# Patient Record
Sex: Male | Born: 1945
Health system: Southern US, Community
[De-identification: ages and names within clinical notes are randomized; demographics above are authoritative.]

## PROBLEM LIST (undated history)

## (undated) DIAGNOSIS — E785 Hyperlipidemia, unspecified: Secondary | ICD-10-CM

## (undated) DIAGNOSIS — F250 Schizoaffective disorder, bipolar type: Secondary | ICD-10-CM

## (undated) DIAGNOSIS — H359 Unspecified retinal disorder: Secondary | ICD-10-CM

## (undated) DIAGNOSIS — H269 Unspecified cataract: Secondary | ICD-10-CM

## (undated) DIAGNOSIS — I219 Acute myocardial infarction, unspecified: Secondary | ICD-10-CM

## (undated) DIAGNOSIS — Z87442 Personal history of urinary calculi: Secondary | ICD-10-CM

## (undated) HISTORY — PX: HEMORROIDECTOMY: SUR656

## (undated) HISTORY — DX: Schizoaffective disorder, bipolar type: F25.0

## (undated) HISTORY — PX: CARDIAC CATHETERIZATION: SHX172

## (undated) HISTORY — DX: Acute myocardial infarction, unspecified: I21.9

## (undated) HISTORY — DX: Hyperlipidemia, unspecified: E78.5

---

## 2001-10-22 ENCOUNTER — Encounter: Admission: RE | Admit: 2001-10-22 | Discharge: 2001-10-22 | Payer: Self-pay | Admitting: Family Medicine

## 2001-10-22 ENCOUNTER — Encounter: Payer: Self-pay | Admitting: Family Medicine

## 2002-01-14 ENCOUNTER — Encounter: Payer: Self-pay | Admitting: Emergency Medicine

## 2002-01-14 ENCOUNTER — Emergency Department (HOSPITAL_COMMUNITY): Admission: EM | Admit: 2002-01-14 | Discharge: 2002-01-14 | Payer: Self-pay | Admitting: Emergency Medicine

## 2002-01-14 IMAGING — CT CT HEAD W/O CM
1 series · 16 of 30 positions shown, 20 images · non-contrast
Comparison: none

FINDINGS
CLINICAL DATA: 56 YEAR OLD WITH BLURRED VISION. NO LOSS OF CONSCIOUSNESS.
CT HEAD, WITHOUT CONTRAST
AXIAL IMAGES ARE ACQUIRED THROUGH THE BRAIN AT 5 MM INCREMENTS WITHOUT CONTRAST.  THERE IS NO INTRA
OR EXTRA-AXIAL FLUID COLLECTION OR MASS.  BASILAR CISTERNS AND VENTRICLES HAVE NORMAL APPEARANCE.
THERE IS SUBTLE LOW ATTENUATION ADJACENT TO THE FRONTAL HORN OF THE RIGHT LATERAL VENTRICLE, WITHIN
THE FRONTAL LOBE, MEASURING LESS THAN 1 CM IN SIZE (IMAGES 9 AND 10).  THOUGH THE FINDINGS COULD BE
RELATED TO PERIVENTRICULAR WHITE MATTER CHANGE OF SMALL VESSEL DISEASE, THERE ARE A FEW OTHER
CHANGES ELSEWHERE TO SUPPORT SMALL VESSEL DISEASE.  IN LIGHT OF THIS ABNORMALITY, A CONTRAST
ENHANCED MRI MAY BE HELPFUL FOR FURTHER EVALUATION.
THERE ARE CHANGES OF CHRONIC SINUSITIS.
IMPRESSION
SUBTLE AREA OF LOW ATTENUATION IN THE RIGHT FRONTAL LOBE ADJACENT TO THE ANTERIOR HORN OF THE RIGHT
LATERAL VENTRICLE. THIS COULD REPRESENT CHANGE OF SMALL VESSEL DISEASE.  HOWEVER, MRI MAY BE
HELPFUL FOR FURTHER EVALUATION.

[Series 2: brain · axial · 0.47mm/px · z∈[+88,+229]mm · 16 of 30 slices shown, 20 images]
[im 2/30  brain]
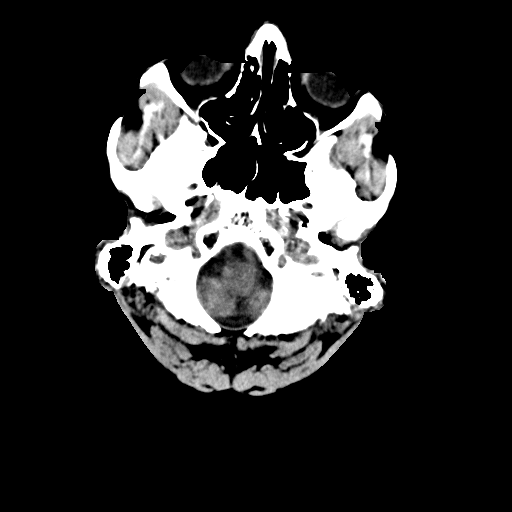
[im 2/30  bone]
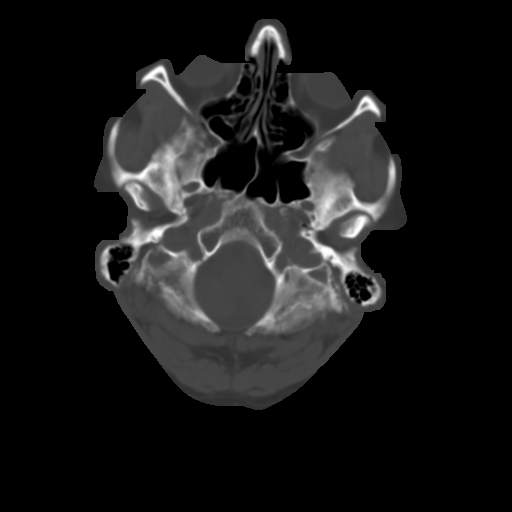
[im 4/30  brain]
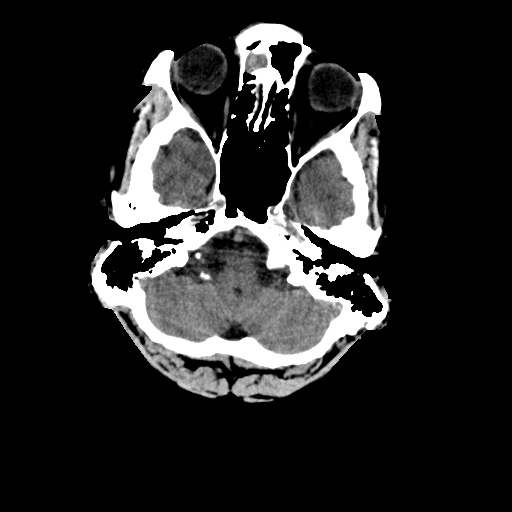
[im 6/30  brain]
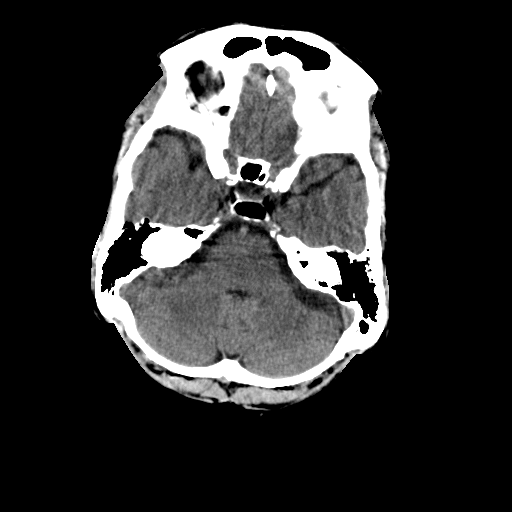
[im 8/30  brain]
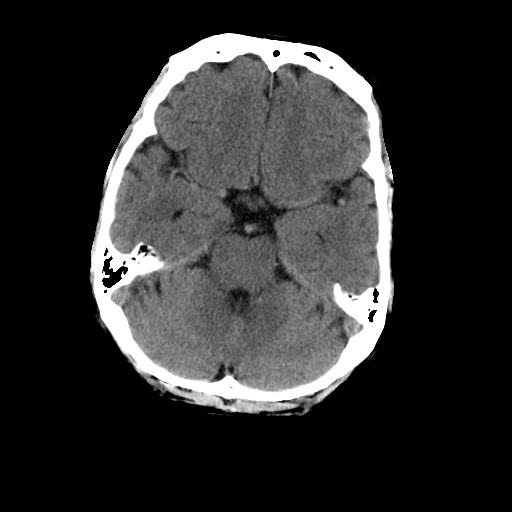
[im 9/30  brain]
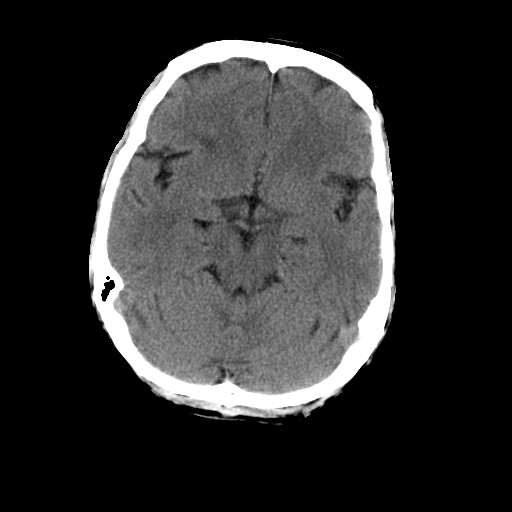
[im 9/30  bone]
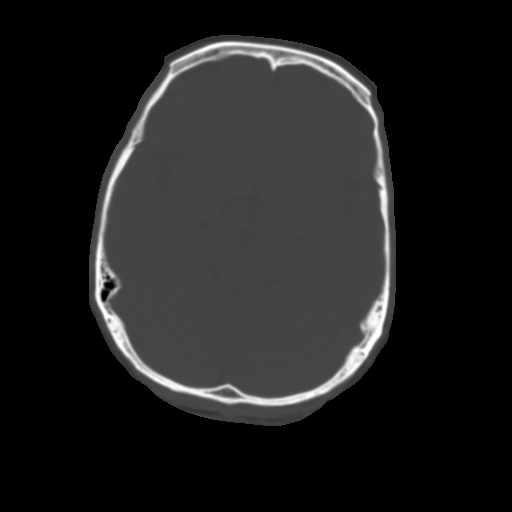
[im 11/30  brain]
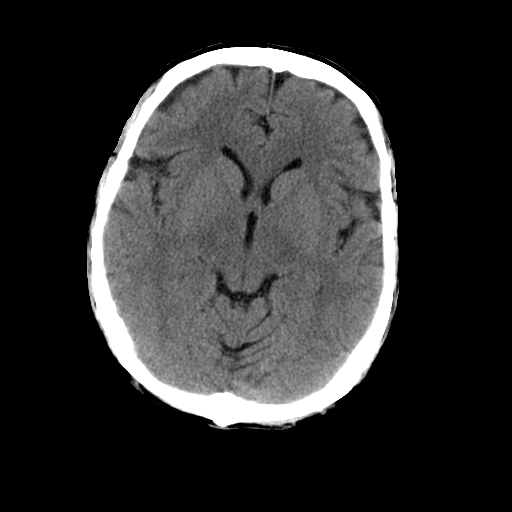
[im 13/30  brain]
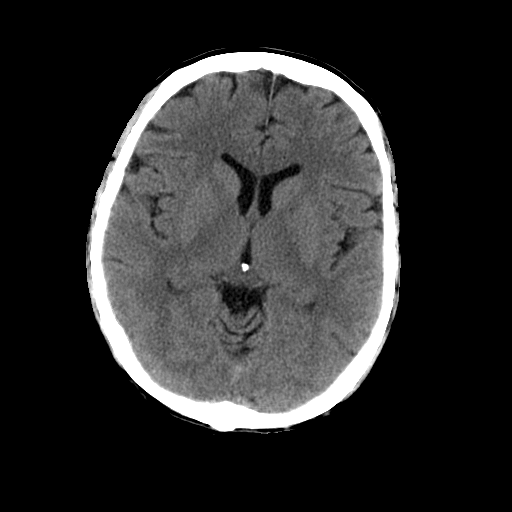
[im 15/30  brain]
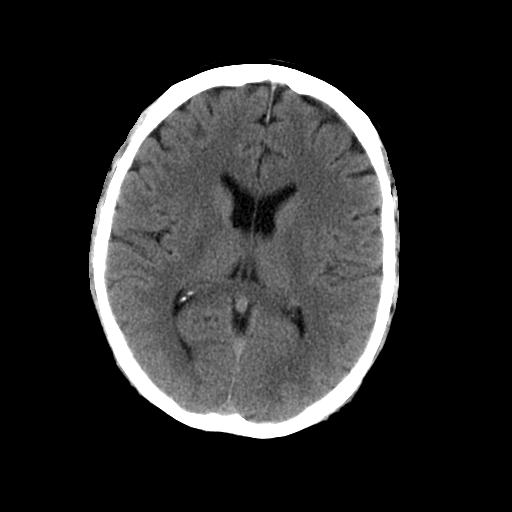
[im 16/30  brain]
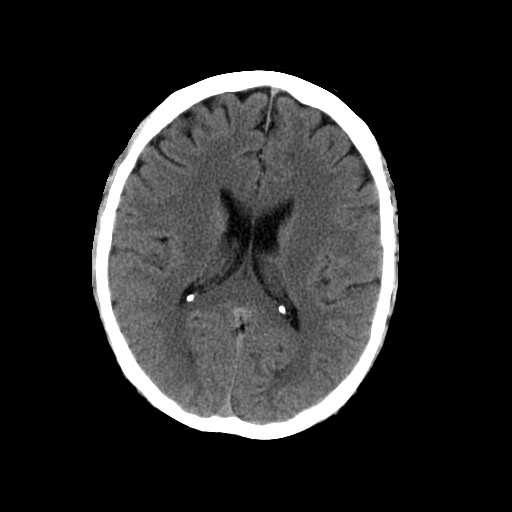
[im 16/30  bone]
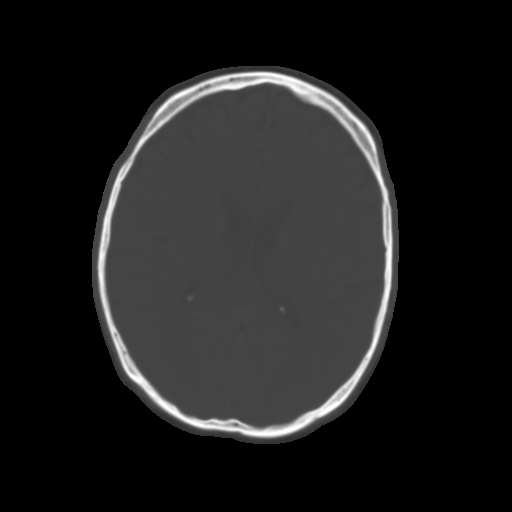
[im 18/30  brain]
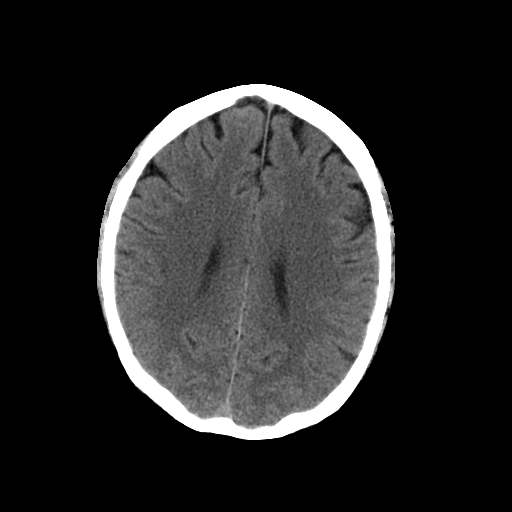
[im 20/30  brain]
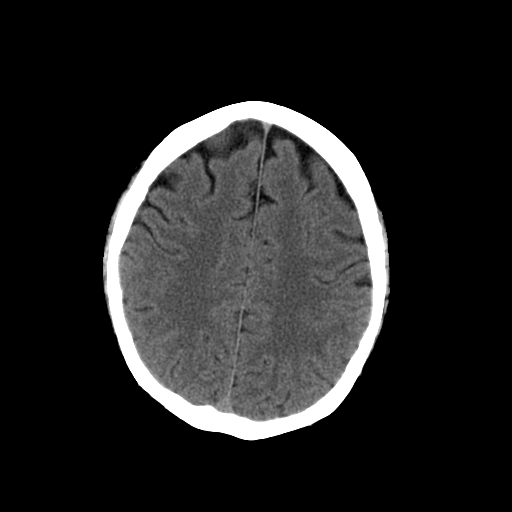
[im 22/30  brain]
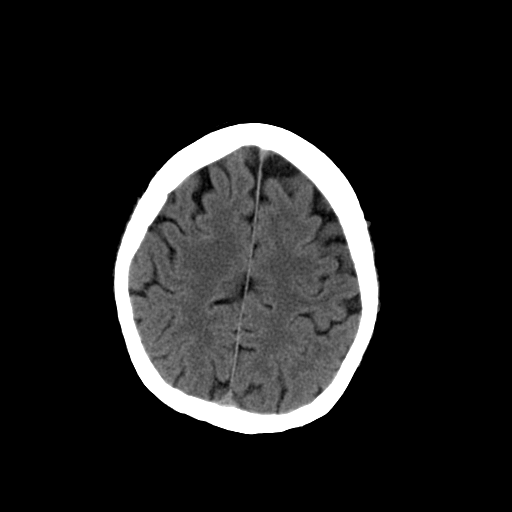
[im 23/30  brain]
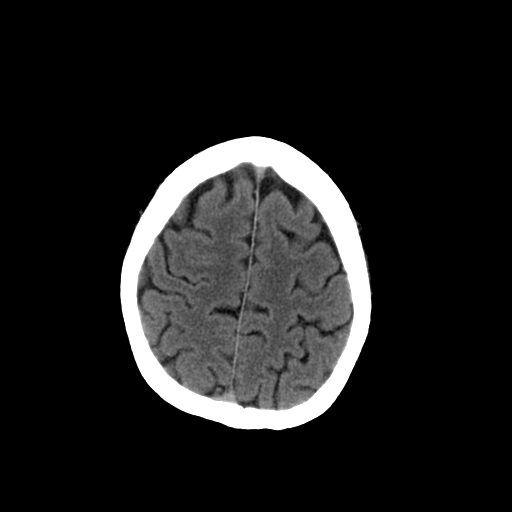
[im 23/30  bone]
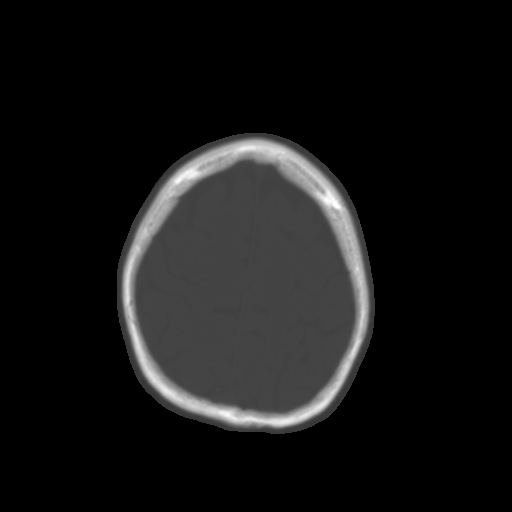
[im 25/30  brain]
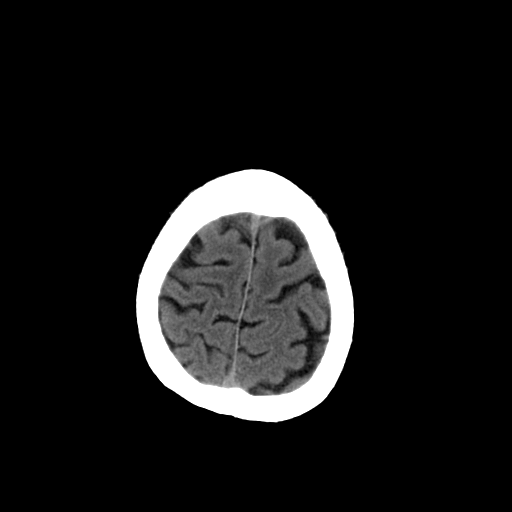
[im 27/30  brain]
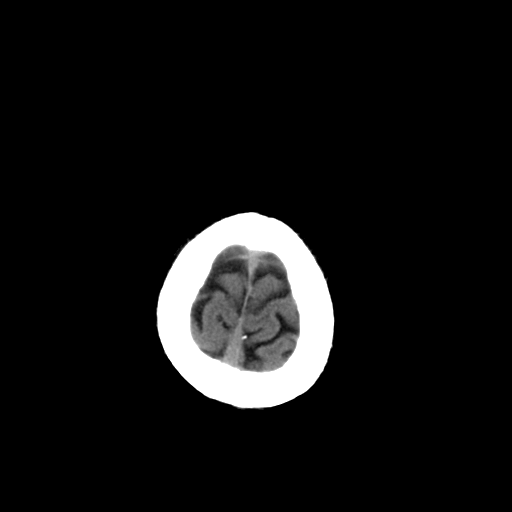
[im 29/30  brain]
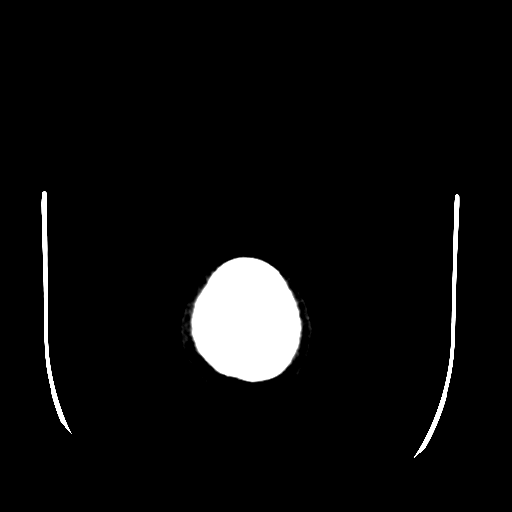

[16 of 30 positions shown; findings below may reference images not displayed]

## 2002-01-25 ENCOUNTER — Encounter: Payer: Self-pay | Admitting: Family Medicine

## 2002-01-25 ENCOUNTER — Encounter: Admission: RE | Admit: 2002-01-25 | Discharge: 2002-01-25 | Payer: Self-pay | Admitting: Family Medicine

## 2003-10-09 ENCOUNTER — Emergency Department (HOSPITAL_COMMUNITY): Admission: EM | Admit: 2003-10-09 | Discharge: 2003-10-09 | Payer: Self-pay | Admitting: Emergency Medicine

## 2003-10-09 IMAGING — CT CT HEAD W/O CM
1 of 2 series · 13 of 30 positions shown, 17 images · non-contrast
Comparison: none

CLINICAL DATA: 77-year-old, altered level of consciousness.
 NONCONTRAST CRANIAL CT
 This study was compared to previous study from [DATE].
 Intracranially, the ventricles are in the midline without mass effect or shift.  No extraaxial fluid collections are seen.  No CT evidence for acute intracranial abnormality.  No intracranial mass lesions.  In the right frontal white matter, there is a vague area of low attenuation which is more discrete than it was on the prior CT scan.  It is probably a lacunar type white matter infarct.  The bony calvarium is intact.  Visualized paranasal sinuses and mastoid air cells are clear.
 IMPRESSION 
 No acute intracranial findings.  Remote lacunar type infarct in the right frontal white matter.

[Series 2: brain · axial · 0.47mm/px · z∈[+121,+255]mm · 13 of 28 slices shown, 17 images]
[im 2/28  brain]
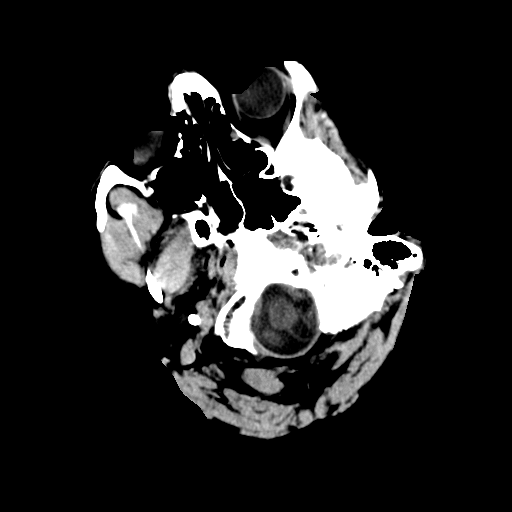
[im 2/28  bone]
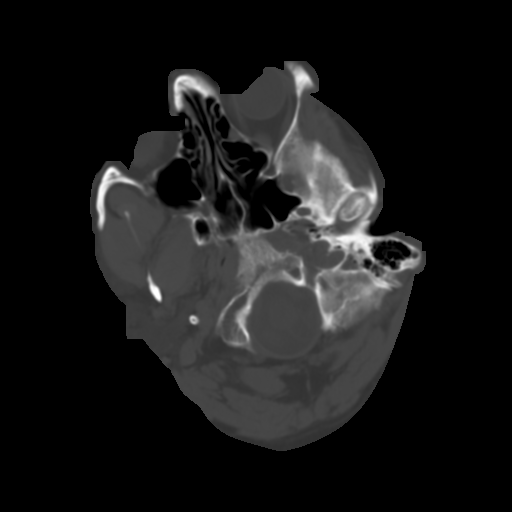
[im 4/28  brain]
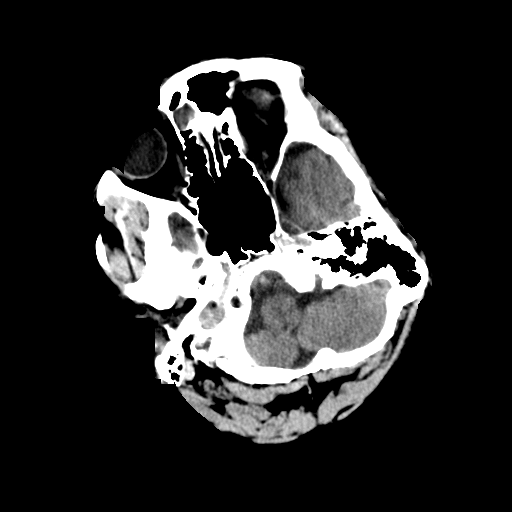
[im 6/28  brain]
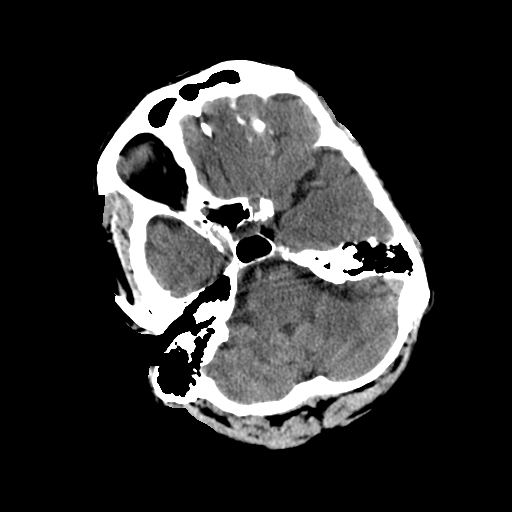
[im 8/28  brain]
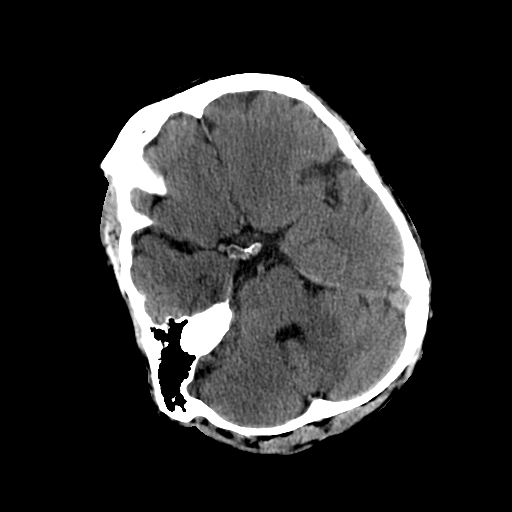
[im 10/28  brain]
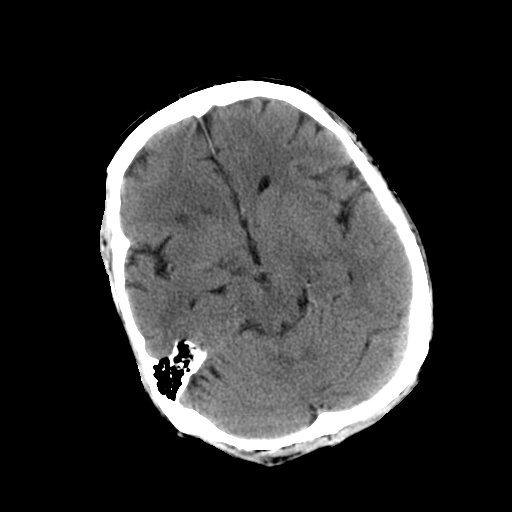
[im 10/28  bone]
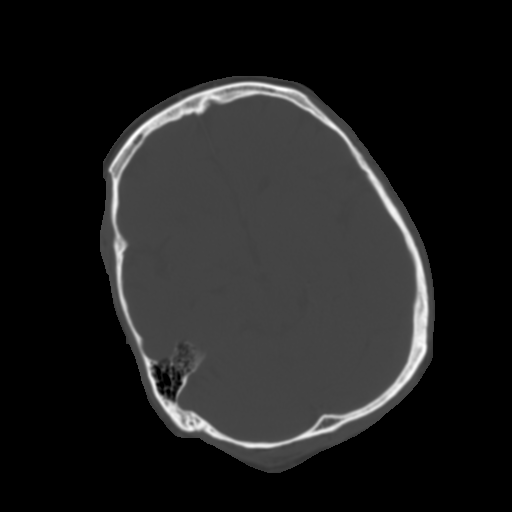
[im 12/28  brain]
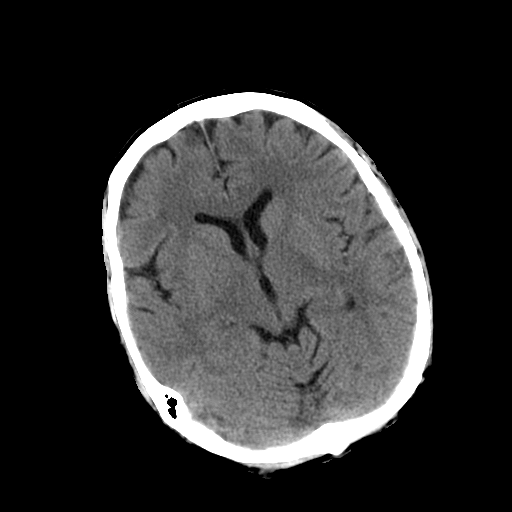
[im 14/28  brain]
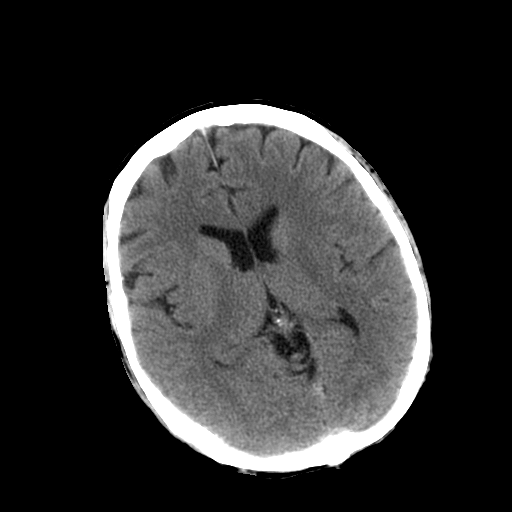
[im 16/28  brain]
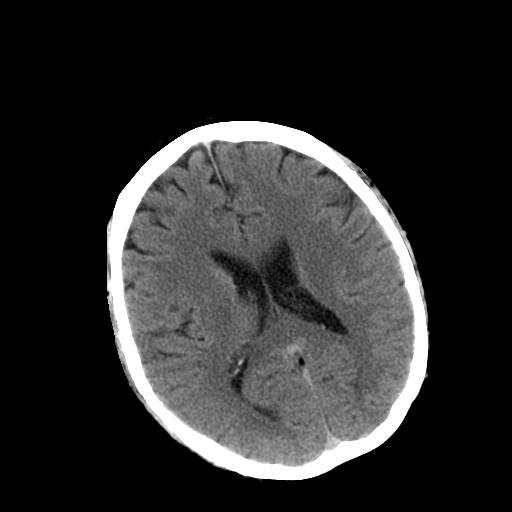
[im 18/28  brain]
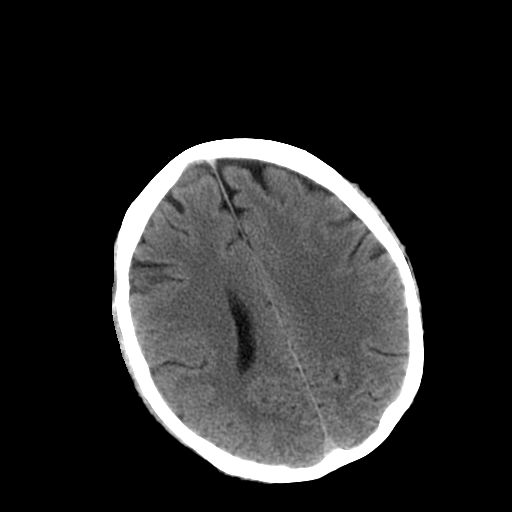
[im 18/28  bone]
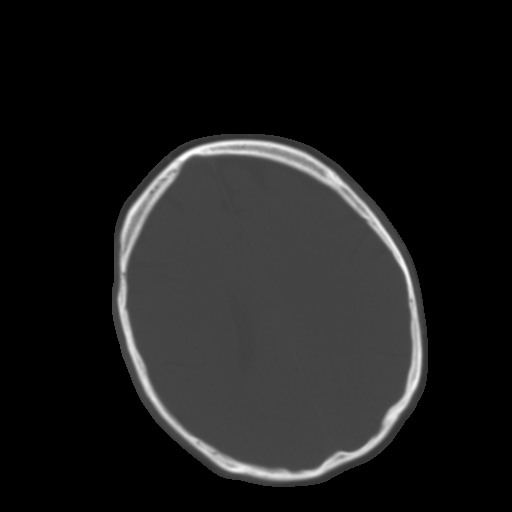
[im 20/28  brain]
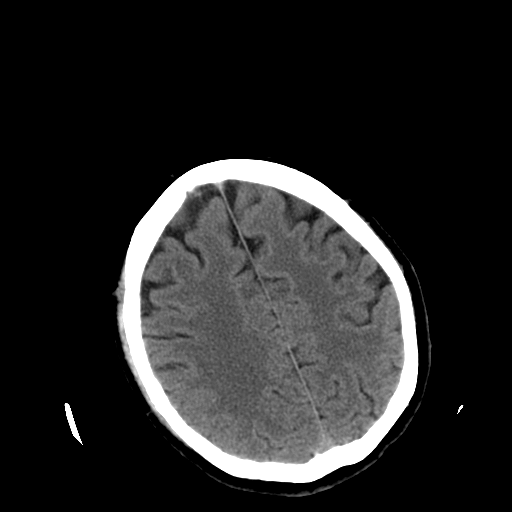
[im 22/28  brain]
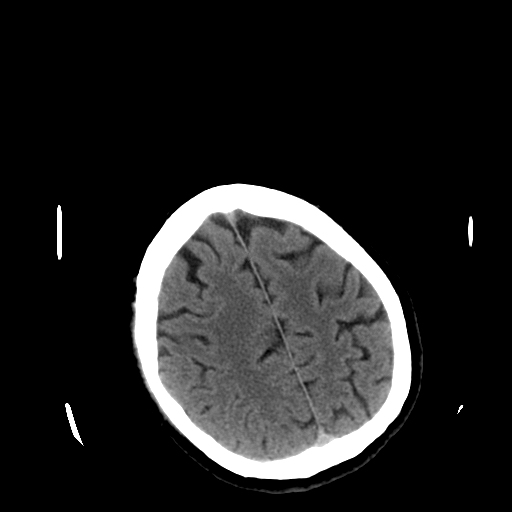
[im 24/28  brain]
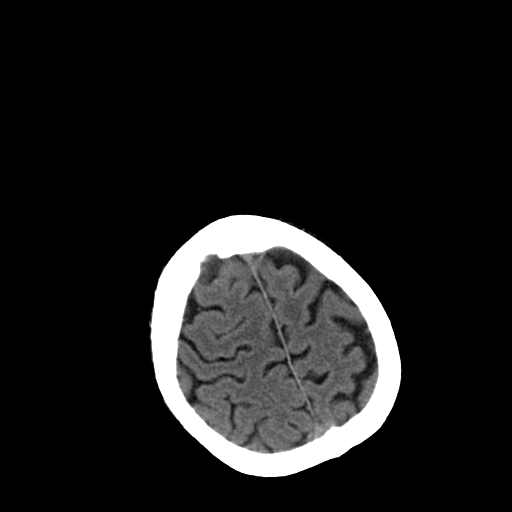
[im 26/28  brain]
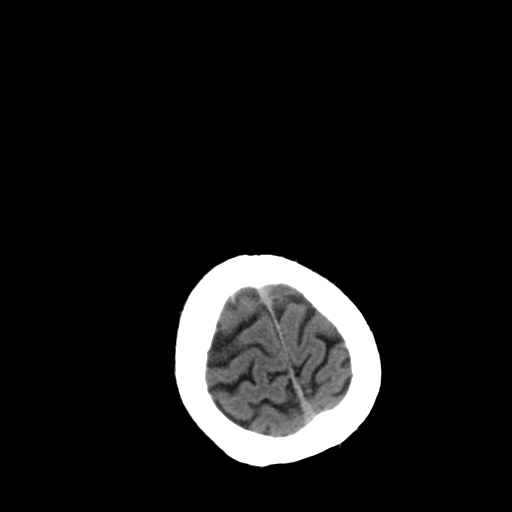
[im 26/28  bone]
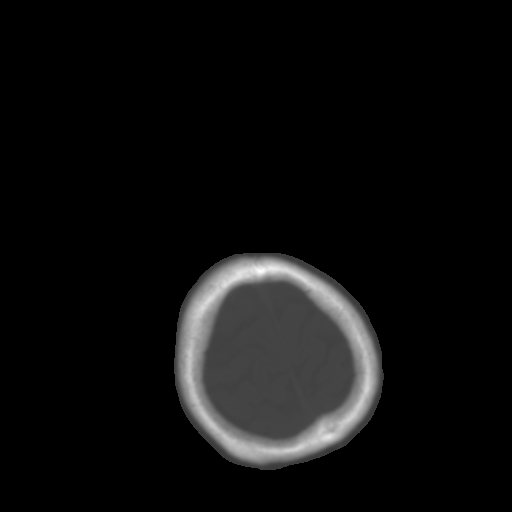

[13 of 30 positions shown; findings below may reference images not displayed]

## 2003-10-31 ENCOUNTER — Other Ambulatory Visit (HOSPITAL_COMMUNITY): Admission: RE | Admit: 2003-10-31 | Discharge: 2003-11-03 | Payer: Self-pay | Admitting: Psychiatry

## 2003-11-04 ENCOUNTER — Encounter: Admission: RE | Admit: 2003-11-04 | Discharge: 2003-11-04 | Payer: Self-pay

## 2004-04-23 ENCOUNTER — Ambulatory Visit (HOSPITAL_COMMUNITY): Payer: Self-pay | Admitting: Psychiatry

## 2004-06-04 ENCOUNTER — Ambulatory Visit (HOSPITAL_COMMUNITY): Admission: RE | Admit: 2004-06-04 | Discharge: 2004-06-04 | Payer: Self-pay | Admitting: Gastroenterology

## 2004-06-18 ENCOUNTER — Ambulatory Visit (HOSPITAL_COMMUNITY): Payer: Self-pay | Admitting: Psychiatry

## 2004-08-20 ENCOUNTER — Ambulatory Visit (HOSPITAL_COMMUNITY): Payer: Self-pay | Admitting: Psychiatry

## 2004-11-12 ENCOUNTER — Ambulatory Visit (HOSPITAL_COMMUNITY): Payer: Self-pay | Admitting: Psychiatry

## 2005-02-11 ENCOUNTER — Ambulatory Visit (HOSPITAL_COMMUNITY): Payer: Self-pay | Admitting: Psychiatry

## 2005-05-13 ENCOUNTER — Ambulatory Visit (HOSPITAL_COMMUNITY): Payer: Self-pay | Admitting: Psychiatry

## 2005-08-12 ENCOUNTER — Ambulatory Visit (HOSPITAL_COMMUNITY): Payer: Self-pay | Admitting: Psychiatry

## 2005-11-11 ENCOUNTER — Ambulatory Visit (HOSPITAL_COMMUNITY): Payer: Self-pay | Admitting: Psychiatry

## 2006-02-10 ENCOUNTER — Ambulatory Visit (HOSPITAL_COMMUNITY): Payer: Self-pay | Admitting: Psychiatry

## 2006-05-12 ENCOUNTER — Ambulatory Visit (HOSPITAL_COMMUNITY): Payer: Self-pay | Admitting: Psychiatry

## 2006-08-04 ENCOUNTER — Ambulatory Visit (HOSPITAL_COMMUNITY): Payer: Self-pay | Admitting: Psychiatry

## 2006-12-15 ENCOUNTER — Ambulatory Visit (HOSPITAL_COMMUNITY): Payer: Self-pay | Admitting: Psychiatry

## 2007-04-13 ENCOUNTER — Ambulatory Visit (HOSPITAL_COMMUNITY): Payer: Self-pay | Admitting: Psychiatry

## 2007-08-03 ENCOUNTER — Ambulatory Visit (HOSPITAL_COMMUNITY): Payer: Self-pay | Admitting: Psychiatry

## 2008-01-11 ENCOUNTER — Ambulatory Visit (HOSPITAL_COMMUNITY): Payer: Self-pay | Admitting: Psychiatry

## 2008-07-06 ENCOUNTER — Ambulatory Visit (HOSPITAL_COMMUNITY): Payer: Self-pay | Admitting: Psychiatry

## 2008-12-20 ENCOUNTER — Ambulatory Visit (HOSPITAL_COMMUNITY): Payer: Self-pay | Admitting: Psychiatry

## 2009-06-06 ENCOUNTER — Ambulatory Visit (HOSPITAL_COMMUNITY): Payer: Self-pay | Admitting: Psychiatry

## 2009-11-28 ENCOUNTER — Ambulatory Visit (HOSPITAL_COMMUNITY): Payer: Self-pay | Admitting: Psychiatry

## 2010-05-22 ENCOUNTER — Ambulatory Visit (HOSPITAL_COMMUNITY): Payer: Self-pay | Admitting: Psychiatry

## 2010-09-26 ENCOUNTER — Encounter (HOSPITAL_COMMUNITY): Payer: BC Managed Care – PPO | Admitting: Physician Assistant

## 2010-09-26 DIAGNOSIS — F259 Schizoaffective disorder, unspecified: Secondary | ICD-10-CM

## 2010-10-30 ENCOUNTER — Encounter (HOSPITAL_COMMUNITY): Payer: Self-pay | Admitting: Physician Assistant

## 2010-11-23 ENCOUNTER — Encounter (HOSPITAL_COMMUNITY): Payer: BC Managed Care – PPO | Admitting: Physician Assistant

## 2010-11-23 DIAGNOSIS — F259 Schizoaffective disorder, unspecified: Secondary | ICD-10-CM

## 2010-12-14 NOTE — Op Note (Signed)
NAMEKAMARII, CARTON             ACCOUNT NO.:  [id number]   MEDICAL RECORD NO.:  [id number]          PATIENT TYPE:  AMB   LOCATION:  ENDO                         FACILITY:  [facility]   PHYSICIAN:  Danise Edge, M.D.   DATE OF BIRTH:  10/09/1945   DATE OF PROCEDURE:  06/04/2004  DATE OF DISCHARGE:                                 OPERATIVE REPORT   PROCEDURE:  Screening colonoscopy.   PROCEDURE INDICATION:  Mr. Holton Sidman is a 65 year old male born Nov 27, 1945.  Mr. Bucklin father was diagnosed with colon cancer in his mid-60s.  Mr. Tibbs is scheduled to undergo a screening colonoscopy with  polypectomy to prevent colon cancer.   ENDOSCOPIST:  Danise Edge, M.D.   PREMEDICATION:  Versed 7 mg, Demerol 70 mg.   PROCEDURE:  After obtaining informed consent, Mr. Casebolt was placed in the  left lateral decubitus position.  I administered intravenous Demerol and  intravenous Versed to achieve conscious sedation for the procedure.  The  patient's blood pressure, oxygen saturation, and cardiac rhythm were  monitored throughout the procedure and documented in the medical record.   Anal inspection and digital rectal exam were normal.  The prostate was non-  nodular.  The Olympus adjustable pediatric colonoscope was introduced into  the rectum and advanced to the cecum.  A normal-appearing ileocecal valve  was intubated and the distal ileum inspected.  Colonic preparation for the  exam today was excellent.   Rectum normal.   Sigmoid colon and descending colon normal.   Splenic flexure normal.   Transverse colon normal.   Hepatic flexure normal.   Ascending colon normal.   Cecum and ileocecal valve normal.   Distal ileum normal.   ASSESSMENT:  Normal proctocolonoscopy to the cecum.  No endoscopic evidence  for the presence of colorectal neoplasia.   RECOMMENDATIONS:  Repeat colonoscopy in five years.      MJ/MEDQ  D:  06/04/2004  T:  06/04/2004  Job:   [id number]

## 2011-04-03 ENCOUNTER — Ambulatory Visit (INDEPENDENT_AMBULATORY_CARE_PROVIDER_SITE_OTHER): Payer: Medicare Other | Admitting: Ophthalmology

## 2011-04-03 DIAGNOSIS — H43819 Vitreous degeneration, unspecified eye: Secondary | ICD-10-CM

## 2011-04-03 DIAGNOSIS — H33309 Unspecified retinal break, unspecified eye: Secondary | ICD-10-CM

## 2011-04-03 DIAGNOSIS — H2510 Age-related nuclear cataract, unspecified eye: Secondary | ICD-10-CM

## 2011-05-28 ENCOUNTER — Encounter (INDEPENDENT_AMBULATORY_CARE_PROVIDER_SITE_OTHER): Payer: Medicare Other | Admitting: Physician Assistant

## 2011-05-28 DIAGNOSIS — F259 Schizoaffective disorder, unspecified: Secondary | ICD-10-CM

## 2011-06-05 ENCOUNTER — Other Ambulatory Visit (HOSPITAL_COMMUNITY): Payer: Self-pay | Admitting: *Deleted

## 2011-06-05 MED ORDER — SERTRALINE HCL 100 MG PO TABS: 100.0000 mg | ORAL_TABLET | Freq: Every day | ORAL | Status: DC

## 2011-07-03 ENCOUNTER — Other Ambulatory Visit (HOSPITAL_COMMUNITY): Payer: Self-pay | Admitting: Physician Assistant

## 2011-07-03 DIAGNOSIS — F259 Schizoaffective disorder, unspecified: Secondary | ICD-10-CM

## 2011-07-04 ENCOUNTER — Other Ambulatory Visit (HOSPITAL_COMMUNITY): Payer: Self-pay | Admitting: Physician Assistant

## 2011-07-04 DIAGNOSIS — F259 Schizoaffective disorder, unspecified: Secondary | ICD-10-CM

## 2011-07-04 MED ORDER — RISPERIDONE 2 MG PO TABS: 2.0000 mg | ORAL_TABLET | Freq: Every day | ORAL | Status: DC

## 2011-08-02 ENCOUNTER — Encounter (INDEPENDENT_AMBULATORY_CARE_PROVIDER_SITE_OTHER): Payer: Medicare Other | Admitting: Ophthalmology

## 2011-08-02 DIAGNOSIS — H2510 Age-related nuclear cataract, unspecified eye: Secondary | ICD-10-CM

## 2011-08-02 DIAGNOSIS — H33309 Unspecified retinal break, unspecified eye: Secondary | ICD-10-CM

## 2011-08-02 DIAGNOSIS — H43819 Vitreous degeneration, unspecified eye: Secondary | ICD-10-CM

## 2011-08-02 DIAGNOSIS — Q143 Congenital malformation of choroid: Secondary | ICD-10-CM

## 2011-09-06 ENCOUNTER — Other Ambulatory Visit (HOSPITAL_COMMUNITY): Payer: Self-pay | Admitting: Physician Assistant

## 2011-11-19 ENCOUNTER — Ambulatory Visit (INDEPENDENT_AMBULATORY_CARE_PROVIDER_SITE_OTHER): Payer: Medicare Other | Admitting: Physician Assistant

## 2011-11-19 DIAGNOSIS — F259 Schizoaffective disorder, unspecified: Secondary | ICD-10-CM

## 2011-11-20 DIAGNOSIS — K219 Gastro-esophageal reflux disease without esophagitis: Secondary | ICD-10-CM | POA: Insufficient documentation

## 2011-11-20 NOTE — Progress Notes (Signed)
   [facility]al [facility] Follow-up Outpatient Visit  JAELAN RASHEED 04/15/1946  Date: 11/19/2011   Subjective: Brandon Oliver presents today to followup on his medications prescribed for schizoaffective disorder. He reports that he has been doing very well. He is enjoying his retirement. His mood has been stable. He is sleeping well and his appetite is good. He is spending a lot of his time visiting church members who live in assisted living facilities. He is also taking trips out of state to visit family members. He has begun to experience an increase in GERD, for which he takes Zantac and Tums. He denies any suicidal or homicidal ideation. He denies any auditory or visual hallucinations.  There were no vitals filed for this visit.  Mental Status Examination  Appearance: Well groomed and dressed Alert: Yes Attention: good  Cooperative: Yes Eye Contact: Good Speech: Clear and even Psychomotor Activity: Normal Memory/Concentration: Intact Oriented: person, place, time/date and situation Mood: Euthymic Affect: Blunt Thought Processes and Associations: Linear Fund of Knowledge: Good Thought Content: Normal Insight: Good Judgement: Good  Diagnosis: Schizoaffective disorder  Treatment Plan: We will continue his Risperdal 2 mg at bedtime and Zoloft 100 mg at bedtime. He will followup in 6 months.  Nadir Vasques, PA-C

## 2011-12-02 ENCOUNTER — Other Ambulatory Visit (HOSPITAL_COMMUNITY): Payer: Self-pay | Admitting: Physician Assistant

## 2011-12-30 ENCOUNTER — Other Ambulatory Visit (HOSPITAL_COMMUNITY): Payer: Self-pay | Admitting: Physician Assistant

## 2011-12-30 DIAGNOSIS — F259 Schizoaffective disorder, unspecified: Secondary | ICD-10-CM

## 2012-03-03 ENCOUNTER — Other Ambulatory Visit (HOSPITAL_COMMUNITY): Payer: Self-pay | Admitting: Physician Assistant

## 2012-04-03 ENCOUNTER — Encounter (INDEPENDENT_AMBULATORY_CARE_PROVIDER_SITE_OTHER): Payer: Medicare Other | Admitting: Ophthalmology

## 2012-04-03 DIAGNOSIS — H33309 Unspecified retinal break, unspecified eye: Secondary | ICD-10-CM

## 2012-04-03 DIAGNOSIS — H2510 Age-related nuclear cataract, unspecified eye: Secondary | ICD-10-CM

## 2012-04-03 DIAGNOSIS — Q143 Congenital malformation of choroid: Secondary | ICD-10-CM

## 2012-04-03 DIAGNOSIS — H43819 Vitreous degeneration, unspecified eye: Secondary | ICD-10-CM

## 2012-05-20 ENCOUNTER — Ambulatory Visit (INDEPENDENT_AMBULATORY_CARE_PROVIDER_SITE_OTHER): Payer: Medicare Other | Admitting: Physician Assistant

## 2012-05-20 DIAGNOSIS — F259 Schizoaffective disorder, unspecified: Secondary | ICD-10-CM | POA: Insufficient documentation

## 2012-05-20 NOTE — Progress Notes (Signed)
   [facility] Behavioral Health Follow-up Outpatient Visit  UTSAV KAEHLER Dec 01, 1945  Date: 05/20/2012   Subjective: Alinda Money presents today to followup on his treatment for her schizoaffective disorder. He reports that he is enjoying his retirement. He visits people in assisted living facilities on a daily basis, and helps a friend of his from church to make her doctor appointments. He also is walking approximately 5 days a week at the [employer]. He endorses a stable mood. He reports that his sleep and appetite are good. He denies any suicidal or homicidal ideation. He denies any auditory or visual hallucinations.  There were no vitals filed for this visit.  Mental Status Examination  Appearance: Well groomed and casually dressed Alert: Yes Attention: good  Cooperative: Yes Eye Contact: Good Speech: Clear and coherent Psychomotor Activity: Normal Memory/Concentration: Intact Oriented: person, place, time/date and situation Mood: Euthymic Affect: Appropriate Thought Processes and Associations: Linear Fund of Knowledge: Good Thought Content: Normal Insight: Good Judgement: Good  Diagnosis: Schizoaffective disorder  Treatment Plan: We will continue his Risperdal 2 mg at bedtime, and Zoloft 100 mg daily. He'll return for followup in 6 months.  Gisselle Galvis, PA-C

## 2012-05-30 ENCOUNTER — Other Ambulatory Visit (HOSPITAL_COMMUNITY): Payer: Self-pay | Admitting: Physician Assistant

## 2012-09-08 ENCOUNTER — Encounter (INDEPENDENT_AMBULATORY_CARE_PROVIDER_SITE_OTHER): Payer: Self-pay | Admitting: General Surgery

## 2012-09-08 ENCOUNTER — Ambulatory Visit (INDEPENDENT_AMBULATORY_CARE_PROVIDER_SITE_OTHER): Payer: Medicare Other | Admitting: General Surgery

## 2012-09-08 VITALS — BP 134/62 | HR 76 | Temp 97.3°F | Resp 16 | Ht 69.25 in | Wt 186.4 lb

## 2012-09-08 DIAGNOSIS — L290 Pruritus ani: Secondary | ICD-10-CM

## 2012-09-08 NOTE — Progress Notes (Signed)
Chief Complaint  Patient presents with  . New Evaluation    eval recurrent hems    HISTORY: Brandon Oliver is a 67 y.o. male who presents to the office with rectal bleeding.  He has a recent h/o constipation. Other symptoms include itching and a mass sensation.  This had been occurring for several years.  He has tried suppositories in the past with some success.  Straining makes the symptoms worse.   It is intermittent in nature.  His bowel habits are regular and his bowel movements are usually soft.  His fiber intake is minimal.  His last colonoscopy was in Dec 2010 by [facility], and he states he is due in 10 years.    Hemorrhoidectomy in 1991 in [place].  Past Medical History  Diagnosis Date  . Hyperlipidemia   . Myocardial infarction     Stents x2  Past Surgical History  Procedure Laterality Date  . Hemorroidectomy          Current Outpatient Prescriptions  Medication Sig Dispense Refill  . aspirin 81 MG tablet Take 81 mg by mouth daily.      . carvedilol (COREG) 3.125 MG tablet       . risperiDONE (RISPERDAL) 2 MG tablet TAKE 1 TABLET AT BEDTIME  90 tablet  1  . sertraline (ZOLOFT) 100 MG tablet TAKE 1 TABLET AT BEDTIME  90 tablet  1  . simvastatin (ZOCOR) 40 MG tablet        No current facility-administered medications for this visit.      Allergies  Allergen Reactions  . Penicillins       Family History  Problem Relation Age of Onset  . Cancer Mother     breast  . Cancer Father     colon  . Cancer Maternal Aunt     pancreatic  . Cancer Paternal Aunt     "male" cancer  . Cancer Paternal Uncle     pancreatic  . Cancer Paternal Grandfather     colon  . Cancer Paternal Uncle     lung    History   Social History  . Marital Status: Single    Spouse Name: N/A    Number of Children: N/A  . Years of Education: N/A   Social History Main Topics  . Smoking status: Never Smoker   . Smokeless tobacco: Never Used  . Alcohol Use: No  . Drug Use: No  .  Sexually Active: None   Other Topics Concern  . None   Social History Narrative  . None      REVIEW OF SYSTEMS - PERTINENT POSITIVES ONLY: Review of Systems - General ROS: negative for - chills, fever or weight loss Hematological and Lymphatic ROS: negative for - bleeding problems, blood clots or bruising Respiratory ROS: no cough, shortness of breath, or wheezing Cardiovascular ROS: no chest pain or dyspnea on exertion Gastrointestinal ROS: positive for - blood in stools negative for - abdominal pain Genito-Urinary ROS: no dysuria, trouble voiding, or hematuria  EXAM: Filed Vitals:   09/08/12 1022  BP: 134/62  Pulse: 76  Temp: 97.3 F (36.3 C)  Resp: 16    General appearance: alert and cooperative Resp: clear to auscultation bilaterally Cardio: regular rate and rhythm GI: soft, non-tender; bowel sounds normal; no masses,  no organomegaly   Procedure: Anoscopy Surgeon: Brandon Oliver Diagnosis: rectal bleeding  Assistant: Brandon Oliver After the risks and benefits were explained, verbal consent was obtained for above procedure  Anesthesia: none  Findings: minimal hemorrhoid disease, no fissures or fistulas, large amt of perianal skin irritation and some fecal leakage noted as well, left lateral defect and assumed place of prior hemorrhoidectomy    ASSESSMENT AND PLAN: Brandon Oliver is a 67 y.o. M who was referred to me for rectal bleeding.  I see no active hemorrhoid disease at this time.  He does appear to have pruritis ani.  I am not sure why he is having anal bleeding at this time.  We will treat the pruritis ani for now and have him continue his anusol.  I will see him back in 6 weeks for re-evaluation.  If he is still having bleeding and no identifiable anal source, he may need a repeat colonoscopy to evaluate his colon further.      Vanita Panda, MD Colon and Rectal Surgery / General Surgery [facility]      Visit Diagnoses: No diagnosis  found.  Primary Care Physician: Berenda Morale, MD

## 2012-09-08 NOTE — Patient Instructions (Signed)
Pruritus Ani  What is Pruritus Ani (proo-r-tus a-n)? Itching around the anal area is called pruritus ani. This condition results in a compelling urge to scratch. What causes this to happen? Several factors may be at fault. A common cause is excessive moisture in the anal area. Moisture may be due to perspiration or a small amount of residual stool around the anal area. Pruritis ani may be a symptom of other common anal conditions such as hemorrhoids and anal fissures. The initial condition can be made worse by scratching, vigorous cleansing of the area or overuse of topical treatments. In some individuals pruritus ani may be caused by eating certain foods, smoking and drinking alcoholic beverages, especially beer and wine. Food items that have been associated with pruritus ani include: . Coffee, Tea . Carbonated beverages . Milk products . Tomatoes and tomato products such as Ketchup . Cheese . Chocolate . Nuts Does Pruritus Ani result from lack of cleanliness? Cleanliness is almost never a factor. However, the natural tendency once a person develops this itching is to wash the area vigorously and frequently with soap and a washcloth. This almost always makes the problem worse by damaging the skin and washing away protective natural oils. What can be done to make this itching go away? A careful examination by a colon and rectal surgeon or other physician may identify a definite cause for the itching. Your physician can recommend treatment to eliminate the specific problem. Treatment of pruritus ani may include these three points. 1. AVOID MOISTURE in the anal area: . Apply either a few wisps of cotton, a 4 x 4 gauze or some cornstarch powder to keep the area dry. . Avoid all medicated, perfumed and deodorant powders. 2. AVOID FURTHER TRAUMA to the affected area: Marland Kitchen Do not use soap of any kind on the anal area. . Do not scrub the anal area with anything - even toilet paper. . For hygiene, it  is best to rinse with warm water and pat the area dry. Use wet toilet paper, baby wipes or a wet washcloth to blot the area clean. Never rub. . Try not to scratch the itchy area. Scratching produces more damage, which in turn makes the itching worse. For individuals that experience irresistible itching at night, wearing socks on the hands may be helpful. 3. USE ONLY MEDICATIONS AS DIRECTED BY YOUR PHYSICIAN. Apply prescription medications sparingly to the skin around the anal area and avoid rubbing. Prolonged use of prescribed or over the counter topical medications may result in irritation or skin dryness that can make the condition worse. How long does this treatment usually take? Most people experience some relief from itching within a week. If symptoms do not resolve after 6 weeks, a follow-up appointment with your colon and rectal surgeon may be needed.               2012 American Society of Colon [employer] Rectal [employer]

## 2012-10-20 ENCOUNTER — Encounter (INDEPENDENT_AMBULATORY_CARE_PROVIDER_SITE_OTHER): Payer: Self-pay | Admitting: General Surgery

## 2012-10-20 ENCOUNTER — Ambulatory Visit (INDEPENDENT_AMBULATORY_CARE_PROVIDER_SITE_OTHER): Payer: Medicare Other | Admitting: General Surgery

## 2012-10-20 VITALS — BP 120/82 | HR 78 | Temp 97.0°F | Resp 12 | Wt 184.0 lb

## 2012-10-20 DIAGNOSIS — K6289 Other specified diseases of anus and rectum: Secondary | ICD-10-CM

## 2012-10-20 MED ORDER — CLOTRIMAZOLE-BETAMETHASONE 1-0.05 % EX LOTN: TOPICAL_LOTION | Freq: Two times a day (BID) | CUTANEOUS | Status: DC

## 2012-10-20 NOTE — Patient Instructions (Signed)
Return to the office in 1 month 

## 2012-10-20 NOTE — Progress Notes (Signed)
Brandon Oliver is a 67 y.o. male who is here for a follow up visit regarding his anal bleeding.  This has gotten much better with the fiber addition to his diet.  He has occasional minimal bleeding with BM's  HISTORY: Brandon Oliver is a 67 y.o. male who presents to the office with rectal bleeding. He has a recent h/o constipation. Other symptoms include itching and a mass sensation. This had been occurring for several years. He has tried suppositories in the past with some success. Straining makes the symptoms worse. It is intermittent in nature. His bowel habits are regular and his bowel movements are usually soft. His fiber intake is minimal. His last colonoscopy was in Dec 2010 by [facility], and he states he is due in 10 years. Hemorrhoidectomy in 1991 in [place].  Objective: Filed Vitals:   10/20/12 1223  BP: 120/82  Pulse: 78  Temp: 97 F (36.1 C)  Resp: 12    General appearance: alert and cooperative GI: soft, non-tender; bowel sounds normal; no masses,  no organomegaly Perianal- still with fecal leakage and skin irritation  Assessment and Plan: Brandon Oliver is here for f/u of his rectal bleeding.  This appears to be doing better, but he is still having some fecal leakage and he has a large region of anal irritation.  I suspect his skin irritation is from his fecal leakage.  I have prescribed him an antifungal cream to try, and I will see him back in 4 weeks.      Vanita Panda, MD [facility], [place] [phone]

## 2012-11-18 ENCOUNTER — Ambulatory Visit (INDEPENDENT_AMBULATORY_CARE_PROVIDER_SITE_OTHER): Payer: Medicare Other | Admitting: Physician Assistant

## 2012-11-18 ENCOUNTER — Encounter (INDEPENDENT_AMBULATORY_CARE_PROVIDER_SITE_OTHER): Payer: Self-pay | Admitting: General Surgery

## 2012-11-18 ENCOUNTER — Ambulatory Visit (INDEPENDENT_AMBULATORY_CARE_PROVIDER_SITE_OTHER): Payer: Medicare Other | Admitting: General Surgery

## 2012-11-18 VITALS — BP 130/78 | HR 66 | Temp 97.4°F | Resp 18 | Wt 183.0 lb

## 2012-11-18 DIAGNOSIS — F259 Schizoaffective disorder, unspecified: Secondary | ICD-10-CM

## 2012-11-18 DIAGNOSIS — L290 Pruritus ani: Secondary | ICD-10-CM

## 2012-11-18 NOTE — Progress Notes (Signed)
   [facility] Behavioral Health Follow-up Outpatient Visit  BATU CASSIN 1946-02-21  Date: 11/18/1012   Subjective: Alinda Money presents today to followup on his treatment for schizoaffective disorder. He reports that he is doing well. He states that his mood has been stable, and he denies any psychosis. He continues to enjoy retirement, and reports that he spends a lot of his time visiting fellow church members who are in nursing homes. He also has started to hang out at a local coffee shop where he is making friends. He has not been walking as much she had been because he fell in his bathtub and hurt his back. He reports that when he stands up quickly he gets lightheaded. He reports that his sleep is good, and his appetite is fine.  There were no vitals filed for this visit.  Mental Status Examination  Appearance: Casual Alert: Yes Attention: good  Cooperative: Yes Eye Contact: Good Speech: Clear and coherent Psychomotor Activity: Normal Memory/Concentration: Intact Oriented: person, place, time/date and situation Mood: Euthymic Affect: Congruent Thought Processes and Associations: Logical Fund of Knowledge: Good Thought Content: Normal Insight: Good Judgement: Good  Diagnosis: Schizoaffective disorder  Treatment Plan: We will continue his Zoloft 100 mg daily, and Risperdal 2 mg at bedtime. He will return for followup in 6 months.  Kamilya Wakeman, PA-C

## 2012-11-18 NOTE — Progress Notes (Signed)
Brandon Oliver is a 67 y.o. male who is here for a follow up visit regarding his anal bleeding. This has gotten better with the fiber addition to his diet. He has occasional minimal bleeding with BM's.  His itching is better with his antifungal cream  HISTORY: Brandon Oliver is a 67 y.o. male who presents to the office with rectal bleeding. He has a recent h/o constipation. Other symptoms include itching and a mass sensation. This had been occurring for several years. He has tried suppositories in the past with some success. Straining makes the symptoms worse. It is intermittent in nature. His bowel habits are regular and his bowel movements are usually soft. His fiber intake is minimal. His last colonoscopy was in Dec 2010 by [facility], and he states he is due in 10 years. Hemorrhoidectomy in 1991 in [place].  Objective:  Filed Vitals:   11/18/12 1618  BP: 130/78  Pulse: 66  Temp: 97.4 F (36.3 C)  Resp: 18   General appearance: alert and cooperative  GI: soft, non-tender; bowel sounds normal; no masses, no organomegaly  Perianal- minimal fecal leakage and skin irritation   Assessment and Plan:  Brandon Oliver is here for f/u of his rectal bleeding and anal irritation. This appears to be doing better.  I will see him back in 8 weeks.   Vanita Panda, MD  [facility], [place]  [phone]

## 2012-11-18 NOTE — Patient Instructions (Signed)
Return for follow up in 2 months.  Continue cream daily.

## 2012-12-01 ENCOUNTER — Other Ambulatory Visit (HOSPITAL_COMMUNITY): Payer: Self-pay | Admitting: Physician Assistant

## 2012-12-19 ENCOUNTER — Other Ambulatory Visit (HOSPITAL_COMMUNITY): Payer: Self-pay | Admitting: Physician Assistant

## 2012-12-25 ENCOUNTER — Encounter (INDEPENDENT_AMBULATORY_CARE_PROVIDER_SITE_OTHER): Payer: Self-pay | Admitting: General Surgery

## 2013-02-16 ENCOUNTER — Ambulatory Visit (INDEPENDENT_AMBULATORY_CARE_PROVIDER_SITE_OTHER): Payer: Medicare Other | Admitting: General Surgery

## 2013-02-22 ENCOUNTER — Encounter (INDEPENDENT_AMBULATORY_CARE_PROVIDER_SITE_OTHER): Payer: Self-pay | Admitting: General Surgery

## 2013-02-22 ENCOUNTER — Ambulatory Visit (INDEPENDENT_AMBULATORY_CARE_PROVIDER_SITE_OTHER): Payer: Medicare Other | Admitting: General Surgery

## 2013-02-22 VITALS — BP 100/68 | HR 72 | Temp 97.3°F | Resp 16 | Ht 69.0 in | Wt 186.0 lb

## 2013-02-22 DIAGNOSIS — L290 Pruritus ani: Secondary | ICD-10-CM

## 2013-02-22 NOTE — Patient Instructions (Signed)
Stop the antifungal cream.  Wean the steroid cream to every other day for 2 weeks then off.  You may use the steroid cream only as needed after that.  Try to keep your anal area clean and dry.  Call the office if you have any problems.

## 2013-02-22 NOTE — Progress Notes (Signed)
Brandon Oliver is a 67 y.o. male who is here for a follow up visit regarding his perianal pain.  He is using a steroid cream and an antifungal cream rather regularly.  He denies any bleeding or itching.  He is generally free of constipation.  Objective: Filed Vitals:   02/22/13 1451  BP: 100/68  Pulse: 72  Temp: 97.3 F (36.3 C)  Resp: 16    General appearance: alert and cooperative GI: soft, non-tender; bowel sounds normal; no masses,  no organomegaly Perianal: mild irritation, much improved.   Assessment and Plan: Brandon Oliver is a 67 y.o. M who I have been treating for severe perianal pruritis ani.  We will begin to wean the steroids off and stop the antifungal cream.  He will then use the steroid cream only as needed.  I will see him back PRN.       Marland KitchenVanita Panda, MD [facility], [place] [phone]

## 2013-04-05 ENCOUNTER — Ambulatory Visit (INDEPENDENT_AMBULATORY_CARE_PROVIDER_SITE_OTHER): Payer: Medicare Other | Admitting: Ophthalmology

## 2013-05-19 ENCOUNTER — Ambulatory Visit (INDEPENDENT_AMBULATORY_CARE_PROVIDER_SITE_OTHER): Payer: Medicare Other | Admitting: Ophthalmology

## 2013-05-19 DIAGNOSIS — H2510 Age-related nuclear cataract, unspecified eye: Secondary | ICD-10-CM

## 2013-05-19 DIAGNOSIS — H43819 Vitreous degeneration, unspecified eye: Secondary | ICD-10-CM

## 2013-05-19 DIAGNOSIS — Q143 Congenital malformation of choroid: Secondary | ICD-10-CM

## 2013-05-19 DIAGNOSIS — H33309 Unspecified retinal break, unspecified eye: Secondary | ICD-10-CM

## 2013-05-24 ENCOUNTER — Ambulatory Visit (HOSPITAL_COMMUNITY): Payer: Medicare Other | Admitting: Physician Assistant

## 2013-05-26 ENCOUNTER — Encounter (INDEPENDENT_AMBULATORY_CARE_PROVIDER_SITE_OTHER): Payer: Self-pay

## 2013-05-26 ENCOUNTER — Encounter (HOSPITAL_COMMUNITY): Payer: Self-pay | Admitting: Physician Assistant

## 2013-05-26 ENCOUNTER — Ambulatory Visit (INDEPENDENT_AMBULATORY_CARE_PROVIDER_SITE_OTHER): Payer: Medicare Other | Admitting: Physician Assistant

## 2013-05-26 VITALS — BP 122/78 | HR 88 | Ht 66.0 in | Wt 190.0 lb

## 2013-05-26 DIAGNOSIS — F259 Schizoaffective disorder, unspecified: Secondary | ICD-10-CM

## 2013-05-26 MED ORDER — RISPERIDONE 2 MG PO TABS: ORAL_TABLET | ORAL | Status: DC

## 2013-05-26 MED ORDER — SERTRALINE HCL 100 MG PO TABS: ORAL_TABLET | ORAL | Status: DC

## 2013-05-26 NOTE — Progress Notes (Signed)
   [facility] Follow-up Outpatient Visit  Brandon Oliver September 22, 1945  Date: 05/26/2013   Subjective: Brandon Oliver presents today to follow up on his treatment for schizoaffective disorder.  He reports that he has been doing "all right." He reports that his mood has been stable. He denies these been having any auditory or visual hallucinations or psychotic delusions. He reports that his brother in all recently had 3 strokes, and underwent surgery on his carotid arteries and is currently improving. [name] reports that he is glad he went to visit his brother in law and sister in [place] about a month ago. He continues to experience occasional lightheadedness when he will stands too quickly. He also continues to frequently visit members of his church who are in nursing homes.  Filed Vitals:   05/26/13 1337  BP: 122/78  Pulse: 88    Mental Status Examination  Appearance: Casual Alert: Yes Attention: good  Cooperative: Yes Eye Contact: Good Speech: Clear and coherent Psychomotor Activity: Normal Memory/Concentration: Intact Oriented: person, place, time/date and situation Mood: Euthymic Affect: Blunt Thought Processes and Associations: Linear Fund of Knowledge: Good Thought Content: Normal Insight: Fair Judgement: Good  Diagnosis: Schizoaffective disorder  Treatment Plan: We will continue his Risperdal 2 mg at bedtime, and Zoloft 100 mg daily. He will return to see Dr. Lolly Mustache in approximately 3 months.  Mistie Adney, PA-C

## 2013-05-27 ENCOUNTER — Other Ambulatory Visit (HOSPITAL_COMMUNITY): Payer: Self-pay | Admitting: Physician Assistant

## 2013-06-29 ENCOUNTER — Encounter: Payer: Self-pay | Admitting: Interventional Cardiology

## 2013-06-29 ENCOUNTER — Ambulatory Visit (INDEPENDENT_AMBULATORY_CARE_PROVIDER_SITE_OTHER): Payer: Medicare Other | Admitting: Interventional Cardiology

## 2013-06-29 VITALS — BP 125/65 | HR 67 | Ht 69.0 in | Wt 188.0 lb

## 2013-06-29 DIAGNOSIS — I2510 Atherosclerotic heart disease of native coronary artery without angina pectoris: Secondary | ICD-10-CM | POA: Insufficient documentation

## 2013-06-29 DIAGNOSIS — F329 Major depressive disorder, single episode, unspecified: Secondary | ICD-10-CM

## 2013-06-29 DIAGNOSIS — F332 Major depressive disorder, recurrent severe without psychotic features: Secondary | ICD-10-CM

## 2013-06-29 DIAGNOSIS — E785 Hyperlipidemia, unspecified: Secondary | ICD-10-CM

## 2013-06-29 NOTE — Patient Instructions (Signed)
Your physician recommends that you continue on your current medications as directed. Please refer to the Current Medication list given to you today.  Your physician wants you to follow-up in: 1 year You will receive a reminder letter in the mail two months in advance. If you don't receive a letter, please call our office to schedule the follow-up appointment.   Your physician discussed the importance of regular exercise and recommended that you start or continue a regular exercise program for good health.   

## 2013-06-29 NOTE — Progress Notes (Signed)
Patient ID: Brandon Oliver, male   DOB: Mar 23, 1946, 67 y.o.   MRN: [id number]   Date: 06/29/2013 ID: Brandon Oliver, DOB 23-Dec-1945, MRN [id number] PCP:  Duane Lope, MD  Reason: Cardiology followup  ASSESSMENT;  1. Coronary atherosclerotic heart disease, asymptomatic. Circumflex bare-metal stents greater than 60 years old. 2. History of depression, controlled 3. Hyperlipidemia   PLAN:  1. Clinical followup in one year 2. Improve physical activity and daily lifestyle. 3. Continue current medical regimen.   SUBJECTIVE: Brandon Oliver is a 67 y.o. male who is doing well without complaints of cardiovascular symptoms. He had bare-metal stents implanted in the setting of myocardial infarction in 1998. He has had no recurrent cardiac events. A nuclear study performed in 2011 at [facility] cardiology revealed a lateral wall defect but no evidence of ischemia. He denies orthopnea, PND, edema, palpitations, transient neurological symptoms, or medication side effects. Occasionally walks up to a mile and a half at his exercise facility.   Allergies  Allergen Reactions  . Penicillins     Current Outpatient Prescriptions on File Prior to Visit  Medication Sig Dispense Refill  . aspirin 81 MG tablet Take 81 mg by mouth daily.      . carvedilol (COREG) 3.125 MG tablet Take 3.125 mg by mouth 2 (two) times daily with a meal.       . Multiple Vitamin (MULTIVITAMIN) tablet Take 1 tablet by mouth daily.      . risperiDONE (RISPERDAL) 2 MG tablet TAKE 1 TABLET AT BEDTIME  90 tablet  1  . sertraline (ZOLOFT) 100 MG tablet TAKE 1 TABLET AT BEDTIME  90 tablet  1  . simvastatin (ZOCOR) 40 MG tablet Take 40 mg by mouth daily.        No current facility-administered medications on file prior to visit.    Past Medical History  Diagnosis Date  . Hyperlipidemia   . Myocardial infarction     Past Surgical History  Procedure Laterality Date  . Hemorroidectomy      History   Social History  .  Marital Status: Single    Spouse Name: N/A    Number of Children: N/A  . Years of Education: N/A   Occupational History  . Not on file.   Social History Main Topics  . Smoking status: Never Smoker   . Smokeless tobacco: Never Used  . Alcohol Use: No  . Drug Use: No  . Sexual Activity: Not on file   Other Topics Concern  . Not on file   Social History Narrative  . No narrative on file    Family History  Problem Relation Age of Onset  . Cancer Mother     breast  . Cancer Father     colon  . Diabetes Maternal Aunt   . Cancer Paternal Aunt     "male" cancer  . Cancer Paternal Uncle     pancreatic  . Cancer Paternal Grandfather     colon  . Cancer Paternal Uncle     lung  . Cancer Maternal Grandmother     pancreatic    ROS: Denies medication side effects. No blood in urine or stool.. Other systems negative for complaints.  OBJECTIVE: BP 125/65  Pulse 67  Ht 5\' 9"  (1.753 m)  Wt 188 lb (85.276 kg)  BMI 27.75 kg/m2,  General: No acute distress, somewhat unkempt HEENT: normal without conjunctival pallor Neck: JVD flat. Carotids absent Chest: Clear Cardiac: Murmur: None. Gallop: S4. Rhythm: Regular. Other:  Normal Abdomen: Bruit: Absent. Pulsation: Absent Extremities: Edema: None. Pulses: 2+ Neuro: Normal Psych: Stoic  ECG: Normal sinus rhythm with normal appearance  Past Medical History  Hyperlipidemia  depression sees Dr.Watts/[facility].  Hypercholesterolemia  Shoulder pain  family history colon cancer - Father & Grandfather  CAD with prior BMS x 2, 1998  Pruritus Ani [facility] 2/14   Surgical History  PTCA w/ stents x 2   hemorrhoidectomy

## 2013-08-27 ENCOUNTER — Ambulatory Visit (INDEPENDENT_AMBULATORY_CARE_PROVIDER_SITE_OTHER): Payer: Medicare Other | Admitting: Psychiatry

## 2013-08-27 ENCOUNTER — Encounter (INDEPENDENT_AMBULATORY_CARE_PROVIDER_SITE_OTHER): Payer: Self-pay

## 2013-08-27 ENCOUNTER — Encounter (HOSPITAL_COMMUNITY): Payer: Self-pay | Admitting: Psychiatry

## 2013-08-27 VITALS — BP 127/73 | HR 93 | Wt 183.0 lb

## 2013-08-27 DIAGNOSIS — F259 Schizoaffective disorder, unspecified: Secondary | ICD-10-CM

## 2013-08-27 NOTE — Progress Notes (Signed)
[place] [address] Progress Note  DUWARD ALLBRITTON [id number] 68 y.o.  08/27/2013 11:34 AM  Chief Complaint:  I'm doing better on the medication.  History of Present Illness:  Patient is a 68 year old Caucasian man who has been seen in this office since April 2005 under the diagnoses of schizoaffective disorder came for his followup appointment.  I have seen him in 2005 and then he was seeing physician assistant who recently left the practice.  Patient is taking Risperdal and Zoloft.  He has no issues and he is compliant with the medication.  He reported good response to the medication.  He denies any irritability, anger, mood swings, paranoia or any hallucination.  He tried to keep himself very busy in church activities.  He is a good friend who is very supportive.  Patient does not have any children.  Recently he was concerned about his brother who has a stroke but now feels better.  Patient is sleeping good.  His appetite and weight is stable.  Patient denies any tremors, shakes or any extrapyramidal side effects.  He was to continue his current medication.  His primary care physician is Dr. Harrington Challenger at [facility].  He scheduled to have blood work this spring.  Patient is not drinking or using any illegal substances.  Suicidal Ideation: No Plan Formed: No Patient has means to carry out plan: No  Homicidal Ideation: No Plan Formed: No Patient has means to carry out plan: No  Medical History; Patient has hypertension.  Psychosocial History; Patient lives by himself.  His mother lived in [place].  His sister also moved from [place] to [place].  He has no children.  History Of Abuse; Patient denies any history of physical, sexual or any verbal abuse.  Substance Abuse History; Patient denies any history of drinking or any illegal substance use.  Past Psychiatric History/Hospitalization(s) Patient has one psychiatric hospitalization in 2005 when he was admitted to  behavioral [place] because of psychosis and paranoia. Anxiety: No Bipolar Disorder: No Depression: No Mania: No Psychosis: Yes Schizophrenia: No Personality Disorder: No Hospitalization for psychiatric illness: Yes History of Electroconvulsive Shock Therapy: No Prior Suicide Attempts: No   Review of Systems: Psychiatric: Agitation: No Hallucination: No Depressed Mood: No Insomnia: No Hypersomnia: No Altered Concentration: No Feels Worthless: No Grandiose Ideas: No Belief In Special Powers: No New/Increased Substance Abuse: No Compulsions: No  Neurologic: Headache: No Seizure: No Paresthesias: No    Outpatient Encounter Prescriptions as of 08/27/2013  Medication Sig  . aspirin 81 MG tablet Take 81 mg by mouth daily.  . carvedilol (COREG) 3.125 MG tablet Take 3.125 mg by mouth 2 (two) times daily with a meal.   . Multiple Vitamin (MULTIVITAMIN) tablet Take 1 tablet by mouth daily.  . risperiDONE (RISPERDAL) 2 MG tablet TAKE 1 TABLET AT BEDTIME  . sertraline (ZOLOFT) 100 MG tablet TAKE 1 TABLET AT BEDTIME  . simvastatin (ZOCOR) 40 MG tablet Take 40 mg by mouth daily.     No results found for this or any previous visit (from the past 2160 hour(s)).    Physical Exam: Constitutional:  BP 127/73  Pulse 93  Wt 183 lb (83.008 kg)  Musculoskeletal: Strength & Muscle Tone: within normal limits Gait & Station: normal Patient leans: Patient has a normal posture  Mental Status Examination;  Patient is casually dressed and groomed.  He appears to be in his stated age.  He is anxious but cooperative.  He maintained fair eye contact.  His speech is slow but coherent.  His thought processes slow but logical and goal-directed.  He has no flight of ideas or any loose association.  He denies any active or passive suicidal thoughts or homicidal thoughts.  There were no paranoia, delusions or any obsessions present at this time.  Psychomotor activity is slightly decreased.   He has no tremors or shakes.  His fund of knowledge is adequate.  His memory intact.  His attention and concentration is fair.  He described his mood is anxious and his affect is mood appropriate.  He is alert and oriented x3.  His insight judgment and impulse control is okay.   Review of Psycho-Social Stressors (1), Review or order clinical lab tests (1), Decision to obtain old records (1), Review and summation of old records (2), Review of Last Therapy Session (1) and Review of Medication Regimen & Side Effects (2)  Assessment: Axis I: Schizoaffective disorder  Axis II: Deferred  Axis III:  Past Medical History  Diagnosis Date  . Hyperlipidemia   . Myocardial infarction     Axis IV: Mild   Plan:  I review his symptoms, collateral information, history and his current medication.  Patient is stable on this medication.  I will get records from his primary care physician including his blurred results.  Patient does not have any side effects of the medication.  He has no tremors, shakes or any EPS.  I would recommend to continue Risperdal 2 mg at bedtime and Zoloft 100 mg daily.  Recommend to call us back if he has any questions or any concern.  I will see him again in 3 months.Time spent 25 minutes.  More than 50% of the time spent in psychoeducation, counseling and coordination of care.  Discuss safety plan that anytime having active suicidal thoughts or homicidal thoughts then patient need to call 911 or go to the local emergency room.    Juel Ripley T., MD 08/27/2013

## 2013-11-19 ENCOUNTER — Other Ambulatory Visit (HOSPITAL_COMMUNITY): Payer: Self-pay | Admitting: Physician Assistant

## 2013-11-19 DIAGNOSIS — F259 Schizoaffective disorder, unspecified: Secondary | ICD-10-CM

## 2013-11-25 ENCOUNTER — Encounter (HOSPITAL_COMMUNITY): Payer: Self-pay | Admitting: Psychiatry

## 2013-11-25 ENCOUNTER — Encounter (INDEPENDENT_AMBULATORY_CARE_PROVIDER_SITE_OTHER): Payer: Self-pay

## 2013-11-25 ENCOUNTER — Ambulatory Visit (INDEPENDENT_AMBULATORY_CARE_PROVIDER_SITE_OTHER): Payer: Medicare Other | Admitting: Psychiatry

## 2013-11-25 VITALS — BP 129/74 | HR 81 | Ht 66.0 in | Wt 189.6 lb

## 2013-11-25 DIAGNOSIS — F259 Schizoaffective disorder, unspecified: Secondary | ICD-10-CM

## 2013-11-25 NOTE — Progress Notes (Signed)
[name] Progress Note  Brandon Oliver [id number] 68 y.o.  11/25/2013 1:45 PM  Chief Complaint:  Medication management and followup.    History of Present Illness:  Brandon Oliver came for his followup appointment.  He is complying with his Risperdal and Zoloft.  He denies any side effects.  He is sleeping good.  He is excited about his upcoming birthday .  Today his sister has birthday.  Patient overall doing better.  He denies any irritability, anger, mood swing.  He denies any paranoia on his mission.  He admitted increased weight gain because he is not watching his calorie intake but he decided to lose this weight and not he is watching his calorie intake every day.  Patient is scheduled to see his primary care physician next month.  Patient see Dr. Harrington Challenger at [facility] physician.  Patient denies any agitation or any paranoia.  He was to continue his current psychotropic medication.  Patient is not drinking alcohol or using any illegal substances.  Patient lives by himself.  He is retired since May 2012.  He has no children.  Past Psychiatric History/Hospitalization(s) Patient has one psychiatric hospitalization in 2005 when he was admitted to behavioral [place] because of psychosis and paranoia. Anxiety: No Bipolar Disorder: No Depression: No Mania: No Psychosis: Yes Schizophrenia: No Personality Disorder: No Hospitalization for psychiatric illness: Yes History of Electroconvulsive Shock Therapy: No Prior Suicide Attempts: No   Review of Systems: Psychiatric: Agitation: No Hallucination: No Depressed Mood: No Insomnia: No Hypersomnia: No Altered Concentration: No Feels Worthless: No Grandiose Ideas: No Belief In Special Powers: No New/Increased Substance Abuse: No Compulsions: No  Neurologic: Headache: No Seizure: No Paresthesias: No    Outpatient Encounter Prescriptions as of 11/25/2013  Medication Sig  . aspirin 81 MG tablet Take 81 mg by mouth  daily.  . carvedilol (COREG) 3.125 MG tablet Take 3.125 mg by mouth 2 (two) times daily with a meal.   . Multiple Vitamin (MULTIVITAMIN) tablet Take 1 tablet by mouth daily.  . risperiDONE (RISPERDAL) 2 MG tablet TAKE 1 TABLET BY MOUTH AT BEDTIME  . sertraline (ZOLOFT) 100 MG tablet TAKE 1 TABLET BY MOUTH AT BEDTIME  . simvastatin (ZOCOR) 40 MG tablet Take 40 mg by mouth daily.     No results found for this or any previous visit (from the past 2160 hour(s)).    Physical Exam: Constitutional:  BP 129/74  Pulse 81  Ht 5\' 6"  (1.676 m)  Wt 189 lb 9.6 oz (86.002 kg)  BMI 30.62 kg/m2  Musculoskeletal: Strength & Muscle Tone: within normal limits Gait & Station: normal Patient leans: Patient has a normal posture  Mental Status Examination;  Patient is casually dressed and groomed.  He appears to be in his stated age.  He is anxious but cooperative.  He maintained fair eye contact.  His speech is slow but coherent.  His thought processes slow but logical and goal-directed.  He has no flight of ideas or any loose association.  He denies any active or passive suicidal thoughts or homicidal thoughts.  There were no paranoia, delusions or any obsessions present at this time.  Psychomotor activity is slightly decreased.  He has no tremors or shakes.  His fund of knowledge is adequate.  His memory intact.  His attention and concentration is fair.  He described his mood is anxious and his affect is mood appropriate.  He is alert and oriented x3.  His insight judgment and impulse control is  okay.   Review of Psycho-Social Stressors (1), Review of Last Therapy Session (1) and Review of Medication Regimen & Side Effects (2)  Assessment: Axis I: Schizoaffective disorder  Axis II: Deferred  Axis III:  Past Medical History  Diagnosis Date  . Hyperlipidemia   . Myocardial infarction     Axis IV: Mild   Plan:  Patient is doing better on his current psychotropic medication.  He scheduled to  see his primary care physician next month.  I recommended to have his blood work and records faxed to [place].  I will continue continue Risperdal 2 mg at bedtime and Zoloft 100 mg daily.  Recommend to call us back if he has any questions or any concern.  I will see him again in 3 months.  Trenae Brunke T., MD 11/25/2013

## 2014-02-17 ENCOUNTER — Other Ambulatory Visit (HOSPITAL_COMMUNITY): Payer: Self-pay | Admitting: Psychiatry

## 2014-02-17 DIAGNOSIS — F259 Schizoaffective disorder, unspecified: Secondary | ICD-10-CM

## 2014-02-24 ENCOUNTER — Encounter (HOSPITAL_COMMUNITY): Payer: Self-pay | Admitting: Psychiatry

## 2014-02-24 ENCOUNTER — Ambulatory Visit (INDEPENDENT_AMBULATORY_CARE_PROVIDER_SITE_OTHER): Payer: Medicare Other | Admitting: Psychiatry

## 2014-02-24 VITALS — BP 119/67 | HR 80 | Ht 69.0 in | Wt 194.4 lb

## 2014-02-24 DIAGNOSIS — F259 Schizoaffective disorder, unspecified: Secondary | ICD-10-CM

## 2014-02-24 DIAGNOSIS — F250 Schizoaffective disorder, bipolar type: Secondary | ICD-10-CM

## 2014-02-24 NOTE — Progress Notes (Signed)
[facility] [phone] Progress Note  Brandon Oliver [id number] 68 y.o.  02/24/2014 1:29 PM  Chief Complaint:  Medication management and followup.    History of Present Illness:  Brandon Oliver came for his followup appointment.  He is taking his medication as prescribed.  He said he seen his primary care physician Dr. Harrington Challenger at [facility] and there has been no changes.  He denies any irritability, anger, paranoia or any hallucination.  He is sleeping good.  He has gained weight from the past and he admitted not keeping his life control.  He spending more time with his friend and sometimes he eats more than usual.  He denies any side effects including any tremors or shakes.  His energy level is good.  He is retired since 2012 and he is trying to keep himself busy by visiting friends at the assisted living facility and involve himself in church activities.  He lives by himself.  He has no children.  He was to continue his current medication because it is helping him.  He does not drink or use any illegal substances.  Past Psychiatric History/Hospitalization(s) Patient has one psychiatric hospitalization in 2005 when he was admitted to behavioral [facility] because of psychosis and paranoia. Anxiety: No Bipolar Disorder: No Depression: No Mania: No Psychosis: Yes Schizophrenia: No Personality Disorder: No Hospitalization for psychiatric illness: Yes History of Electroconvulsive Shock Therapy: No Prior Suicide Attempts: No   Review of Systems: Psychiatric: Agitation: No Hallucination: No Depressed Mood: No Insomnia: No Hypersomnia: No Altered Concentration: No Feels Worthless: No Grandiose Ideas: No Belief In Special Powers: No New/Increased Substance Abuse: No Compulsions: No  Neurologic: Headache: No Seizure: No Paresthesias: No    Outpatient Encounter Prescriptions as of 02/24/2014  Medication Sig  . aspirin 81 MG tablet Take 81 mg by mouth daily.  .  carvedilol (COREG) 3.125 MG tablet Take 3.125 mg by mouth 2 (two) times daily with a meal.   . Multiple Vitamin (MULTIVITAMIN) tablet Take 1 tablet by mouth daily.  . risperiDONE (RISPERDAL) 2 MG tablet TAKE 1 TABLET BY MOUTH AT BEDTIME  . sertraline (ZOLOFT) 100 MG tablet TAKE 1 TABLET BY MOUTH AT BEDTIME  . simvastatin (ZOCOR) 40 MG tablet Take 40 mg by mouth daily.     No results found for this or any previous visit (from the past 2160 hour(s)).    Physical Exam: Constitutional:  BP 119/67  Pulse 80  Ht 5\' 9"  (1.753 m)  Wt 194 lb 6.4 oz (88.179 kg)  BMI 28.69 kg/m2  Musculoskeletal: Strength & Muscle Tone: within normal limits Gait & Station: normal Patient leans: Patient has a normal posture  Mental Status Examination;  Patient is casually dressed and groomed.  He appears to be in his stated age.  He is pleasant and cooperative.  He maintained fair eye contact.  His speech is slow but coherent.  His thought processes slow but logical and goal-directed.  He has no flight of ideas or any loose association.  He denies any active or passive suicidal thoughts or homicidal thoughts.  There were no paranoia, delusions or any obsessions present at this time.  Psychomotor activity is slightly decreased.  He has no tremors or shakes.  His fund of knowledge is adequate.  His memory intact.  His attention and concentration is fair.  He described his mood is anxious and his affect is mood appropriate.  He is alert and oriented x3.  His insight judgment and impulse control is  okay.   Review of Psycho-Social Stressors (1), Review of Last Therapy Session (1) and Review of Medication Regimen & Side Effects (2)  Assessment: Axis I: Schizoaffective disorder  Axis II: Deferred  Axis III:  Past Medical History  Diagnosis Date  . Hyperlipidemia   . Myocardial infarction     Axis IV: Mild   Plan:  Patient is doing better on his current psychotropic medication.  I will continue his  Risperdal 2 mg at bedtime and Zoloft 100 mg daily.  Recommend to call us back if he has any questions or any concern.  I will see him again in 3 months.  Abdulrahim Siddiqi T., MD 02/24/2014

## 2014-05-19 ENCOUNTER — Other Ambulatory Visit (HOSPITAL_COMMUNITY): Payer: Self-pay | Admitting: Psychiatry

## 2014-05-30 ENCOUNTER — Ambulatory Visit (HOSPITAL_COMMUNITY): Payer: Self-pay | Admitting: Psychiatry

## 2014-05-31 ENCOUNTER — Encounter (HOSPITAL_COMMUNITY): Payer: Self-pay | Admitting: Psychiatry

## 2014-05-31 ENCOUNTER — Ambulatory Visit (INDEPENDENT_AMBULATORY_CARE_PROVIDER_SITE_OTHER): Payer: Medicare Other | Admitting: Psychiatry

## 2014-05-31 VITALS — BP 137/75 | HR 72 | Ht 69.0 in | Wt 201.0 lb

## 2014-05-31 DIAGNOSIS — F259 Schizoaffective disorder, unspecified: Secondary | ICD-10-CM

## 2014-05-31 DIAGNOSIS — F250 Schizoaffective disorder, bipolar type: Secondary | ICD-10-CM

## 2014-05-31 MED ORDER — RISPERIDONE 2 MG PO TABS: 2.0000 mg | ORAL_TABLET | Freq: Every day | ORAL | Status: DC

## 2014-05-31 MED ORDER — SERTRALINE HCL 100 MG PO TABS: 100.0000 mg | ORAL_TABLET | Freq: Every day | ORAL | Status: DC

## 2014-05-31 NOTE — Progress Notes (Signed)
[place] Progress Note  Brandon Oliver [id number] 68 y.o.  05/31/2014 3:57 PM  Chief Complaint:  Medication management and followup.    History of Present Illness:  Brandon Oliver came for his followup appointment. He is complying with his medication and denies any side effects.  He is concerned about his weight gain which she believed because of not able to do exercise and watching his calorie intake.  He admitted eating more sugar and taking late night snacks.  Overall his mood has been stable.  He denies any irritability, anger, mood swings.  He sleeping good.  He denies any paranoia or any hallucination.  He was to continue his current psychotropic medication.  He is trying to keep himself busy by visiting friends and doing church related activities.  He is not drinking or using any illegal substances.  He lives by himself.  He has no children.  Past Psychiatric History/Hospitalization(s) Patient has one psychiatric hospitalization in 2005 when he was admitted to behavioral [place] because of psychosis and paranoia. Anxiety: No Bipolar Disorder: No Depression: No Mania: No Psychosis: Yes Schizophrenia: No Personality Disorder: No Hospitalization for psychiatric illness: Yes History of Electroconvulsive Shock Therapy: No Prior Suicide Attempts: No  Medical History; Patient has hyperlipidemia and history of myocardial infarction.  His primary care physician is Dr. Harrington Challenger at [facility].  Review of Systems: Psychiatric: Agitation: No Hallucination: No Depressed Mood: No Insomnia: No Hypersomnia: No Altered Concentration: No Feels Worthless: No Grandiose Ideas: No Belief In Special Powers: No New/Increased Substance Abuse: No Compulsions: No  Neurologic: Headache: No Seizure: No Paresthesias: No    Outpatient Encounter Prescriptions as of 05/31/2014  Medication Sig  . aspirin 81 MG tablet Take 81 mg by mouth daily.  . carvedilol (COREG)  3.125 MG tablet Take 3.125 mg by mouth 2 (two) times daily with a meal.   . Multiple Vitamin (MULTIVITAMIN) tablet Take 1 tablet by mouth daily.  . risperiDONE (RISPERDAL) 2 MG tablet Take 1 tablet (2 mg total) by mouth at bedtime.  . sertraline (ZOLOFT) 100 MG tablet Take 1 tablet (100 mg total) by mouth daily.  . simvastatin (ZOCOR) 40 MG tablet Take 40 mg by mouth daily.   . [DISCONTINUED] risperiDONE (RISPERDAL) 2 MG tablet TAKE 1 TABLET BY MOUTH AT BEDTIME  . [DISCONTINUED] sertraline (ZOLOFT) 100 MG tablet TAKE 1 TABLET BY MOUTH AT BEDTIME    No results found for this or any previous visit (from the past 2160 hour(s)).    Physical Exam: Constitutional:  BP 137/75 mmHg  Pulse 72  Ht 5\' 9"  (1.753 m)  Wt 201 lb (91.173 kg)  BMI 29.67 kg/m2  Musculoskeletal: Strength & Muscle Tone: within normal limits Gait & Station: normal Patient leans: Patient has a normal posture  Mental Status Examination;  Patient is casually dressed and groomed.  He is pleasant and cooperative.  He maintained fair eye contact.  His speech is slow but coherent.  His thought processes slow but logical and goal-directed.  He has no flight of ideas or any loose association.  He denies any active or passive suicidal thoughts or homicidal thoughts.  There were no paranoia, delusions or any obsessions present at this time.  Psychomotor activity is slightly decreased.  He has no tremors or shakes.  His fund of knowledge is adequate.  His memory intact.  His attention and concentration is fair.  He described his mood is anxious and his affect is mood appropriate.  He is alert  and oriented x3.  His insight judgment and impulse control is okay.   Review of Psycho-Social Stressors (1), Review of Last Therapy Session (1) and Review of Medication Regimen & Side Effects (2)  Assessment: Axis I: Schizoaffective disorder  Axis II: Deferred  Axis III:  Past Medical History  Diagnosis Date  . Hyperlipidemia   .  Myocardial infarction     Axis IV: Mild   Plan:  Patient is doing better on his current psychotropic medication. I encouraged to keep a close eye on his calorie intake and recommended do regular exercise.  I will continue his Risperdal 2 mg at bedtime and Zoloft 100 mg daily.  Recommend to call us back if he has any questions or any concern.  I will see him again in 3 months.  Tyquarius Paglia T., MD 05/31/2014

## 2014-06-03 ENCOUNTER — Ambulatory Visit (INDEPENDENT_AMBULATORY_CARE_PROVIDER_SITE_OTHER): Payer: Medicare Other | Admitting: Ophthalmology

## 2014-06-03 DIAGNOSIS — H33301 Unspecified retinal break, right eye: Secondary | ICD-10-CM

## 2014-06-03 DIAGNOSIS — D3132 Benign neoplasm of left choroid: Secondary | ICD-10-CM

## 2014-06-03 DIAGNOSIS — H43813 Vitreous degeneration, bilateral: Secondary | ICD-10-CM

## 2014-06-29 ENCOUNTER — Ambulatory Visit: Payer: Self-pay | Admitting: Interventional Cardiology

## 2014-08-09 ENCOUNTER — Encounter: Payer: Self-pay | Admitting: Interventional Cardiology

## 2014-08-09 ENCOUNTER — Ambulatory Visit (INDEPENDENT_AMBULATORY_CARE_PROVIDER_SITE_OTHER): Payer: Medicare Other | Admitting: Interventional Cardiology

## 2014-08-09 VITALS — BP 152/64 | HR 90 | Ht 69.0 in | Wt 196.0 lb

## 2014-08-09 DIAGNOSIS — F259 Schizoaffective disorder, unspecified: Secondary | ICD-10-CM

## 2014-08-09 DIAGNOSIS — I25709 Atherosclerosis of coronary artery bypass graft(s), unspecified, with unspecified angina pectoris: Secondary | ICD-10-CM

## 2014-08-09 DIAGNOSIS — E785 Hyperlipidemia, unspecified: Secondary | ICD-10-CM

## 2014-08-09 NOTE — Progress Notes (Signed)
Patient ID: Brandon Oliver, male   DOB: 1946-02-08, 69 y.o.   MRN: [id number]    [address], [address] [place], [place]  [address] Phone: [phone] Fax:  [phone]  Date:  08/09/2014   ID:  Brandon Oliver, DOB August 21, 1945, MRN [id number]  PCP:   [name], MD   ASSESSMENT:  1. Coronary artery disease the circumflex bare-metal stent 1998 2. Hyperlipidemia 3. Essential hypertension  PLAN:  1.  Exercise treadmill test as a surveillance to rule out restenosis or progression of coronary disease 2-3 months from this point  2. Clinical follow-up in one year  3. No change in current medical regimen   SUBJECTIVE: Brandon Oliver is a 69 y.o. male  Who twisted his ankle yesterday at church. Otherwise he has no complaints. He is concerned that he may be having difficulty with his stent. He has no chest discomfort or dyspnea. He denies palpitations and orthopnea. Is no peripheral edema.   Wt Readings from Last 3 Encounters:  08/09/14 196 lb (88.905 kg)  05/31/14 201 lb (91.173 kg)  02/24/14 194 lb 6.4 oz (88.179 kg)     Past Medical History  Diagnosis Date  . Hyperlipidemia   . Myocardial infarction     Current Outpatient Prescriptions  Medication Sig Dispense Refill  . aspirin 81 MG tablet Take 81 mg by mouth daily.    . carvedilol (COREG) 3.125 MG tablet Take 3.125 mg by mouth 2 (two) times daily with a meal.     . Multiple Vitamin (MULTIVITAMIN) tablet Take 1 tablet by mouth daily.    . Omega-3 Fatty Acids (FISH OIL) 300 MG CAPS Take by mouth 2 (two) times daily.    . risperiDONE (RISPERDAL) 2 MG tablet Take 1 tablet (2 mg total) by mouth at bedtime. 90 tablet 0  . sertraline (ZOLOFT) 100 MG tablet Take 1 tablet (100 mg total) by mouth daily. 90 tablet 0  . simvastatin (ZOCOR) 40 MG tablet Take 40 mg by mouth daily.     Marland Kitchen ZOSTAVAX 75643 UNT/0.65ML injection Inject 0.65 mLs into the skin once.      No current facility-administered medications for this visit.     Allergies:    Allergies  Allergen Reactions  . Penicillins     Social History:  The patient  reports that he has never smoked. He has never used smokeless tobacco. He reports that he does not drink alcohol or use illicit drugs.   ROS:  Please see the history of present illness.    He denies medication side effects. No transient neurological complaints.   All other systems reviewed and negative.   OBJECTIVE: VS:  BP 152/64 mmHg  Pulse 90  Ht 5\' 9"  (1.753 m)  Wt 196 lb (88.905 kg)  BMI 28.93 kg/m2  Blood pressure 122/78 Well nourished, well developed, in no acute distress,  bradykinesia HEENT: normal Neck: JVD  flat. Carotid bruit  absent  Cardiac:  normal S1, S2; RRR; no murmur Lungs:  clear to auscultation bilaterally, no wheezing, rhonchi or rales Abd: soft, nontender, no hepatomegaly Ext: Edema  absent. Pulses  2+ Skin: warm and dry Neuro:  CNs 2-12 intact, no focal abnormalities noted  EKG:   Normal sinus rhythm , small inferior Q waves.       Signed, Illene Labrador III, MD 08/09/2014 1:38 PM

## 2014-08-09 NOTE — Patient Instructions (Signed)
Your physician recommends that you continue on your current medications as directed. Please refer to the Current Medication list given to you today. Your physician has requested that you have an exercise tolerance test. For further information please visit [email or web address]. Please also follow instruction sheet, as given.  PLEASE SCHEDULE THIS TEST FOR IN 2-3 MONTHS FROM NOW.  Your physician wants you to follow-up in: 1 year with Dr. Tamala Julian.  You will receive a reminder letter in the mail two months in advance. If you don't receive a letter, please call our office to schedule the follow-up appointment.

## 2014-08-31 ENCOUNTER — Ambulatory Visit (HOSPITAL_COMMUNITY): Payer: Self-pay | Admitting: Psychiatry

## 2014-09-01 ENCOUNTER — Ambulatory Visit (INDEPENDENT_AMBULATORY_CARE_PROVIDER_SITE_OTHER): Payer: 59 | Admitting: Psychiatry

## 2014-09-01 ENCOUNTER — Encounter (HOSPITAL_COMMUNITY): Payer: Self-pay | Admitting: Psychiatry

## 2014-09-01 VITALS — BP 129/73 | HR 77 | Ht 69.0 in | Wt 202.0 lb

## 2014-09-01 DIAGNOSIS — F259 Schizoaffective disorder, unspecified: Secondary | ICD-10-CM

## 2014-09-01 DIAGNOSIS — F250 Schizoaffective disorder, bipolar type: Secondary | ICD-10-CM

## 2014-09-01 MED ORDER — RISPERIDONE 2 MG PO TABS: 2.0000 mg | ORAL_TABLET | Freq: Every day | ORAL | Status: DC

## 2014-09-01 MED ORDER — SERTRALINE HCL 100 MG PO TABS: 100.0000 mg | ORAL_TABLET | Freq: Every day | ORAL | Status: DC

## 2014-09-01 NOTE — Progress Notes (Signed)
[facility] [phone] Progress Note  Brandon Oliver [id number] 69 y.o.  09/01/2014 3:41 PM  Chief Complaint:  Medication management and followup.    History of Present Illness:  Brandon Oliver came for his followup appointment.  He has a nice Christmas.  He visited [place] to to see his sister who moved from [place].  Patient had a good time.  Overall his mood has been stable.  He is concerned about his weight gain and he admitted not watching his calorie intake but he do exercise 2 times a week.  He had a blood work and physical last November by Dr. Harrington Challenger and he mentioned blood work was normal.  He remember cholesterol 180 and blood sugar less than 90.  Patient is taking Risperdal and Zoloft.  He denies any paranoia, hallucination, irritability or any anger.  He sleeping good.  He denies any feeling of hopelessness or worthlessness.  He is involved in church and has been social.  His energy level is good.  He has no tremors or shakes.  Patient denies drinking or using any illegal substances.  Patient has no children and he lives by himself.  Past Psychiatric History/Hospitalization(s) Patient has one psychiatric hospitalization in 2005 when he was admitted to behavioral [facility] because of psychosis and paranoia. Anxiety: No Bipolar Disorder: No Depression: No Mania: No Psychosis: Yes Schizophrenia: No Personality Disorder: No Hospitalization for psychiatric illness: Yes History of Electroconvulsive Shock Therapy: No Prior Suicide Attempts: No  Medical History; Patient has hyperlipidemia and history of myocardial infarction.  His primary care physician is Dr. Harrington Challenger at [facility].  Review of Systems: Psychiatric: Agitation: No Hallucination: No Depressed Mood: No Insomnia: No Hypersomnia: No Altered Concentration: No Feels Worthless: No Grandiose Ideas: No Belief In Special Powers: No New/Increased Substance Abuse: No Compulsions:  No  Neurologic: Headache: No Seizure: No Paresthesias: No    Outpatient Encounter Prescriptions as of 09/01/2014  Medication Sig  . aspirin 81 MG tablet Take 81 mg by mouth daily.  . carvedilol (COREG) 3.125 MG tablet Take 3.125 mg by mouth 2 (two) times daily with a meal.   . Multiple Vitamin (MULTIVITAMIN) tablet Take 1 tablet by mouth daily.  . Omega-3 Fatty Acids (FISH OIL) 300 MG CAPS Take by mouth 2 (two) times daily.  . risperiDONE (RISPERDAL) 2 MG tablet Take 1 tablet (2 mg total) by mouth at bedtime.  . sertraline (ZOLOFT) 100 MG tablet Take 1 tablet (100 mg total) by mouth daily.  . simvastatin (ZOCOR) 40 MG tablet Take 40 mg by mouth daily.   Marland Kitchen ZOSTAVAX 07622 UNT/0.65ML injection Inject 0.65 mLs into the skin once.   . [DISCONTINUED] risperiDONE (RISPERDAL) 2 MG tablet Take 1 tablet (2 mg total) by mouth at bedtime.  . [DISCONTINUED] sertraline (ZOLOFT) 100 MG tablet Take 1 tablet (100 mg total) by mouth daily.    No results found for this or any previous visit (from the past 2160 hour(s)).    Physical Exam: Constitutional:  BP 129/73 mmHg  Pulse 77  Ht 5\' 9"  (1.753 m)  Wt 202 lb (91.627 kg)  BMI 29.82 kg/m2  Musculoskeletal: Strength & Muscle Tone: within normal limits Gait & Station: normal Patient leans: Patient has a normal posture  Mental Status Examination;  Patient is casually dressed and groomed.  He is pleasant and cooperative.  He maintained fair eye contact.  His speech is slow but coherent.  His thought processes slow but logical and goal-directed.  He has no flight  of ideas or any loose association.  He denies any active or passive suicidal thoughts or homicidal thoughts.  There were no paranoia, delusions or any obsessions present at this time.  Psychomotor activity is slightly decreased.  He has no tremors or shakes.  His fund of knowledge is adequate.  His memory intact.  His attention and concentration is fair.  He described his mood is anxious and  his affect is mood appropriate.  He is alert and oriented x3.  His insight judgment and impulse control is okay.   Review of Psycho-Social Stressors (1), Decision to obtain old records (1), Review of Last Therapy Session (1) and Review of Medication Regimen & Side Effects (2)  Assessment: Axis I: Schizoaffective disorder  Axis II: Deferred  Axis III:  Past Medical History  Diagnosis Date  . Hyperlipidemia   . Myocardial infarction     Plan:  Patient is doing better on his current psychotropic medication.  Discussed weight loss program and encourage to do more often exercise and walking.  I will also get records from his primary care physician since he has blood work in November.  Discussed medication side effects and benefits.  Continue Risperdal 2 mg at bedtime and Zoloft 100 mg daily.  Recommend to call us back if he has any questions or any concern.  I will see him again in 3 months.  Varetta Brandon Oliver T., MD 09/01/2014

## 2014-10-25 ENCOUNTER — Encounter: Payer: Self-pay | Admitting: Nurse Practitioner

## 2014-10-25 ENCOUNTER — Ambulatory Visit (INDEPENDENT_AMBULATORY_CARE_PROVIDER_SITE_OTHER): Payer: Medicare Other | Admitting: Nurse Practitioner

## 2014-10-25 DIAGNOSIS — I25709 Atherosclerosis of coronary artery bypass graft(s), unspecified, with unspecified angina pectoris: Secondary | ICD-10-CM | POA: Diagnosis not present

## 2014-10-25 NOTE — Progress Notes (Signed)
Exercise Treadmill Test  Pre-Exercise Testing Evaluation Rhythm: normal sinus  Rate: 64 bpm     Test  Exercise Tolerance Test Ordering MD: Daneen Schick, MD  Interpreting MD: Truitt Merle, NP  Unique Test No: 1  Treadmill:  1  Indication for ETT: known ASHD  Contraindication to ETT: No   Stress Modality: exercise - treadmill  Cardiac Imaging Performed: non   Protocol: standard Bruce - maximal  Max BP:  175/75  Max MPHR (bpm):  152 85% MPR (bpm):  129  MPHR obtained (bpm):  129 % MPHR obtained:  85%  Reached 85% MPHR (min:sec):  5:45 Total Exercise Time (min-sec):  6 minutes  Workload in METS:  6.2 Borg Scale: 15  Reason ETT Terminated:  patient's desire to stop    ST Segment Analysis At Rest: normal ST segments - no evidence of significant ST depression With Exercise: no evidence of significant ST depression  Other Information Arrhythmia:  No Angina during ETT:  absent (0) Quality of ETT:  diagnostic  ETT Interpretation:  normal - no evidence of ischemia by ST analysis  Comments: Patient presents today for routine GXT. Has had remote BMS. Clinically doing well with no complaints - he did NOT hold his Coreg today.  Today the patient exercised on the modified Bruce protocol for a total of 6 minutes.  Reduced exercise tolerance. Adequate blood pressure response.  Clinically negative for chest pain. Test was stopped due to fatigue/achievement of target heart rate.  EKG negative for ischemia. No significant arrhythmia noted - did have an occasional PVC noted.   Recommendations: Continue with his current regimen.  See Dr. Tamala Julian back as planned.  Patient is agreeable to this plan and will call if any problems develop in the interim.   Burtis Junes, RN, [facility] [address] [place], [place]  [address] [phone]

## 2014-10-25 NOTE — Progress Notes (Signed)
let the patient noted he did well on the exercise treadmill test.

## 2014-10-27 ENCOUNTER — Telehealth: Payer: Self-pay

## 2014-10-27 NOTE — Telephone Encounter (Signed)
-----   Message from Belva Crome, MD sent at 10/25/2014  3:43 PM EDT -----   ----- Message -----    From: Burtis Junes, NP    Sent: 10/25/2014  12:27 PM      To: Belva Crome, MD

## 2014-10-27 NOTE — Telephone Encounter (Signed)
called to give pt gxt results.lmtcb

## 2014-10-28 NOTE — Telephone Encounter (Signed)
Pt aware of gxt results.  let the patient noted he did well on the exercise treadmill test Pt verbalized undersranding.Brandon Oliver

## 2014-10-28 NOTE — Progress Notes (Signed)
Pt aware of ETT results with verbal understanding

## 2014-11-03 ENCOUNTER — Other Ambulatory Visit: Payer: Self-pay | Admitting: Gastroenterology

## 2014-11-07 ENCOUNTER — Other Ambulatory Visit (HOSPITAL_COMMUNITY): Payer: Self-pay | Admitting: Psychiatry

## 2014-11-07 NOTE — Telephone Encounter (Signed)
Medication refills received for patient's Zoloft and Risperdal.  Both were prescribed on 09/01/14 with a 90 day supply.  Patient returns for evaluation on 11/30/14 so orders requested too early.

## 2014-11-15 ENCOUNTER — Other Ambulatory Visit (HOSPITAL_COMMUNITY): Payer: Self-pay | Admitting: Psychiatry

## 2014-11-15 NOTE — Telephone Encounter (Signed)
Patient had Zoloft and Risperdal RX sent in 09-01-14---90 day RX with zero refills.  Patient due to follow up 11-30-14.  Called patient to check if he has enough medication until next appointment.  Patient stated he will be fine and if he runs out before appointment then he will call us.

## 2014-11-17 ENCOUNTER — Other Ambulatory Visit (HOSPITAL_COMMUNITY): Payer: Self-pay

## 2014-11-17 DIAGNOSIS — F250 Schizoaffective disorder, bipolar type: Secondary | ICD-10-CM

## 2014-11-17 MED ORDER — SERTRALINE HCL 100 MG PO TABS: 100.0000 mg | ORAL_TABLET | Freq: Every day | ORAL | Status: DC

## 2014-11-17 NOTE — Telephone Encounter (Signed)
Medication management - Follow up call with patient after he requested a call back today.  Patient reported he does not have enough Sertraline to last until appointment 11/30/14.  States will be out out 11/27/14 and last received a 90 day supply 09/01/14.  Patient requests a refill prior to appointment on 11/30/14 so he does not go any days without medication.

## 2014-11-17 NOTE — Telephone Encounter (Signed)
Met with Dr. Adele Schilder to inform of patient's request for refill of his prescribed Sertraline as stated he would run out on 11/27/14.  New order was e-scribed to patient's [facility] on [address] per authorization of Dr. Adele Schilder.  Called patient back to inform order was sent and reminded patient of need to keep his scheduled appointment with Dr. Adele Schilder on 11/30/14.

## 2014-11-30 ENCOUNTER — Encounter (HOSPITAL_COMMUNITY): Payer: Self-pay | Admitting: Psychiatry

## 2014-11-30 ENCOUNTER — Ambulatory Visit (INDEPENDENT_AMBULATORY_CARE_PROVIDER_SITE_OTHER): Payer: 59 | Admitting: Psychiatry

## 2014-11-30 VITALS — BP 112/76 | HR 84 | Ht 69.0 in | Wt 202.8 lb

## 2014-11-30 DIAGNOSIS — F250 Schizoaffective disorder, bipolar type: Secondary | ICD-10-CM | POA: Diagnosis not present

## 2014-11-30 MED ORDER — SERTRALINE HCL 100 MG PO TABS: 100.0000 mg | ORAL_TABLET | Freq: Every day | ORAL | Status: DC

## 2014-11-30 MED ORDER — RISPERIDONE 2 MG PO TABS
2.0000 mg | ORAL_TABLET | Freq: Every day | ORAL | Status: DC
Start: 2014-11-30 — End: 2015-02-25

## 2014-11-30 NOTE — Progress Notes (Signed)
[facility] Progress Note  Brandon Oliver [id number] 69 y.o.  11/30/2014 1:38 PM  Chief Complaint:  Medication management and followup.    History of Present Illness:  Brandon Oliver came for his followup appointment.  He is taking his Risperdal and Zoloft as prescribed.  He denies any irritability, anger, mood swing.  Denies any paranoia or any hallucination.  He is concerned about his weight gain and despite recommendation he continues to eat out and unable to control his sugar craving.  Though he promised that he will watch his calorie intake and to regular exercise.  We received collateral information from his primary care physician and blood work which was done in December 2015.  His triglycerides are slightly increase but his BUN, CMP, creatinine, CBC was normal.  Patient has no tremors or shakes.  His sleep is good.  He denies any irritability .  He continues to involve in church and he's been very social.  His energy level is good.  Patient denies drinking or using any illegal substances.  Patient lives by himself .  He has no children.    Past Psychiatric History/Hospitalization(s) Patient has one psychiatric hospitalization in 2005 when he was admitted to behavioral [facility] because of psychosis and paranoia. Anxiety: No Bipolar Disorder: No Depression: No Mania: No Psychosis: Yes Schizophrenia: No Personality Disorder: No Hospitalization for psychiatric illness: Yes History of Electroconvulsive Shock Therapy: No Prior Suicide Attempts: No  Medical History; Patient has hyperlipidemia and history of myocardial infarction.  His primary care physician is Dr. Harrington Challenger at [facility].  Review of Systems  Constitutional: Negative for weight loss.  HENT: Negative.   Cardiovascular: Negative.   Musculoskeletal: Negative.   Skin: Negative for itching and rash.  Neurological: Negative.  Negative for tingling and tremors.    Psychiatric: Agitation:  No Hallucination: No Depressed Mood: No Insomnia: No Hypersomnia: No Altered Concentration: No Feels Worthless: No Grandiose Ideas: No Belief In Special Powers: No New/Increased Substance Abuse: No Compulsions: No  Neurologic: Headache: No Seizure: No Paresthesias: No      Medication List       This list is accurate as of: 11/30/14  1:39 PM.  Always use your most recent med list.               aspirin 81 MG tablet  Take 81 mg by mouth daily.     carvedilol 3.125 MG tablet  Commonly known as:  COREG  Take 3.125 mg by mouth 2 (two) times daily with a meal.     Fish Oil 300 MG Caps  Take by mouth 2 (two) times daily.     multivitamin tablet  Take 1 tablet by mouth daily.     risperiDONE 2 MG tablet  Commonly known as:  RISPERDAL  Take 1 tablet (2 mg total) by mouth at bedtime.     sertraline 100 MG tablet  Commonly known as:  ZOLOFT  Take 1 tablet (100 mg total) by mouth daily.     simvastatin 40 MG tablet  Commonly known as:  ZOCOR  Take 40 mg by mouth daily.        No results found for this or any previous visit (from the past 2160 hour(s)).    Physical Exam: Constitutional:  BP 112/76 mmHg  Pulse 84  Ht 5\' 9"  (1.753 m)  Wt 202 lb 12.8 oz (91.989 kg)  BMI 29.93 kg/m2  Musculoskeletal: Strength & Muscle Tone: within normal limits Gait & Station: normal  Patient leans: Patient has a normal posture  Mental Status Examination;  Patient is casually dressed and groomed.  He is pleasant and cooperative.  He maintained good eye contact.  His speech is slow but coherent.  His thought processes slow but logical and goal-directed.  His attention and concentration is good.  He denies any active or passive suicidal thoughts or homicidal thoughts.  There were no paranoia, delusions or any obsessions present at this time.  His psychomotor activity is normal.  He has no tremors or shakes.  His fund of knowledge is adequate.  His memory intact.  His attention and  concentration is fair.  He described his mood is anxious and his affect is mood appropriate.  He is alert and oriented x3.  His insight judgment and impulse control is okay.   Review of Psycho-Social Stressors (1), Decision to obtain old records (1), Review of Last Therapy Session (1) and Review of Medication Regimen & Side Effects (2)  Assessment: Axis I: Schizoaffective disorder, mild obesity  Axis II: Deferred  Axis III:  Past Medical History  Diagnosis Date  . Hyperlipidemia   . Myocardial infarction   . Schizoaffective disorder, bipolar type     Plan:  Patient is fairly stable on his current medication.  He does not have any side effects.  Continue Risperdal 2 mg at bedtime and Zoloft 100 mg daily.  Discuss his obesity and encourage weight loss program and watching his calorie intake and to regular exercise.  I reviewed blood work results which was done in December 2015 .  Recommended to call us back if he has any question or any concern.  Follow-up in 6 months.  Camari Quintanilla T., MD 11/30/2014

## 2015-01-27 ENCOUNTER — Encounter (HOSPITAL_COMMUNITY): Payer: Self-pay | Admitting: *Deleted

## 2015-01-27 ENCOUNTER — Other Ambulatory Visit: Payer: Self-pay | Admitting: Gastroenterology

## 2015-02-07 ENCOUNTER — Ambulatory Visit (HOSPITAL_COMMUNITY)
Admission: RE | Admit: 2015-02-07 | Discharge: 2015-02-07 | Disposition: A | Discharge status: Home / Self Care | Payer: Medicare Other | Source: Ambulatory Visit | Attending: Gastroenterology | Admitting: Gastroenterology

## 2015-02-07 ENCOUNTER — Encounter (HOSPITAL_COMMUNITY)
Admission: RE | Disposition: A | Discharge status: Home / Self Care | Payer: Self-pay | Source: Ambulatory Visit | Attending: Gastroenterology

## 2015-02-07 ENCOUNTER — Ambulatory Visit (HOSPITAL_COMMUNITY): Payer: Medicare Other | Admitting: Certified Registered Nurse Anesthetist

## 2015-02-07 ENCOUNTER — Encounter (HOSPITAL_COMMUNITY): Payer: Self-pay

## 2015-02-07 DIAGNOSIS — F209 Schizophrenia, unspecified: Secondary | ICD-10-CM | POA: Insufficient documentation

## 2015-02-07 DIAGNOSIS — Z955 Presence of coronary angioplasty implant and graft: Secondary | ICD-10-CM | POA: Insufficient documentation

## 2015-02-07 DIAGNOSIS — Z1211 Encounter for screening for malignant neoplasm of colon: Secondary | ICD-10-CM | POA: Diagnosis present

## 2015-02-07 DIAGNOSIS — D122 Benign neoplasm of ascending colon: Secondary | ICD-10-CM | POA: Diagnosis not present

## 2015-02-07 DIAGNOSIS — I252 Old myocardial infarction: Secondary | ICD-10-CM | POA: Insufficient documentation

## 2015-02-07 DIAGNOSIS — Z800 Family history of malignant neoplasm of digestive organs: Secondary | ICD-10-CM | POA: Diagnosis not present

## 2015-02-07 DIAGNOSIS — I2510 Atherosclerotic heart disease of native coronary artery without angina pectoris: Secondary | ICD-10-CM | POA: Diagnosis not present

## 2015-02-07 DIAGNOSIS — F319 Bipolar disorder, unspecified: Secondary | ICD-10-CM | POA: Diagnosis not present

## 2015-02-07 HISTORY — PX: COLONOSCOPY WITH PROPOFOL: SHX5780

## 2015-02-07 HISTORY — DX: Unspecified retinal disorder: H35.9

## 2015-02-07 HISTORY — DX: Unspecified cataract: H26.9

## 2015-02-07 HISTORY — DX: Personal history of urinary calculi: Z87.442

## 2015-02-07 SURGERY — COLONOSCOPY WITH PROPOFOL
Anesthesia: Monitor Anesthesia Care

## 2015-02-07 MED ORDER — SODIUM CHLORIDE 0.9 % IV SOLN: INTRAVENOUS | Status: DC

## 2015-02-07 MED ORDER — LACTATED RINGERS IV SOLN
INTRAVENOUS | Status: DC
  Administered 2015-02-07: 12:00:00 via INTRAVENOUS

## 2015-02-07 MED ORDER — PROPOFOL INFUSION 10 MG/ML OPTIME
INTRAVENOUS | Status: DC | PRN
  Administered 2015-02-07: 180 ug/kg/min via INTRAVENOUS

## 2015-02-07 MED ORDER — LIDOCAINE HCL (CARDIAC) 20 MG/ML IV SOLN
INTRAVENOUS | Status: DC | PRN
  Administered 2015-02-07: 100 mg via INTRAVENOUS

## 2015-02-07 MED ORDER — PROPOFOL 10 MG/ML IV BOLUS
INTRAVENOUS | Status: DC | PRN
  Administered 2015-02-07: 20 mg via INTRAVENOUS
  Administered 2015-02-07: 10 mg via INTRAVENOUS

## 2015-02-07 MED ORDER — PROPOFOL 10 MG/ML IV BOLUS
INTRAVENOUS | Status: AC
  Filled 2015-02-07: qty 20

## 2015-02-07 MED ORDER — LIDOCAINE HCL (CARDIAC) 20 MG/ML IV SOLN
INTRAVENOUS | Status: AC
  Filled 2015-02-07: qty 5

## 2015-02-07 SURGICAL SUPPLY — 22 items

## 2015-02-07 NOTE — H&P (Signed)
  Procedure: Screening colonoscopy. Father diagnosed with colon cancer at age 69. Normal screening colonoscopies performed in 2005 and 2010  History: The patient is a 69 year old male born [birth year]. He is scheduled to undergo a repeat screening colonoscopy today.  Past medical history: Hypercholesterolemia. Coronary artery disease. Coronary artery stent placement.  Medication allergies: Penicillin  Exam: The patient is alert and lying comfortably on the endoscopy stretcher. Abdomen is soft and nontender to palpation. Lungs are clear to auscultation. Cardiac exam reveals a regular rhythm.  Plan: Proceed with screening colonoscopy

## 2015-02-07 NOTE — Op Note (Signed)
Procedure: Screening colonoscopy. Father diagnosed with colon cancer at age 69. Normal screening colonoscopies performed in 2005 and 2010  Endoscopist: Earle Gell  Premedication: Propofol administered by anesthesia  Procedure: The patient was placed in the left lateral decubitus position. Anal inspection and digital rectal exam were normal. The Pentax pediatric colonoscope was introduced into the rectum and advanced to the cecum. A normal-appearing appendiceal this and ileocecal valve were identified. Colonic preparation for the exam today was good. Withdrawal time was 13 minutes  Rectum. Normal. Retroflex view of the distal rectum was normal  Sigmoid colon and descending colon. Normal  Splenic flexure. Normal  Transverse colon. Normal  Hepatic flexure. Normal  Ascending colon. From the proximal ascending colon, a 4 mm sessile polyp was removed with the cold biopsy forceps  Cecum and ileocecal valve. Normal  Assessment: A diminutive polyp was removed from the ascending colon. Otherwise normal colonoscopy  Recommendation: Schedule repeat colonoscopy in 5 years

## 2015-02-07 NOTE — Transfer of Care (Signed)
Immediate Anesthesia Transfer of Care Note  Patient: Brandon Oliver  Procedure(s) Performed: Procedure(s): COLONOSCOPY WITH PROPOFOL (N/A)  Patient Location: endoscopy  Anesthesia Type:MAC  Level of Consciousness:  sedated, patient cooperative and responds to stimulation  Airway & Oxygen Therapy:Patient Spontanous Breathing and Patient connected to face mask oxgen  Post-op Assessment:  Report given to endo RN and Post -op Vital signs reviewed and stable  Post vital signs:  Reviewed and stable  Last Vitals:  Filed Vitals:   02/07/15 1130  BP: 141/78  Pulse: 83  Temp: 36.6 C  Resp: 16    Complications: No apparent anesthesia complications

## 2015-02-07 NOTE — Anesthesia Postprocedure Evaluation (Signed)
  Anesthesia Post-op Note  Patient: Brandon Oliver  Procedure(s) Performed: Procedure(s) (LRB): COLONOSCOPY WITH PROPOFOL (N/A)  Patient Location: PACU  Anesthesia Type: MAC  Level of Consciousness: awake and alert   Airway and Oxygen Therapy: Patient Spontanous Breathing  Post-op Pain: mild  Post-op Assessment: Post-op Vital signs reviewed, Patient's Cardiovascular Status Stable, Respiratory Function Stable, Patent Airway and No signs of Nausea or vomiting  Last Vitals:  Filed Vitals:   02/07/15 1350  BP: 116/66  Pulse: 66  Temp:   Resp: 12    Post-op Vital Signs: stable   Complications: No apparent anesthesia complications

## 2015-02-07 NOTE — Anesthesia Preprocedure Evaluation (Signed)
Anesthesia Evaluation  Patient identified by MRN, date of birth, ID band Patient awake    Reviewed: Allergy & Precautions, NPO status , Patient's Chart, lab work & pertinent test results  Airway Mallampati: II  TM Distance: >3 FB Neck ROM: Full    Dental no notable dental hx.    Pulmonary neg pulmonary ROS,  breath sounds clear to auscultation  Pulmonary exam normal       Cardiovascular + CAD, + Past MI and + Cardiac Stents (1998) Normal cardiovascular examRhythm:Regular Rate:Normal     Neuro/Psych Bipolar Disorder Schizophrenia negative neurological ROS  negative psych ROS   GI/Hepatic negative GI ROS, Neg liver ROS,   Endo/Other  negative endocrine ROS  Renal/GU negative Renal ROS  negative genitourinary   Musculoskeletal negative musculoskeletal ROS (+)   Abdominal   Peds negative pediatric ROS (+)  Hematology negative hematology ROS (+)   Anesthesia Other Findings   Reproductive/Obstetrics negative OB ROS                             Anesthesia Physical Anesthesia Plan  ASA: III  Anesthesia Plan: MAC   Post-op Pain Management:    Induction:   Airway Management Planned: Simple Face Mask  Additional Equipment:   Intra-op Plan:   Post-operative Plan:   Informed Consent: I have reviewed the patients History and Physical, chart, labs and discussed the procedure including the risks, benefits and alternatives for the proposed anesthesia with the patient or authorized representative who has indicated his/her understanding and acceptance.   Dental advisory given  Plan Discussed with: CRNA  Anesthesia Plan Comments:         Anesthesia Quick Evaluation

## 2015-02-08 ENCOUNTER — Encounter (HOSPITAL_COMMUNITY): Payer: Self-pay | Admitting: Gastroenterology

## 2015-02-25 ENCOUNTER — Other Ambulatory Visit (HOSPITAL_COMMUNITY): Payer: Self-pay | Admitting: Psychiatry

## 2015-02-27 NOTE — Telephone Encounter (Signed)
One time 90 day refill of patient's prescribed Risperdal authorized by Dr. Adele Schilder as he was seen 11/30/14 with plan to return in 6 months if no concerns.

## 2015-05-01 ENCOUNTER — Other Ambulatory Visit (HOSPITAL_COMMUNITY): Payer: Self-pay | Admitting: Psychiatry

## 2015-05-02 ENCOUNTER — Other Ambulatory Visit (HOSPITAL_COMMUNITY): Payer: Self-pay | Admitting: Psychiatry

## 2015-05-02 DIAGNOSIS — F250 Schizoaffective disorder, bipolar type: Secondary | ICD-10-CM

## 2015-05-02 MED ORDER — SERTRALINE HCL 100 MG PO TABS: 100.0000 mg | ORAL_TABLET | Freq: Every day | ORAL | Status: DC

## 2015-05-27 ENCOUNTER — Other Ambulatory Visit (HOSPITAL_COMMUNITY): Payer: Self-pay | Admitting: Psychiatry

## 2015-05-30 ENCOUNTER — Other Ambulatory Visit (HOSPITAL_COMMUNITY): Payer: Self-pay | Admitting: Psychiatry

## 2015-05-30 MED ORDER — RISPERIDONE 2 MG PO TABS: ORAL_TABLET | ORAL | Status: DC

## 2015-06-05 ENCOUNTER — Ambulatory Visit (INDEPENDENT_AMBULATORY_CARE_PROVIDER_SITE_OTHER): Payer: 59 | Admitting: Psychiatry

## 2015-06-05 ENCOUNTER — Encounter (HOSPITAL_COMMUNITY): Payer: Self-pay | Admitting: Psychiatry

## 2015-06-05 VITALS — BP 120/79 | HR 72 | Ht 69.0 in | Wt 203.0 lb

## 2015-06-05 DIAGNOSIS — F250 Schizoaffective disorder, bipolar type: Secondary | ICD-10-CM

## 2015-06-05 NOTE — Progress Notes (Signed)
[facility] Progress Note  Brandon Oliver [id number] 69 y.o.  06/05/2015 12:01 PM  Chief Complaint:  Medication management and followup.    History of Present Illness:  Brandon Oliver came for his followup appointment.  He is taking his medication as prescribed.  He denies any paranoia, hallucination, irritability or any anger.  His sleep is good.  He is trying to keep himself busy and active.  He is involved in church related activities.  He does not want to reduce or try a different medication.  He believed his medicine is working fine.  He has no side effects including any tremors, shakes or any EPS.  His appetite is okay.  His vitals are stable.  He is taking Risperdal 2 mg at bedtime and Zoloft 100 mg daily.  Patient denies drinking or using any illegal substances.  He is is scheduled to see Dr. Harrington Challenger on December 13 for physical and he will do blood work.  Patient lives by himself .  He has no children.    Past Psychiatric History/Hospitalization(s) Patient has one psychiatric hospitalization in 2005 when he was admitted to behavioral [facility] because of psychosis and paranoia. Anxiety: No Bipolar Disorder: No Depression: No Mania: No Psychosis: Yes Schizophrenia: No Personality Disorder: No Hospitalization for psychiatric illness: Yes History of Electroconvulsive Shock Therapy: No Prior Suicide Attempts: No  Medical History; Patient has hyperlipidemia and history of myocardial infarction.  His primary care physician is Dr. Harrington Challenger at [facility].  Review of Systems  Constitutional: Negative for weight loss.  HENT: Negative.   Cardiovascular: Negative.   Musculoskeletal: Negative.   Skin: Negative for itching and rash.  Neurological: Negative.  Negative for tingling and tremors.    Psychiatric: Agitation: No Hallucination: No Depressed Mood: No Insomnia: No Hypersomnia: No Altered Concentration: No Feels Worthless: No Grandiose Ideas: No Belief  In Special Powers: No New/Increased Substance Abuse: No Compulsions: No  Neurologic: Headache: No Seizure: No Paresthesias: No      Medication List       This list is accurate as of: 06/05/15 12:01 PM.  Always use your most recent med list.               aspirin 81 MG tablet  Take 81 mg by mouth daily.     carvedilol 3.125 MG tablet  Commonly known as:  COREG  Take 3.125 mg by mouth 2 (two) times daily with a meal.     Fish Oil 1000 MG Caps  Take 2,000 mg by mouth daily.     multivitamin tablet  Take 1 tablet by mouth daily.     polyethylene glycol-electrolytes 420 G solution  Commonly known as:  NuLYTELY/GoLYTELY  AS DIRECTED SEE SHEET FOR INSTRUCTIONS ORALLY     risperiDONE 2 MG tablet  Commonly known as:  RISPERDAL  TAKE 1 TABLET (2 MG TOTAL) BY MOUTH AT BEDTIME.     sertraline 100 MG tablet  Commonly known as:  ZOLOFT  Take 1 tablet (100 mg total) by mouth daily.     simvastatin 40 MG tablet  Commonly known as:  ZOCOR  Take 40 mg by mouth daily.        No results found for this or any previous visit (from the past 2160 hour(s)).    Physical Exam: Constitutional:  BP 120/79 mmHg  Pulse 72  Ht 5\' 9"  (1.753 m)  Wt 203 lb (92.08 kg)  BMI 29.96 kg/m2  Musculoskeletal: Strength & Muscle Tone: within normal  limits Gait & Station: normal Patient leans: Patient has a normal posture  Mental Status Examination;  Patient is casually dressed and groomed.  He is pleasant and cooperative.  He maintained good eye contact.  His speech is slow but coherent.  His thought processes logical and goal-directed.  His attention and concentration is good.  He denies any active or passive suicidal thoughts or homicidal thoughts.  There were no paranoia, delusions or any obsessions present at this time.  His psychomotor activity is normal.  He has no tremors or shakes.  His fund of knowledge is adequate.  His memory intact.  His attention and concentration is fair.  He  described his mood is anxious and his affect is mood appropriate.  He is alert and oriented x3.  His insight judgment and impulse control is okay.   Review of Last Therapy Session (1) and Review of Medication Regimen & Side Effects (2)  Assessment: Axis I: Schizoaffective disorder, mild obesity  Axis II: Deferred  Axis III:  Past Medical History  Diagnosis Date  . Hyperlipidemia   . Myocardial infarction [facility])     coronary stents x2 at that time.  . Schizoaffective disorder, bipolar type ([place])     sees MD every 3- 6 months- controlled.  Marland Kitchen History of kidney stones     x3 episodes  . Cataracts, both eyes     none mature  . Retina disorder     "floaters" ? tear- Dr. Zigmund Daniel follows    Plan:  Patient is stable on his current medication.  I offer lowering Risperdal but patient denied .  He wants to continue his current dosage.  He does not have any side effects.  He is a scheduled to see his primary care physician Dr. Harrington Challenger on December 13 for physical checkup and and will blood work.  I will Risperdal 2 mg at bedtime and Zoloft 100 mg daily.  He recently refilled his prescription and he does not need a new prescription at this time.  Discuss his obesity and encourage weight loss program and to watch his calorie intake and recommended to regular exercise. Recommended to call us back if he has any question or any concern.  Follow-up in 6 months.  Brandon Oliver T., MD 06/05/2015

## 2015-06-30 ENCOUNTER — Ambulatory Visit (INDEPENDENT_AMBULATORY_CARE_PROVIDER_SITE_OTHER): Payer: Medicare Other | Admitting: Ophthalmology

## 2015-06-30 DIAGNOSIS — H43813 Vitreous degeneration, bilateral: Secondary | ICD-10-CM | POA: Diagnosis not present

## 2015-06-30 DIAGNOSIS — D3132 Benign neoplasm of left choroid: Secondary | ICD-10-CM | POA: Diagnosis not present

## 2015-06-30 DIAGNOSIS — H33301 Unspecified retinal break, right eye: Secondary | ICD-10-CM

## 2015-07-27 ENCOUNTER — Other Ambulatory Visit (HOSPITAL_COMMUNITY): Payer: Self-pay | Admitting: Psychiatry

## 2015-07-27 DIAGNOSIS — F250 Schizoaffective disorder, bipolar type: Secondary | ICD-10-CM

## 2015-07-27 NOTE — Telephone Encounter (Signed)
Met with Dr. Vassie Moment, helping to cover for Dr. Adele Schilder out this week, who approved a new 90 day order for patient's Sertraline 100 mg, one a day, #90 as patient was last evaluated on 06/05/15 and continued by Dr. Adele Schilder that date with plan for patient to return in 6 months from then.  New 90 day refill order e-scribed to patient's [facility] on [address].

## 2015-08-26 ENCOUNTER — Other Ambulatory Visit (HOSPITAL_COMMUNITY): Payer: Self-pay | Admitting: Psychiatry

## 2015-08-29 ENCOUNTER — Other Ambulatory Visit (HOSPITAL_COMMUNITY): Payer: Self-pay | Admitting: Psychiatry

## 2015-08-29 DIAGNOSIS — F250 Schizoaffective disorder, bipolar type: Secondary | ICD-10-CM

## 2015-08-29 MED ORDER — RISPERIDONE 2 MG PO TABS: ORAL_TABLET | ORAL | Status: DC

## 2015-08-30 ENCOUNTER — Encounter: Payer: Self-pay | Admitting: Interventional Cardiology

## 2015-08-30 ENCOUNTER — Ambulatory Visit (INDEPENDENT_AMBULATORY_CARE_PROVIDER_SITE_OTHER): Payer: Medicare Other | Admitting: Interventional Cardiology

## 2015-08-30 VITALS — BP 102/68 | HR 80 | Ht 69.0 in | Wt 203.8 lb

## 2015-08-30 DIAGNOSIS — F259 Schizoaffective disorder, unspecified: Secondary | ICD-10-CM

## 2015-08-30 DIAGNOSIS — I2510 Atherosclerotic heart disease of native coronary artery without angina pectoris: Secondary | ICD-10-CM | POA: Diagnosis not present

## 2015-08-30 DIAGNOSIS — I25709 Atherosclerosis of coronary artery bypass graft(s), unspecified, with unspecified angina pectoris: Secondary | ICD-10-CM | POA: Diagnosis not present

## 2015-08-30 DIAGNOSIS — E785 Hyperlipidemia, unspecified: Secondary | ICD-10-CM

## 2015-08-30 NOTE — Progress Notes (Signed)
Cardiology Office Note   Date:  08/30/2015   ID:  Brandon Oliver, DOB Apr 12, 1946, MRN [id number]  PCP:   [name], MD  Cardiologist:  [name], MD   Chief Complaint  Patient presents with  . Hypertension      History of Present Illness: Brandon Oliver is a 70 y.o. male who presents for  CAD, history of bare metal stent circumflex 1998, hyperlipidemia, and schizoaffective disorder.   Overall doing well. Denies angina , dyspnea, and swelling. Does have occasional orthostatic dizziness. This occurs if he stands rapidly after sitting for prolonged periods of time. He denies orthopnea. He exercises twice per week in the gym. He gets in 20 minutes of cardio.    Past Medical History  Diagnosis Date  . Hyperlipidemia   . Myocardial infarction [facility])     coronary stents x2 at that time.  . Schizoaffective disorder, bipolar type ([place])     sees MD every 3- 6 months- controlled.  Marland Kitchen History of kidney stones     x3 episodes  . Cataracts, both eyes     none mature  . Retina disorder     "floaters" ? tear- Dr. Zigmund Daniel follows    Past Surgical History  Procedure Laterality Date  . Hemorroidectomy    . Cardiac catheterization      stents x2- Dr. Linard Millers follows  . Colonoscopy with propofol N/A 02/07/2015    Procedure: COLONOSCOPY WITH PROPOFOL;  Surgeon: Garlan Fair, MD;  Location: [facility];  Service: Endoscopy;  Laterality: N/A;     Current Outpatient Prescriptions  Medication Sig Dispense Refill  . aspirin 81 MG tablet Take 81 mg by mouth daily.    . carvedilol (COREG) 3.125 MG tablet Take 3.125 mg by mouth 2 (two) times daily with a meal.     . Coenzyme Q10 (COQ-10) 50 MG CAPS Take 50 mg by mouth daily.    . Multiple Vitamin (MULTIVITAMIN) tablet Take 1 tablet by mouth daily.    . Omega-3 Fatty Acids (FISH OIL) 1000 MG CAPS Take 1,000 mg by mouth daily.     . risperiDONE (RISPERDAL) 2 MG tablet TAKE 1 TABLET (2 MG TOTAL) BY MOUTH AT BEDTIME. 90  tablet 0  . sertraline (ZOLOFT) 100 MG tablet TAKE 1 TABLET BY MOUTH EVERY DAY 90 tablet 0  . simvastatin (ZOCOR) 40 MG tablet Take 40 mg by mouth daily.      No current facility-administered medications for this visit.    Allergies:   Penicillins and Benadryl    Social History:  The patient  reports that he has never smoked. He has never used smokeless tobacco. He reports that he does not drink alcohol or use illicit drugs.   Family History:  The patient's family history includes Cancer in his father, maternal grandmother, mother, paternal aunt, paternal grandfather, paternal uncle, and paternal uncle; Diabetes in his maternal aunt.    ROS:  Please see the history of present illness.   Otherwise, review of systems are positive for  Joint discomfort , orthostatic dizziness , otherwise unremarkable..   All other systems are reviewed and negative.    PHYSICAL EXAM: VS:  BP 102/68 mmHg  Pulse 80  Ht 5\' 9"  (1.753 m)  Wt 203 lb 12.8 oz (92.443 kg)  BMI 30.08 kg/m2  SpO2 96% , BMI Body mass index is 30.08 kg/(m^2). GEN: Well nourished, well developed, in no acute distress HEENT: normal Neck: no JVD, carotid bruits, or  masses Cardiac: RRR.  There is no murmur, rub, or gallop. There is no edema. Respiratory:  clear to auscultation bilaterally, normal work of breathing. GI: soft, nontender, nondistended, + BS MS: no deformity or atrophy Skin: warm and dry, no rash Neuro:  Strength and sensation are intact Psych: euthymic mood, full affect   EKG:  EKG is ordered today. The ekg reveals  Total sinus rhythm with evidence of an old inferior infarct. Unchanged from prior tracings.   Recent Labs: No results found for requested labs within last 365 days.    Lipid Panel No results found for: CHOL, TRIG, HDL, CHOLHDL, VLDL, LDLCALC, LDLDIRECT    Wt Readings from Last 3 Encounters:  08/30/15 203 lb 12.8 oz (92.443 kg)  06/05/15 203 lb (92.08 kg)  02/07/15 202 lb (91.627 kg)       Other studies Reviewed: Additional studies/ records that were reviewed today include:  Electronic health record was reviewed.. The findings include  No recent laboratory data all correspondence from his primary physician at [facility].  The electronic health record is here demonstrated that his neck size treadmill test was performed last year and did not reveal evidence of ischemia with the patient complaining 6 METs of activity.    ASSESSMENT AND PLAN:   1. Native coronary artery disease with prior right coronary stent , 1998  asymptomatic. He has history of bare metal stent in the circumflex coronary artery , 1998 - EKG 12-Lead  2. Hyperlipidemia  followed by primary care physician. When checked last year was stable.  3. Schizoaffective disorder, unspecified type ([place])  stable and managed by others   4. Orthostatic dizziness  unable to document orthostatic blood pressure changes.    Current medicines are reviewed at length with the patient today.  The patient has the following concerns regarding medicines:  none.  The following changes/actions have been instituted:     increase aerobic activity to least 20 minutes 3 times per week   Call if orthostatic dizziness worsens. May consider discontinuing low-dose beta blocker therapy.  Labs/ tests ordered today include:  Orders Placed This Encounter  Procedures  . EKG 12-Lead     Disposition:   FU with HS in 1 year  Signed, [name], MD  08/30/2015 1:57 PM    [facility] [address], [place], [place]  [address] Phone: [phone]; Fax: [phone]

## 2015-08-30 NOTE — Patient Instructions (Signed)

## 2015-10-24 ENCOUNTER — Other Ambulatory Visit (HOSPITAL_COMMUNITY): Payer: Self-pay | Admitting: Psychiatry

## 2015-10-25 ENCOUNTER — Other Ambulatory Visit (HOSPITAL_COMMUNITY): Payer: Self-pay | Admitting: Psychiatry

## 2015-10-27 ENCOUNTER — Other Ambulatory Visit (HOSPITAL_COMMUNITY): Payer: Self-pay | Admitting: Psychiatry

## 2015-10-27 DIAGNOSIS — F250 Schizoaffective disorder, bipolar type: Secondary | ICD-10-CM

## 2015-10-27 MED ORDER — RISPERIDONE 2 MG PO TABS: ORAL_TABLET | ORAL | Status: DC

## 2015-10-27 MED ORDER — SERTRALINE HCL 100 MG PO TABS: 100.0000 mg | ORAL_TABLET | Freq: Every day | ORAL | Status: DC

## 2015-12-04 ENCOUNTER — Ambulatory Visit (HOSPITAL_COMMUNITY): Payer: Self-pay | Admitting: Psychiatry

## 2015-12-12 ENCOUNTER — Ambulatory Visit (INDEPENDENT_AMBULATORY_CARE_PROVIDER_SITE_OTHER): Payer: 59 | Admitting: Psychiatry

## 2015-12-12 ENCOUNTER — Encounter (HOSPITAL_COMMUNITY): Payer: Self-pay | Admitting: Psychiatry

## 2015-12-12 VITALS — BP 130/78 | HR 86 | Ht 69.0 in | Wt 209.4 lb

## 2015-12-12 DIAGNOSIS — F250 Schizoaffective disorder, bipolar type: Secondary | ICD-10-CM

## 2015-12-12 MED ORDER — SERTRALINE HCL 100 MG PO TABS: 100.0000 mg | ORAL_TABLET | Freq: Every day | ORAL | Status: DC

## 2015-12-12 MED ORDER — RISPERIDONE 2 MG PO TABS: ORAL_TABLET | ORAL | Status: DC

## 2015-12-12 NOTE — Progress Notes (Signed)
[facility] Progress Note  Brandon Oliver [id number] 70 y.o.  12/12/2015 4:19 PM  Chief Complaint:  Medication management and followup.    History of Present Illness:  [name] came for his followup appointment.  He was last seen in November.  He has been taking his medication as prescribed.  He had blood work in December at his primary care physician.  His CBC chemistry and basic metabolic panel is normal.  His triglyceride was 296.  He is very involved in church and trying to help people who have health issues.  He usually takes his friend to dialysis Center and other doctor's appointment .  We talked about reducing his Risperdal but patient does not want to reduce the dose.  He is not sedated and he likes his current psychiatric medication.  He sleeping good.  He denies any irritability, anger, mania or any psychosis.  His energy level is good.  He denies any feeling of hopelessness.  His appetite is okay.  His vitals are stable.  Patient lives by himself .  He has no children.    Past Psychiatric History/Hospitalization(s) Patient has one psychiatric hospitalization in 2005 when he was admitted to behavioral [facility] because of psychosis and paranoia. Anxiety: No Bipolar Disorder: No Depression: No Mania: No Psychosis: Yes Schizophrenia: No Personality Disorder: No Hospitalization for psychiatric illness: Yes History of Electroconvulsive Shock Therapy: No Prior Suicide Attempts: No  Medical History; Patient has hyperlipidemia and history of myocardial infarction.  His primary care physician is Dr. Harrington Challenger at [place].  Review of Systems  Constitutional: Negative for weight loss.  HENT: Negative.   Cardiovascular: Negative.   Musculoskeletal: Negative.   Skin: Negative for itching and rash.  Neurological: Negative.  Negative for tingling and tremors.    Psychiatric: Agitation: No Hallucination: No Depressed Mood: No Insomnia: No Hypersomnia:  No Altered Concentration: No Feels Worthless: No Grandiose Ideas: No Belief In Special Powers: No New/Increased Substance Abuse: No Compulsions: No  Neurologic: Headache: No Seizure: No Paresthesias: No      Medication List       This list is accurate as of: 12/12/15  4:19 PM.  Always use your most recent med list.               aspirin 81 MG tablet  Take 81 mg by mouth daily.     carvedilol 3.125 MG tablet  Commonly known as:  COREG  Take 3.125 mg by mouth 2 (two) times daily with a meal.     CoQ-10 50 MG Caps  Take 50 mg by mouth daily.     Fish Oil 1000 MG Caps  Take 1,000 mg by mouth daily.     multivitamin tablet  Take 1 tablet by mouth daily.     risperiDONE 2 MG tablet  Commonly known as:  RISPERDAL  TAKE 1 TABLET (2 MG TOTAL) BY MOUTH AT BEDTIME.     sertraline 100 MG tablet  Commonly known as:  ZOLOFT  Take 1 tablet (100 mg total) by mouth daily.     simvastatin 40 MG tablet  Commonly known as:  ZOCOR  Take 40 mg by mouth daily.        No results found for this or any previous visit (from the past 2160 hour(s)).    Physical Exam: Constitutional:  BP 130/78 mmHg  Pulse 86  Ht 5\' 9"  (1.753 m)  Wt 209 lb 6.4 oz (94.983 kg)  BMI 30.91 kg/m2  Musculoskeletal:  Strength & Muscle Tone: within normal limits Gait & Station: normal Patient leans: Patient has a normal posture  Mental Status Examination;  Patient is casually dressed and groomed.  He is pleasant and cooperative.  He maintained good eye contact.  His speech is slow but coherent.  His thought processes logical and goal-directed.  His attention and concentration is good.  He denies any active or passive suicidal thoughts or homicidal thoughts.  There were no paranoia, delusions or any obsessions present at this time.  His psychomotor activity is normal.  He has no tremors or shakes.  His fund of knowledge is adequate.  His memory intact.  His attention and concentration is fair.  He  described his mood is anxious and his affect is mood appropriate.  He is alert and oriented x3.  His insight judgment and impulse control is okay.   Review or order clinical lab tests (1), Review of Last Therapy Session (1) and Review of Medication Regimen & Side Effects (2)  Assessment: Axis I: Schizoaffective disorder, mild obesity  Axis II: Deferred  Axis III:  Past Medical History  Diagnosis Date  . Hyperlipidemia   . Myocardial infarction [facility])     coronary stents x2 at that time.  . Schizoaffective disorder, bipolar type ([place])     sees MD every 3- 6 months- controlled.  Marland Kitchen History of kidney stones     x3 episodes  . Cataracts, both eyes     none mature  . Retina disorder     "floaters" ? tear- Dr. Zigmund Daniel follows    Plan:  Patient is stable on his current medication.  I review his blood work which was done in December.  I offer lowering Risperdal but patient denied .  He wants to continue his current dosage.  He does not have any side effects.  I will Risperdal 2 mg at bedtime and Zoloft 100 mg daily.  He recently refilled his prescription and he does not need a new prescription at this time.  Discuss his obesity and encourage weight loss program and to watch his calorie intake and recommended to regular exercise. Recommended to call us back if he has any question or any concern.  Follow-up in 6 months.  Tiauna Whisnant T., MD 12/12/2015

## 2016-02-22 ENCOUNTER — Emergency Department (HOSPITAL_COMMUNITY): Payer: Medicare Other

## 2016-02-22 ENCOUNTER — Emergency Department (HOSPITAL_COMMUNITY)
Admission: EM | Admit: 2016-02-22 | Discharge: 2016-02-23 | Disposition: A | Discharge status: Home / Self Care | Payer: Medicare Other | Attending: Emergency Medicine | Admitting: Emergency Medicine

## 2016-02-22 ENCOUNTER — Encounter (HOSPITAL_COMMUNITY): Payer: Self-pay | Admitting: *Deleted

## 2016-02-22 DIAGNOSIS — I2510 Atherosclerotic heart disease of native coronary artery without angina pectoris: Secondary | ICD-10-CM | POA: Diagnosis not present

## 2016-02-22 DIAGNOSIS — F419 Anxiety disorder, unspecified: Secondary | ICD-10-CM | POA: Diagnosis not present

## 2016-02-22 DIAGNOSIS — Z7982 Long term (current) use of aspirin: Secondary | ICD-10-CM | POA: Insufficient documentation

## 2016-02-22 DIAGNOSIS — R0602 Shortness of breath: Secondary | ICD-10-CM

## 2016-02-22 DIAGNOSIS — I252 Old myocardial infarction: Secondary | ICD-10-CM | POA: Diagnosis not present

## 2016-02-22 LAB — BASIC METABOLIC PANEL
Anion gap: 9 (ref 5–15)
BUN: 14 mg/dL (ref 6–20)
CHLORIDE: 105 mmol/L (ref 101–111)
CO2: 22 mmol/L (ref 22–32)
Calcium: 9.2 mg/dL (ref 8.9–10.3)
Creatinine, Ser: 0.84 mg/dL (ref 0.61–1.24)
GFR calc Af Amer: >60 mL/min (ref >60)
GLUCOSE: 104 mg/dL — AB (ref 65–99)
POTASSIUM: 4.2 mmol/L (ref 3.5–5.1)
Sodium: 136 mmol/L (ref 135–145)

## 2016-02-22 LAB — CBC
HEMATOCRIT: 43.4 % (ref 39.0–52.0)
Hemoglobin: 14 g/dL (ref 13.0–17.0)
MCH: 31.8 pg (ref 26.0–34.0)
MCHC: 32.3 g/dL (ref 30.0–36.0)
MCV: 98.6 fL (ref 78.0–100.0)
Platelets: 223 10*3/uL (ref 150–400)
RBC: 4.4 MIL/uL (ref 4.22–5.81)
RDW: 13.3 % (ref 11.5–15.5)
WBC: 7.3 10*3/uL (ref 4.0–10.5)

## 2016-02-22 LAB — I-STAT TROPONIN, ED: Troponin i, poc: 0 ng/mL (ref 0.00–0.08)

## 2016-02-22 IMAGING — DX DG CHEST 2V
2 series · 2 of 2 positions shown · non-contrast
Comparison: None.

CLINICAL DATA: Tachycardia and shortness of Breath

EXAM:
CHEST  2 VIEW

[chest pa]
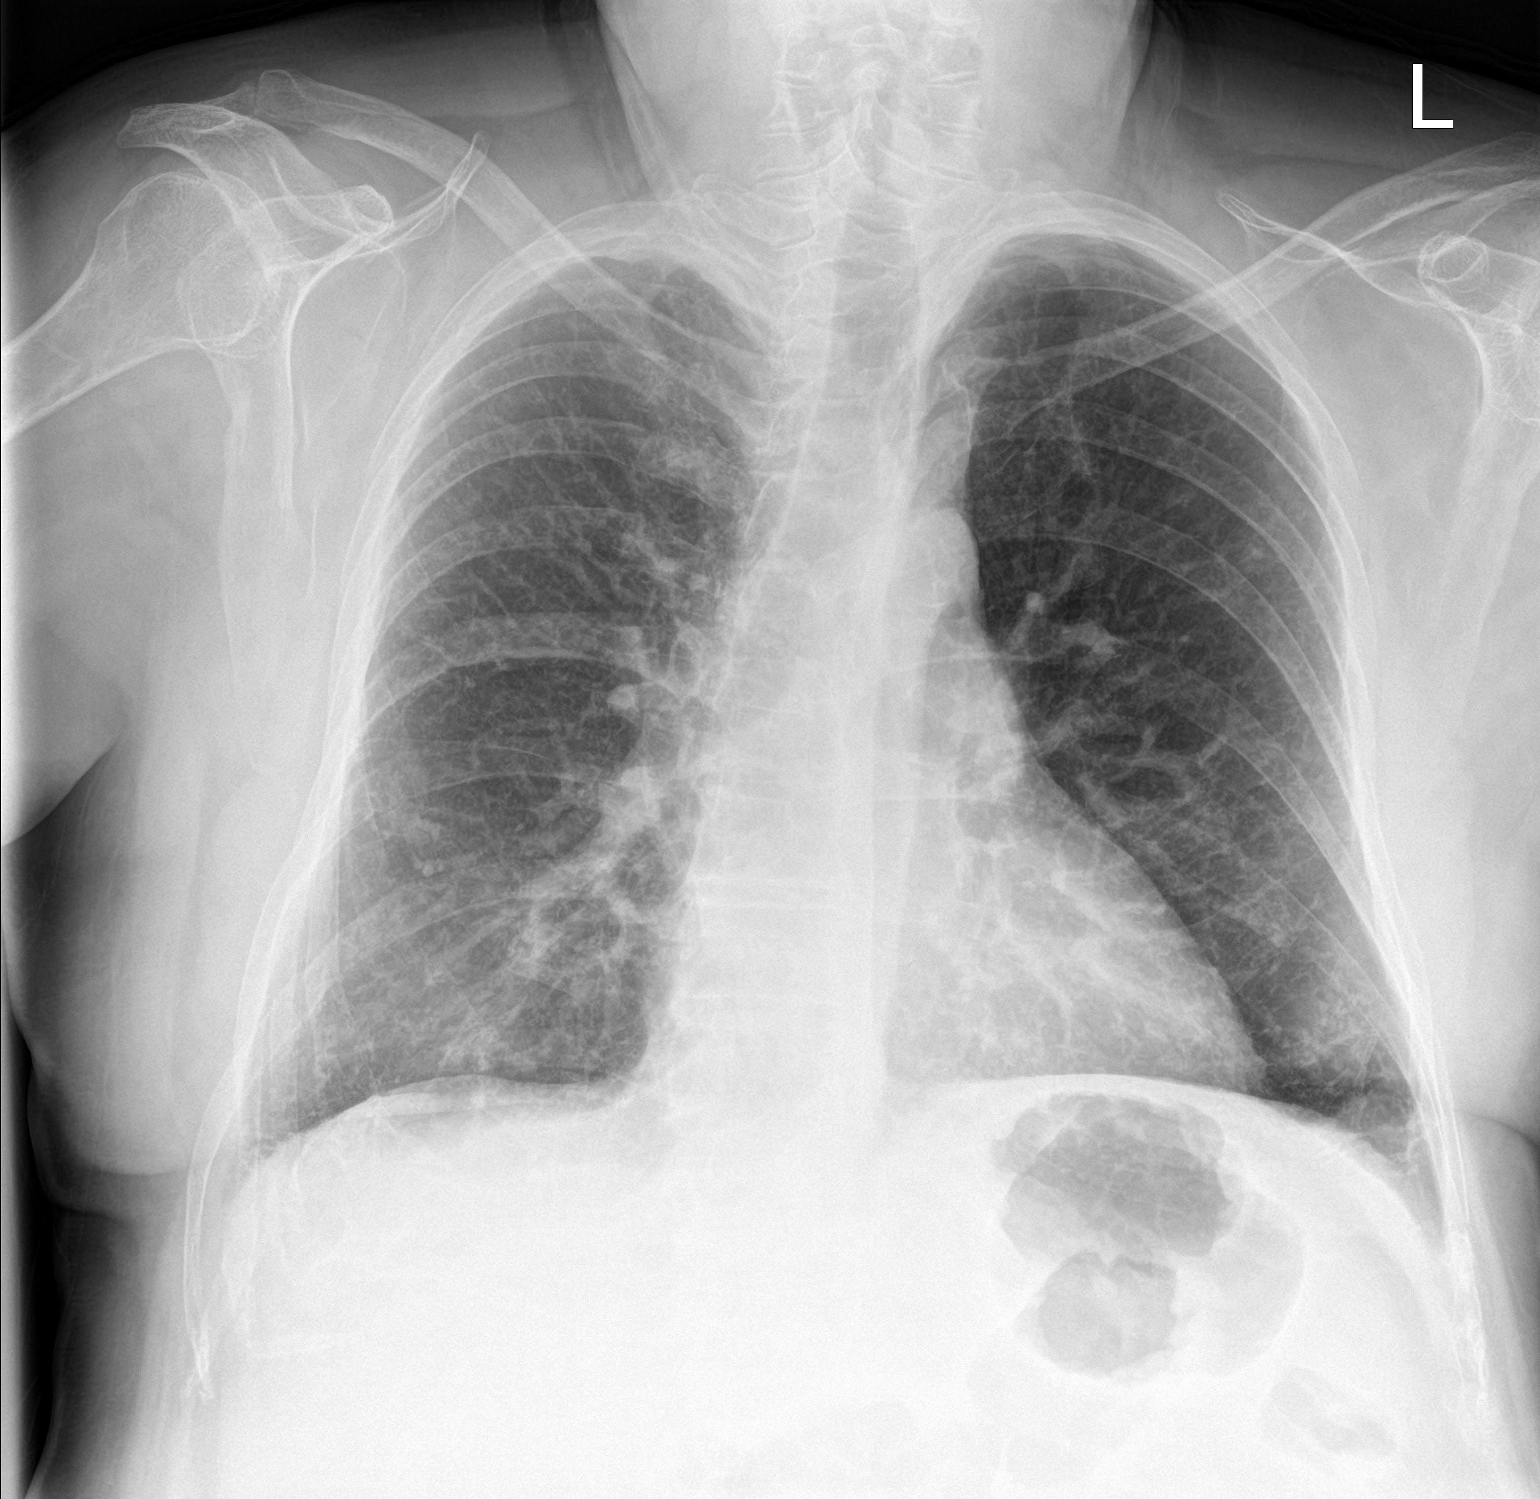

[chest lat]
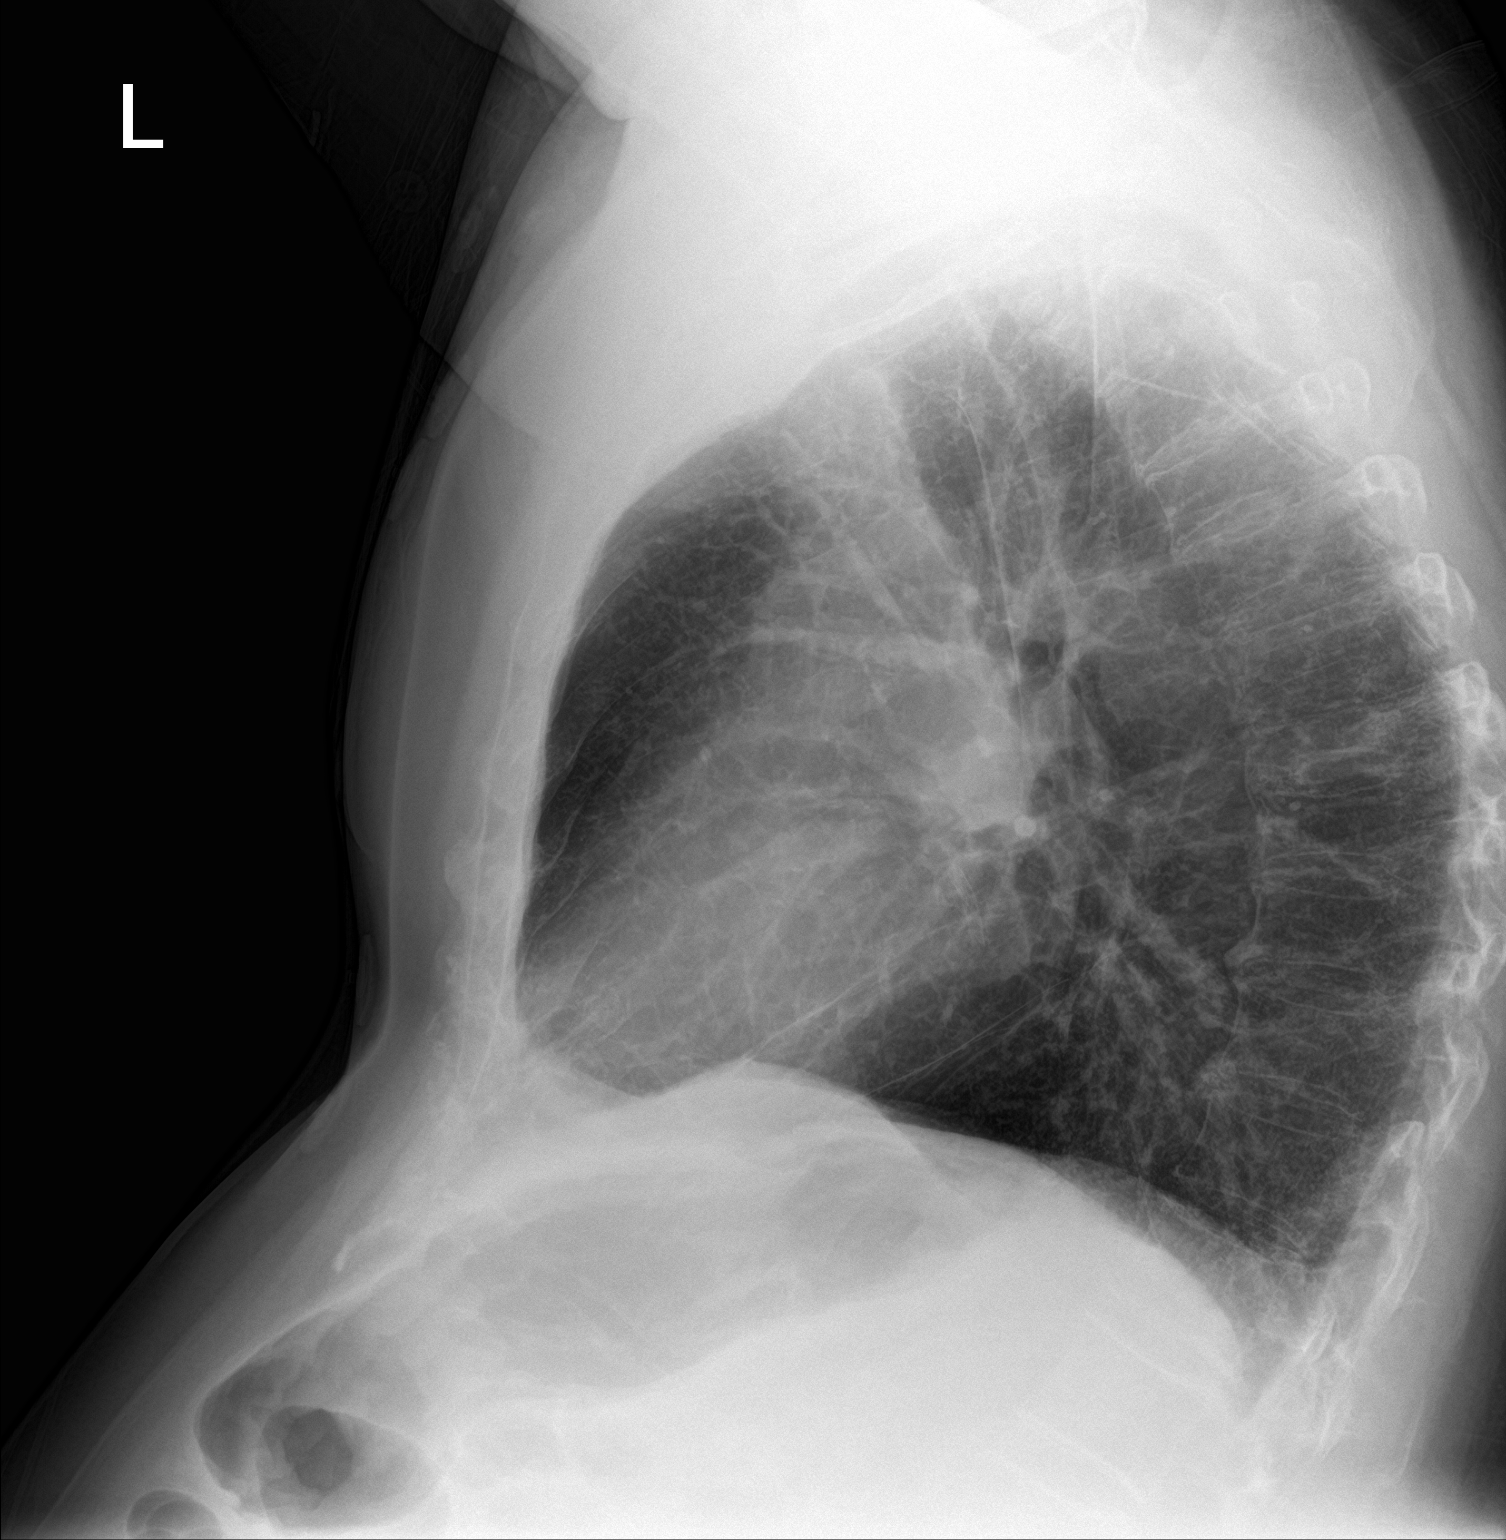

[2 of 2 positions shown; findings below may reference images not displayed]

FINDINGS: Cardiac shadow is within normal limits. The lungs are well aerated
bilaterally. Mild interstitial changes are seen without focal
confluent infiltrate. Degenerative changes of thoracic spine are
noted. No other focal abnormality is seen.
IMPRESSION: Mild interstitial changes likely of a chronic nature.

## 2016-02-22 NOTE — ED Triage Notes (Addendum)
[facility]. Pt reports walking on treadmill at high incline and became sob, HR 122. Pt reports that the sob resolved with rest. Denies having any cp. Sent here due to cardiac history and elevated HR vs anxiety.

## 2016-02-22 NOTE — ED Provider Notes (Signed)
[facility] DEPT Provider Note   CSN: [id number] Arrival date & time: 02/22/16  1739  First Provider Contact:  11:04 PM  By signing my name below, I, Brandon Oliver, attest that this documentation has been prepared under the direction and in the presence of Brandon Greek, MD. Electronically Signed: Reola Oliver, ED Scribe. 02/22/16. 11:14 PM.  History   Chief Complaint Chief Complaint  Patient presents with  . Shortness of Breath  . Anxiety   The history is provided by the patient. No language interpreter was used.   HPI Comments: Brandon Oliver is a 70 y.o. male with a PMHx significant of MI ~19 years ago w/ stent placement, anxiety, and HLD, who presents to the Emergency Department complaining of a sudden onset, resolved episode of SOB onset ~10 hours PTA. Pt reports that he got up from eating dinner and began to exercise on his treadmill during the onset of his episode. He states that he typically exercises on his treadmill twice a week. Pt notes that during the exercise that he went considerably faster than he normally does and noticed that he was moderately SOB. During the episode of SOB, he stopped exercising because he notes that he was very anxious. His episode of SOB resolved after a few minutes of rest, and at that time he checked his HR and watched it lower from the high ~120's down to 102. However, pt reports that his anxiety continued throughout the day because he was concerned about an MI which he has a hx of. Pt went to the [facility] prior to coming into the ED who referred him into the ED. He is currently followed by Dr Brandon Oliver (Cardiology), and notes that he had a stress test "a few years ago" which was normal. Denies CP during the episode.   Past Medical History:  Diagnosis Date  . Cataracts, both eyes    none mature  . History of kidney stones    x3 episodes  . Hyperlipidemia   . Myocardial infarction [facility])    coronary stents x2 at that  time.  Marland Kitchen Retina disorder    "floaters" ? tear- Dr. Zigmund Daniel follows  . Schizoaffective disorder, bipolar type ([place])    sees MD every 3- 6 months- controlled.    Patient Active Problem List   Diagnosis Date Noted  . Coronary atherosclerosis 06/29/2013    Class: Chronic  . Hyperlipidemia 06/29/2013  . Depression, endogenous ([place]) 06/29/2013    Class: Diagnosis of  . Schizoaffective disorder ([place]) 05/20/2012  . GERD (gastroesophageal reflux disease) 11/20/2011    Past Surgical History:  Procedure Laterality Date  . CARDIAC CATHETERIZATION     stents x2- Dr. Linard Millers follows  . COLONOSCOPY WITH PROPOFOL N/A 02/07/2015   Procedure: COLONOSCOPY WITH PROPOFOL;  Surgeon: Garlan Fair, MD;  Location: [facility];  Service: Endoscopy;  Laterality: N/A;  . HEMORROIDECTOMY         Home Medications    Prior to Admission medications   Medication Sig Start Date End Date Taking? Authorizing Provider  aspirin 81 MG tablet Take 81 mg by mouth daily.    Historical Provider, MD  carvedilol (COREG) 3.125 MG tablet Take 3.125 mg by mouth 2 (two) times daily with a meal.  06/01/12   Historical Provider, MD  Coenzyme Q10 (COQ-10) 50 MG CAPS Take 50 mg by mouth daily.    Historical Provider, MD  Multiple Vitamin (MULTIVITAMIN) tablet Take 1 tablet by mouth daily.  Historical Provider, MD  Omega-3 Fatty Acids (FISH OIL) 1000 MG CAPS Take 1,000 mg by mouth daily.     Historical Provider, MD  risperiDONE (RISPERDAL) 2 MG tablet TAKE 1 TABLET (2 MG TOTAL) BY MOUTH AT BEDTIME. 12/12/15   [name], MD  sertraline (ZOLOFT) 100 MG tablet Take 1 tablet (100 mg total) by mouth daily. 12/12/15   [name], MD  simvastatin (ZOCOR) 40 MG tablet Take 40 mg by mouth daily.  06/27/12   Historical Provider, MD    Family History Family History  Problem Relation Age of Onset  . Cancer Mother     breast  . Cancer Father     colon  . Diabetes Maternal Aunt   . Cancer Paternal Aunt     "male"  cancer  . Cancer Paternal Uncle     pancreatic  . Cancer Paternal Grandfather     colon  . Cancer Paternal Uncle     lung  . Cancer Maternal Grandmother     pancreatic    Social History Social History  Substance Use Topics  . Smoking status: Never Smoker  . Smokeless tobacco: Never Used  . Alcohol use No     Allergies   Penicillins and Benadryl [diphenhydramine hcl]   Review of Systems Review of Systems  Respiratory: Positive for shortness of breath.   Cardiovascular: Negative for chest pain.  Psychiatric/Behavioral: The patient is nervous/anxious.   All other systems reviewed and are negative.   Physical Exam Updated Vital Signs BP 147/84   Pulse 77   Temp 97.9 F (36.6 C) (Oral)   Resp 13   SpO2 99%   Physical Exam  Constitutional: He is oriented to person, place, and time. He appears well-developed and well-nourished. No distress.  HENT:  Head: Normocephalic and atraumatic.  Right Ear: Hearing normal.  Left Ear: Hearing normal.  Nose: Nose normal.  Mouth/Throat: Oropharynx is clear and moist and mucous membranes are normal.  Eyes: Conjunctivae and EOM are normal. Pupils are equal, round, and reactive to light.  Neck: Normal range of motion. Neck supple.  Cardiovascular: Regular rhythm, S1 normal and S2 normal.  Exam reveals no gallop and no friction rub.   No murmur heard. Pulmonary/Chest: Effort normal and breath sounds normal. No respiratory distress. He exhibits no tenderness.  Abdominal: Soft. Normal appearance and bowel sounds are normal. There is no hepatosplenomegaly. There is no tenderness. There is no rebound, no guarding, no tenderness at McBurney's point and negative Murphy's sign. No hernia.  Musculoskeletal: Normal range of motion.  Neurological: He is alert and oriented to person, place, and time. He has normal strength. No cranial nerve deficit or sensory deficit. Coordination normal. GCS eye subscore is 4. GCS verbal subscore is 5. GCS motor  subscore is 6.  Skin: Skin is warm, dry and intact. No rash noted. No cyanosis.  Psychiatric: His speech is normal and behavior is normal. Thought content normal. His mood appears anxious.  Nursing note and vitals reviewed.  ED Treatments / Results  DIAGNOSTIC STUDIES: Oxygen Saturation is 98% on RA, normal by my interpretation.   COORDINATION OF CARE: 11:08 PM-Discussed next steps with pt. Pt verbalized understanding and is agreeable with the plan.   Labs (all labs ordered are listed, but only abnormal results are displayed) Labs Reviewed  BASIC METABOLIC PANEL - Abnormal; Notable for the following:       Result Value   Glucose, Bld 104 (*)    All other components  within normal limits  CBC  I-STAT TROPOININ, ED    EKG  EKG Interpretation  Date/Time:  Thursday February 22 2016 17:59:43 EDT Ventricular Rate:  87 PR Interval:  152 QRS Duration: 92 QT Interval:  366 QTC Calculation: 440 R Axis:   -6 Text Interpretation:  Normal sinus rhythm Inferior infarct , age undetermined Abnormal ECG No significant change since last tracing Confirmed by [facility]  MD, CHRISTOPHER [phone]) on 02/22/2016 11:09:26 PM       Radiology Dg Chest 2 View  Result Date: 02/22/2016 CLINICAL DATA:  Tachycardia and shortness of Breath EXAM: CHEST  2 VIEW COMPARISON:  None. FINDINGS: Cardiac shadow is within normal limits. The lungs are well aerated bilaterally. Mild interstitial changes are seen without focal confluent infiltrate. Degenerative changes of thoracic spine are noted. No other focal abnormality is seen. IMPRESSION: Mild interstitial changes likely of a chronic nature. Electronically Signed   By: Inez Catalina M.D.   On: 02/22/2016 18:44   Procedures Procedures (including critical care time)  Medications Ordered in ED Medications - No data to display   Initial Impression / Assessment and Plan / ED Course  I have reviewed the triage vital signs and the nursing notes.  Pertinent labs &  imaging results that were available during my care of the patient were reviewed by me and considered in my medical decision making (see chart for details).  Clinical Course    Patient presented to the emergency department for evaluation of shortness of breath that occurred after walking on a treadmill. Symptoms occurred approximately 4 hours prior to triage and approximately 9 hours prior to my seeing the patient. He reports that he put the treadmill on a much steeper incline at a much faster rate than he ever uses and then became short of breath. Heart rate went up to the 120s and he became very anxious. Heart rate quickly did come back down into the normal range. He never developed chest pain. He does have a history of coronary artery disease, status post 2 stents 19 years ago. He does report that he follows up with Dr. Daneen Schick regularly and had a stress test a couple of years ago that was normal. His troponin that was drawn proximally 5 hours after the event was negative. EKG unchanged from previous. He has not been significant all here in the ER for the last 5 hours. It is felt that the patient is low risk for cardiac etiology, likely overexerting himself on a treadmill and then had a small panic attack. It was recommended that he call Dr. Tamala Oliver in the morning to schedule follow-up next week for recheck and possible formal stress test but does not warrant admission at this time.  Final Clinical Impressions(s) / ED Diagnoses   Final diagnoses:  Anxiety  Shortness of breath    New Prescriptions Discharge Medication List as of 02/22/2016 11:20 PM     I personally performed the services described in this documentation, which was scribed in my presence. The recorded information has been reviewed and is accurate.     Brandon Greek, MD 02/23/16 [id number]

## 2016-03-07 ENCOUNTER — Ambulatory Visit: Payer: Self-pay | Admitting: Nurse Practitioner

## 2016-03-11 ENCOUNTER — Encounter: Payer: Self-pay | Admitting: Interventional Cardiology

## 2016-03-11 ENCOUNTER — Ambulatory Visit (INDEPENDENT_AMBULATORY_CARE_PROVIDER_SITE_OTHER): Payer: Medicare Other | Admitting: Interventional Cardiology

## 2016-03-11 VITALS — BP 106/78 | HR 78 | Ht 69.0 in | Wt 208.8 lb

## 2016-03-11 DIAGNOSIS — F250 Schizoaffective disorder, bipolar type: Secondary | ICD-10-CM | POA: Diagnosis not present

## 2016-03-11 DIAGNOSIS — R0609 Other forms of dyspnea: Secondary | ICD-10-CM

## 2016-03-11 DIAGNOSIS — E785 Hyperlipidemia, unspecified: Secondary | ICD-10-CM | POA: Diagnosis not present

## 2016-03-11 DIAGNOSIS — I2510 Atherosclerotic heart disease of native coronary artery without angina pectoris: Secondary | ICD-10-CM

## 2016-03-11 NOTE — Patient Instructions (Signed)
Medication Instructions:  Your physician recommends that you continue on your current medications as directed. Please refer to the Current Medication list given to you today.   Labwork: None ordered  Testing/Procedures: Your physician has requested that you have en exercise stress myoview. For further information please visit [email or web address]. Please follow instruction sheet, as given.   Follow-Up: Your physician wants you to follow-up in: 1 year with Dr.Smith You will receive a reminder letter in the mail two months in advance. If you don't receive a letter, please call our office to schedule the follow-up appointment.   Any Other Special Instructions Will Be Listed Below (If Applicable).     If you need a refill on your cardiac medications before your next appointment, please call your pharmacy.

## 2016-03-11 NOTE — Progress Notes (Signed)
Cardiology Office Note    Date:  03/11/2016   ID:  Brandon Oliver, DOB [birth year], MRN [id number]  PCP:   Brandon Lope, MD  Cardiologist: Brandon Noe, MD   Chief Complaint  Patient presents with  . Coronary Artery Disease  . Shortness of Breath    History of Present Illness:  Brandon Oliver is a 70 y.o. male for follow-up of CAD, history of bare metal stent circumflex 1998, hyperlipidemia, and schizoaffective disorder.  For whatever reason, during exercise earlier this summer he walked really hard at a steep incline for greater than a minute and developed severe shortness of breath that was difficult to reverse. Even after his breathing improved he felt at bedtime. He denies any chest discomfort. He eventually went to the emergency room where chest x-ray, EKG, and marker was unremarkable. He has designated this ss anxiety. However, he has stopped exercising vigorously and is frightened that this sensation may return.  Past Medical History:  Diagnosis Date  . Cataracts, both eyes    none mature  . History of kidney stones    x3 episodes  . Hyperlipidemia   . Myocardial infarction [facility])    coronary stents x2 at that time.  Marland Kitchen Retina disorder    "floaters" ? tear- Dr. Ashley Oliver follows  . Schizoaffective disorder, bipolar type ([facility])    sees MD every 3- 6 months- controlled.    Past Surgical History:  Procedure Laterality Date  . CARDIAC CATHETERIZATION     stents x2- Dr. Mendel Ryder follows  . COLONOSCOPY WITH PROPOFOL N/A 02/07/2015   Procedure: COLONOSCOPY WITH PROPOFOL;  Surgeon: Brandon Bumpers, MD;  Location: [facility];  Service: Endoscopy;  Laterality: N/A;  . HEMORROIDECTOMY      Current Medications: Outpatient Medications Prior to Visit  Medication Sig Dispense Refill  . aspirin 81 MG tablet Take 81 mg by mouth daily.    . carvedilol (COREG) 3.125 MG tablet Take 3.125 mg by mouth 2 (two) times daily with a meal.     . Coenzyme Q10 (COQ-10) 50 MG CAPS  Take 50 mg by mouth daily.    . Multiple Vitamin (MULTIVITAMIN) tablet Take 1 tablet by mouth daily.    . Omega-3 Fatty Acids (FISH OIL) 1000 MG CAPS Take 1,000 mg by mouth daily.     . risperiDONE (RISPERDAL) 2 MG tablet TAKE 1 TABLET (2 MG TOTAL) BY MOUTH AT BEDTIME. 90 tablet 0  . sertraline (ZOLOFT) 100 MG tablet Take 1 tablet (100 mg total) by mouth daily. 90 tablet 0  . simvastatin (ZOCOR) 40 MG tablet Take 40 mg by mouth daily.      No facility-administered medications prior to visit.      Allergies:   Penicillins and Benadryl [diphenhydramine hcl]   Social History   Social History  . Marital status: Single    Spouse name: N/A  . Number of children: N/A  . Years of education: N/A   Social History Main Topics  . Smoking status: Never Smoker  . Smokeless tobacco: Never Used  . Alcohol use No  . Drug use: No  . Sexual activity: Not Asked   Other Topics Concern  . None   Social History Narrative  . None     Family History:  The patient's family history includes Cancer in his father, maternal grandmother, mother, paternal aunt, paternal grandfather, paternal uncle, and paternal uncle; Diabetes in his maternal aunt.   ROS:   Please see the history  of present illness.    Anxiety. We reviewed all data from the recent ER visit.  All other systems reviewed and are negative.   PHYSICAL EXAM:   VS:  BP 106/78   Pulse 78   Ht 5\' 9"  (1.753 m)   Wt 208 lb 12.8 oz (94.7 kg)   BMI 30.83 kg/m    GEN: Well nourished, well developed, in no acute distress  HEENT: normal  Neck: no JVD, carotid bruits, or masses Cardiac: RRR; no murmurs, rubs, or gallops,no edema  Respiratory:  clear to auscultation bilaterally, normal work of breathing GI: soft, nontender, nondistended, + BS MS: no deformity or atrophy  Skin: warm and dry, no rash Neuro:  Alert and Oriented x 3, Strength and sensation are intact Psych: euthymic mood, full affect  Wt Readings from Last 3 Encounters:    03/11/16 208 lb 12.8 oz (94.7 kg)  08/30/15 203 lb 12.8 oz (92.4 kg)  02/07/15 202 lb (91.6 kg)      Studies/Labs Reviewed:   EKG:  EKG  Not repeated from 02/22/16 reveals small inferior Q waves but otherwise no acute abnormality.  Recent Labs: 02/22/2016: BUN 14; Creatinine, Ser 0.84; Hemoglobin 14.0; Platelets 223; Potassium 4.2; Sodium 136   Lipid Panel No results found for: CHOL, TRIG, HDL, CHOLHDL, VLDL, LDLCALC, LDLDIRECT  Additional studies/ records that were reviewed today include:  Chest x-ray and other data were reviewed.  Negative exercise treadmill test for ischemia in March 2016.    ASSESSMENT:    1. Exertional dyspnea   2. Atherosclerosis of native coronary artery of native heart without angina pectoris   3. Hyperlipidemia   4. Schizoaffective disorder, bipolar type ([facility])      PLAN:  In order of problems listed above:  1. This occurred with heavy activity on treadmill at very steep incline and was concerning enough and lasted long enough after exercise that he went to the emergency room for workup was negative. He has 2 bare-metal stents that were placed nearly 20 years ago. Exercise treadmill test one year ago was unremarkable. We will plan an exercise Myoview at peak exercise. Beta blocker therapy needs to be held. 2. Rule out progression of disease with stress nuclear testing 3. Continue aggressive lipid-lowering with LDL target 70 mg/dL    Medication Adjustments/Labs and Tests Ordered: Current medicines are reviewed at length with the patient today.  Concerns regarding medicines are outlined above.  Medication changes, Labs and Tests ordered today are listed in the Patient Instructions below. Patient Instructions  Medication Instructions:  Your physician recommends that you continue on your current medications as directed. Please refer to the Current Medication list given to you today.   Labwork: None ordered  Testing/Procedures: Your physician has  requested that you have en exercise stress myoview. For further information please visit [email or web address] Please follow instruction sheet, as given.   Follow-Up: Your physician wants you to follow-up in: 1 year with Dr.Cashel Bellina You will receive a reminder letter in the mail two months in advance. If you don't receive a letter, please call our office to schedule the follow-up appointment.   Any Other Special Instructions Will Be Listed Below (If Applicable).     If you need a refill on your cardiac medications before your next appointment, please call your pharmacy.      Signed, Brandon Noe, MD  03/11/2016 10:59 AM    [facility] [address] [place], [place], [place]  [address] Phone: [phone];  Fax: [phone]

## 2016-03-25 ENCOUNTER — Telehealth (HOSPITAL_COMMUNITY): Payer: Self-pay | Admitting: *Deleted

## 2016-03-25 NOTE — Telephone Encounter (Signed)
Patient given detailed instructions per Myocardial Perfusion Study Information Sheet for the test on 03/27/16 Patient notified to arrive 15 minutes early and that it is imperative to arrive on time for appointment to keep from having the test rescheduled.  If you need to cancel or reschedule your appointment, please call the office within 24 hours of your appointment. Failure to do so may result in a cancellation of your appointment, and a $50 no show fee. Patient verbalized understanding. Hubbard Robinson, RN

## 2016-03-26 ENCOUNTER — Encounter (HOSPITAL_COMMUNITY): Payer: Self-pay

## 2016-03-27 ENCOUNTER — Ambulatory Visit (HOSPITAL_COMMUNITY): Payer: Medicare Other | Attending: Cardiovascular Disease

## 2016-03-27 DIAGNOSIS — R0609 Other forms of dyspnea: Secondary | ICD-10-CM | POA: Diagnosis not present

## 2016-03-27 DIAGNOSIS — I2510 Atherosclerotic heart disease of native coronary artery without angina pectoris: Secondary | ICD-10-CM

## 2016-03-27 DIAGNOSIS — R9439 Abnormal result of other cardiovascular function study: Secondary | ICD-10-CM | POA: Insufficient documentation

## 2016-03-27 LAB — MYOCARDIAL PERFUSION IMAGING
CHL CUP MPHR: 150 {beats}/min
CHL CUP NUCLEAR SDS: 3
CHL CUP NUCLEAR SSS: 13
CHL CUP RESTING HR STRESS: 77 {beats}/min
CSEPED: 5 min
CSEPEW: 7 METS
Exercise duration (sec): 30 s
LHR: 0.28
LV sys vol: 34 mL
LVDIAVOL: 85 mL (ref 62–150)
Peak HR: 155 {beats}/min
Percent HR: 103 %
SRS: 10
TID: 0.82

## 2016-03-27 IMAGING — NM NM MISC PROCEDURE
9 series · 54 of 54 positions shown · non-contrast
Comparison: none

[Series 1: rest_(id)_sa · 6.4mm · 6.40mm/px · 6 of 64 frames shown]
[frame 6/64]
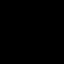
[frame 16/64]
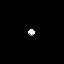
[frame 27/64]
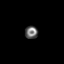
[frame 38/64]
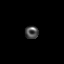
[frame 48/64]
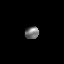
[frame 59/64]
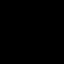

[Series 1: wbr_s-proj_st stress-sum-em · 6.40mm/px · 6 of 64 frames shown]
[frame 6/64]
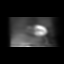
[frame 16/64]
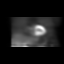
[frame 27/64]
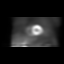
[frame 38/64]
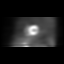
[frame 48/64]
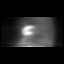
[frame 59/64]
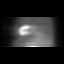

[Series 1: stress-gsp_(id)_sa · 6.4mm · 6.40mm/px · 6 of 512 frames shown]
[frame 43/512]
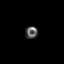
[frame 128/512]
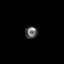
[frame 214/512]
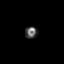
[frame 299/512]
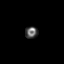
[frame 384/512]
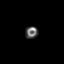
[frame 470/512]
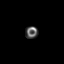

[Series 1: wbr_r-proj_st rest · 6.40mm/px · 6 of 64 frames shown]
[frame 6/64]
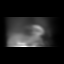
[frame 16/64]
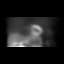
[frame 27/64]
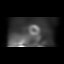
[frame 38/64]
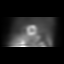
[frame 48/64]
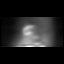
[frame 59/64]
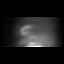

[Series 1: stress-sum-em · 6.40mm/px · 6 of 64 frames shown]
[frame 6/64]
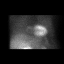
[frame 16/64]
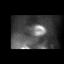
[frame 27/64]
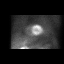
[frame 38/64]
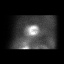
[frame 48/64]
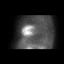
[frame 59/64]
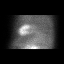

[Series 1: wbr_s-proj_st stress-gsp · 6.40mm/px · 6 of 512 frames shown]
[frame 43/512]
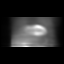
[frame 128/512]
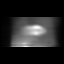
[frame 214/512]
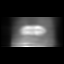
[frame 299/512]
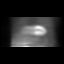
[frame 384/512]
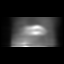
[frame 470/512]
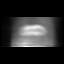

[Series 1: stress-gsp · 6.40mm/px · 6 of 511 frames shown]
[frame 43/511  full-range]
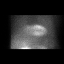
[frame 128/511  full-range]
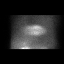
[frame 213/511  full-range]
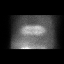
[frame 298/511  full-range]
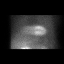
[frame 383/511  full-range]
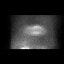
[frame 469/511  full-range]
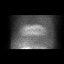

[Series 1: rest · 6.40mm/px · 6 of 64 frames shown]
[frame 6/64]
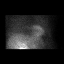
[frame 16/64]
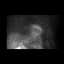
[frame 27/64]
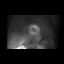
[frame 38/64]
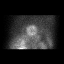
[frame 48/64]
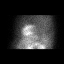
[frame 59/64]
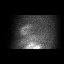

[Series 1: stress-sum-em_(id)_sa · 6.4mm · 6.40mm/px · 6 of 64 frames shown]
[frame 6/64]
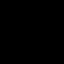
[frame 16/64]
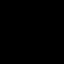
[frame 27/64]
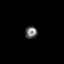
[frame 38/64]
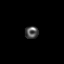
[frame 48/64]
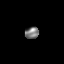
[frame 59/64]
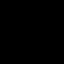

[54 of 54 positions shown; findings below may reference images not displayed]

Canned report from images found in remote index.

Refer to host system for actual result text.

## 2016-03-27 MED ORDER — TECHNETIUM TC 99M TETROFOSMIN IV KIT
32.6000 | PACK | Freq: Once | INTRAVENOUS | Status: AC | PRN
  Administered 2016-03-27: 33 via INTRAVENOUS
  Filled 2016-03-27: qty 33

## 2016-03-27 MED ORDER — TECHNETIUM TC 99M TETROFOSMIN IV KIT
10.6000 | PACK | Freq: Once | INTRAVENOUS | Status: AC | PRN
  Administered 2016-03-27: 11 via INTRAVENOUS
  Filled 2016-03-27: qty 11

## 2016-03-29 ENCOUNTER — Telehealth: Payer: Self-pay | Admitting: Interventional Cardiology

## 2016-03-29 NOTE — Telephone Encounter (Signed)
Spoke with pt. He confirmed his dob, adv  Him that his picture is also in his chart. Reassured pt that he was given the correct results for his myoview. Reviewed esults again with pt. Pt voiced appreciation for the call back.

## 2016-03-29 NOTE — Telephone Encounter (Signed)
New Message  Pt voiced he is calling back with questions about his test to make sure his results were given to him and not anyone else's results.  Pt voiced the concern we mixed pts names up with another pts name during previous appt.  Pt voiced he wants to make sure his results were provided to him and not anyone else's due to name mix up during previous appt.  Please follow up with pt. Thanks!

## 2016-04-24 ENCOUNTER — Other Ambulatory Visit (HOSPITAL_COMMUNITY): Payer: Self-pay | Admitting: Psychiatry

## 2016-04-24 DIAGNOSIS — F250 Schizoaffective disorder, bipolar type: Secondary | ICD-10-CM

## 2016-04-25 NOTE — Telephone Encounter (Signed)
Dr. Adele Schilder approved new 90 day orders for patient's prescribed Zoloft and Risperdal as patient is to return for his 6 month appointment next on 06/13/16.  New  90 day orders e-scribed to patient's [facility] [address] as approved.

## 2016-06-04 ENCOUNTER — Ambulatory Visit (INDEPENDENT_AMBULATORY_CARE_PROVIDER_SITE_OTHER): Payer: Medicare Other | Admitting: Podiatry

## 2016-06-04 ENCOUNTER — Encounter: Payer: Self-pay | Admitting: Podiatry

## 2016-06-04 VITALS — BP 147/95 | HR 88 | Resp 16

## 2016-06-04 DIAGNOSIS — M79676 Pain in unspecified toe(s): Secondary | ICD-10-CM | POA: Diagnosis not present

## 2016-06-04 DIAGNOSIS — B351 Tinea unguium: Secondary | ICD-10-CM

## 2016-06-04 NOTE — Progress Notes (Signed)
   Subjective:    Patient ID: Brandon Oliver, male    DOB: September 22, 1945, 70 y.o.   MRN: [id number]  HPIThis patient presents to the office stating his nails have grown thick and long.  He has not trimmed his nails in over a year.  He is not diabetic.  He presents for preventive foot care services.    Review of Systems  All other systems reviewed and are negative.      Objective:   Physical Exam GENERAL APPEARANCE: Alert, conversant. Appropriately groomed. No acute distress.  VASCULAR: Pedal pulses are  palpable at  [facility] and PT bilateral.  Capillary refill time is immediate to all digits,  Normal temperature gradient.  Digital hair growth is present bilateral  NEUROLOGIC: sensation is normal to 5.07 monofilament at 5/5 sites bilateral.  Light touch is intact bilateral, Muscle strength normal.  MUSCULOSKELETAL: acceptable muscle strength, tone and stability bilateral.  Intrinsic muscluature intact bilateral.  Rectus appearance of foot and digits noted bilateral.   DERMATOLOGIC: skin color, texture, and turgor are within normal limits.  No preulcerative lesions or ulcers  are seen, no interdigital maceration noted.  No open lesions present. . No drainage noted.  NAILS  Thick disfigured hallux  Toenails  Both feet.            Assessment & Plan:  Onychomycosis  Hallux  B/L   IE  Debridement of nails  B/L

## 2016-06-13 ENCOUNTER — Ambulatory Visit (HOSPITAL_COMMUNITY): Payer: Self-pay | Admitting: Psychiatry

## 2016-06-17 ENCOUNTER — Encounter (HOSPITAL_COMMUNITY): Payer: Self-pay | Admitting: Psychiatry

## 2016-06-17 ENCOUNTER — Ambulatory Visit (INDEPENDENT_AMBULATORY_CARE_PROVIDER_SITE_OTHER): Payer: 59 | Admitting: Psychiatry

## 2016-06-17 DIAGNOSIS — F250 Schizoaffective disorder, bipolar type: Secondary | ICD-10-CM

## 2016-06-17 MED ORDER — RISPERIDONE 2 MG PO TABS: 2.0000 mg | ORAL_TABLET | Freq: Every day | ORAL | 0 refills | Status: DC

## 2016-06-17 MED ORDER — SERTRALINE HCL 100 MG PO TABS: 100.0000 mg | ORAL_TABLET | Freq: Every day | ORAL | 0 refills | Status: DC

## 2016-06-17 NOTE — Progress Notes (Signed)
[place] Progress Note  OLGA NORSWORTHY [id number] 70 y.o.  06/17/2016 1:21 PM  Chief Complaint:  I am doing fine.  I want to continue my medicine.     History of Present Illness:  Patient came for his follow-up appointment.  He is taking his medication as prescribed.  In August he went to the emergency room because of shortness of breath however his workup was negative.  He recently seen his cardiologist and had a stress test and he did leave everything is okay.  Overall he described his mood is stable.  He denies any irritability, anger, mania or psychosis.  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  We discussed lowering his Risperdal but he does not want to change or reduce his medication because he believes it is working very well.  He sleeping good.  His energy level is good.  He is trying to lose weight and recently had lost a few pounds as he is more conscious about diet.  Patient denies any feeling of hopelessness or worthlessness.  He lives by himself.  He has no children.  Patient denies drinking alcohol or using any illegal substances.  Patient has upcoming appointment with his primary care physician Dr. [name] in January 2018.  Past Psychiatric History/Hospitalization(s) Patient has one psychiatric hospitalization in 2005 when he was admitted to behavioral [place] because of psychosis and paranoia. Anxiety: No Bipolar Disorder: No Depression: No Mania: No Psychosis: Yes Schizophrenia: No Personality Disorder: No Hospitalization for psychiatric illness: Yes History of Electroconvulsive Shock Therapy: No Prior Suicide Attempts: No  Medical History; Patient has hyperlipidemia and history of myocardial infarction.  His primary care physician is Dr. Harrington Challenger at [facility].  Review of Systems  Constitutional: Negative.  Negative for weight loss.  HENT: Negative.   Respiratory:  Negative.   Cardiovascular: Negative.   Musculoskeletal: Negative.   Skin: Negative.   Neurological: Negative.     Psychiatric: Agitation: No Hallucination: No Depressed Mood: No Insomnia: No Hypersomnia: No Altered Concentration: No Feels Worthless: No Grandiose Ideas: No Belief In Special Powers: No New/Increased Substance Abuse: No Compulsions: No  Neurologic: Headache: No Seizure: No Paresthesias: No      Medication List       Accurate as of 06/17/16  1:21 PM. Always use your most recent med list.          aspirin 81 MG tablet Take 81 mg by mouth daily.   carvedilol 3.125 MG tablet Commonly known as:  COREG Take 3.125 mg by mouth 2 (two) times daily with a meal.   CoQ-10 50 MG Caps Take 50 mg by mouth daily.   Fish Oil 1000 MG Caps Take 1,000 mg by mouth daily.   multivitamin tablet Take 1 tablet by mouth daily.   risperiDONE 2 MG tablet Commonly known as:  RISPERDAL Take 1 tablet (2 mg total) by mouth at bedtime.   sertraline 100 MG tablet Commonly known as:  ZOLOFT Take 1 tablet (100 mg total) by mouth daily.   simvastatin 40 MG tablet Commonly known as:  ZOCOR Take 40 mg by mouth daily.       Recent Results (from the past 2160 hour(s))  Myocardial Perfusion Imaging     Status: None   Collection Time: 03/27/16 12:11 PM  Result Value Ref Range   Rest HR 77 bpm   Rest BP 147/80 mmHg   Exercise duration (  sec) 30 sec   Percent HR 103 %   Exercise duration (min) 5 min   Estimated workload 7.0 METS   Peak HR 155 bpm   Peak BP 202/90 mmHg   MPHR 150 bpm   SSS 13    SRS 10    SDS 3    LHR 0.28    TID 0.82    LV sys vol 34 mL   LV dias vol 85 62 - 150 mL      Physical Exam: Constitutional:  BP 140/80   Pulse 78   Ht 5\' 9"  (1.753 m)   Wt 198 lb 9.6 oz (90.1 kg)   BMI 29.33 kg/m   Musculoskeletal: Strength & Muscle Tone: within normal limits Gait & Station: normal Patient leans: Patient has a normal posture  Mental  Status Examination;  Patient is pleasant and casually dressed.  He does not appear to be in distress.  He is calm and cooperative.  He maintained good eye contact.  His speech is slow but clear and coherent.  He described his mood good and his affect is appropriate.  There were no delusions, paranoia or any obsessive thoughts.  There were no flight of ideas or any loose association.  His thought processes logical and goal-directed.  His attention consultation is good.  There were no extrapyramidal side effects.  He denies any active or passive suicidal thoughts or homicidal thought.  He is alert and oriented 3.  His insight judgment and impulse control is okay.  Psychomotor activity is normal.  His fund of knowledge is adequate.   Review or order clinical lab tests (1), Review of Last Therapy Session (1) and Review of Medication Regimen & Side Effects (2)  Assessment: Axis I: Schizoaffective disorder, mild obesity  Axis II: Deferred  Axis III:  Past Medical History:  Diagnosis Date  . Cataracts, both eyes    none mature  . History of kidney stones    x3 episodes  . Hyperlipidemia   . Myocardial infarction    coronary stents x2 at that time.  Marland Kitchen Retina disorder    "floaters" ? tear- Dr. Zigmund Daniel follows  . Schizoaffective disorder, bipolar type ([place])    sees MD every 3- 6 months- controlled.    Plan:  Patient is stable on his current psychiatric medication.  He does not want to change his medication.  I review his blood work which was done in August .  I also offered him to lower Risperdal but he does not want to change or decrease the dosage of Risperdal.  At this time he has no side effects including any EPS or metabolic syndrome.  I will continue Risperdal 2 mg at bedtime and Zoloft 100 mg daily.  Recommended to call us back if he has any question or any concern.  Encouraged to continue to work on his weight loss.  Follow-up in 3 months.  Patient is scheduled to see his primary care  physician in generally 2018.  Milbert Bixler T., MD 06/17/2016

## 2016-07-01 ENCOUNTER — Ambulatory Visit (INDEPENDENT_AMBULATORY_CARE_PROVIDER_SITE_OTHER): Payer: Medicare Other | Admitting: Ophthalmology

## 2016-08-15 ENCOUNTER — Ambulatory Visit (INDEPENDENT_AMBULATORY_CARE_PROVIDER_SITE_OTHER): Payer: Medicare Other | Admitting: Ophthalmology

## 2016-09-17 ENCOUNTER — Ambulatory Visit (INDEPENDENT_AMBULATORY_CARE_PROVIDER_SITE_OTHER): Payer: 59 | Admitting: Psychiatry

## 2016-09-17 ENCOUNTER — Encounter (HOSPITAL_COMMUNITY): Payer: Self-pay | Admitting: Psychiatry

## 2016-09-17 DIAGNOSIS — F250 Schizoaffective disorder, bipolar type: Secondary | ICD-10-CM | POA: Diagnosis not present

## 2016-09-17 DIAGNOSIS — F29 Unspecified psychosis not due to a substance or known physiological condition: Secondary | ICD-10-CM

## 2016-09-17 DIAGNOSIS — Z800 Family history of malignant neoplasm of digestive organs: Secondary | ICD-10-CM | POA: Diagnosis not present

## 2016-09-17 DIAGNOSIS — Z880 Allergy status to penicillin: Secondary | ICD-10-CM

## 2016-09-17 DIAGNOSIS — Z801 Family history of malignant neoplasm of trachea, bronchus and lung: Secondary | ICD-10-CM

## 2016-09-17 DIAGNOSIS — Z833 Family history of diabetes mellitus: Secondary | ICD-10-CM

## 2016-09-17 DIAGNOSIS — Z9889 Other specified postprocedural states: Secondary | ICD-10-CM | POA: Diagnosis not present

## 2016-09-17 DIAGNOSIS — Z7982 Long term (current) use of aspirin: Secondary | ICD-10-CM

## 2016-09-17 DIAGNOSIS — Z888 Allergy status to other drugs, medicaments and biological substances status: Secondary | ICD-10-CM

## 2016-09-17 DIAGNOSIS — Z803 Family history of malignant neoplasm of breast: Secondary | ICD-10-CM

## 2016-09-17 DIAGNOSIS — Z79899 Other long term (current) drug therapy: Secondary | ICD-10-CM

## 2016-09-17 MED ORDER — SERTRALINE HCL 100 MG PO TABS: 100.0000 mg | ORAL_TABLET | Freq: Every day | ORAL | 1 refills | Status: DC

## 2016-09-17 MED ORDER — RISPERIDONE 2 MG PO TABS: 2.0000 mg | ORAL_TABLET | Freq: Every day | ORAL | 1 refills | Status: DC

## 2016-09-17 NOTE — Progress Notes (Signed)
BH MD/PA/NP OP Progress Note  09/17/2016 11:44 AM Brandon Oliver  MRN:  [id number]  Chief Complaint:  Chief Complaint    Follow-up     Subjective:  I'm doing good on my medication.  I'm sleeping good.  HPI: Patient came for his follow-up appointment.  He had a good Christmas.  He is taking his medication and denies any side effects.  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  He really enjoy his volunteer work.  He does not want to change his medication because he feels it is working very well for him.  He denies any paranoia, hallucination, irritability, anger, mania or any psychosis.  We discussed lowering his Risperdal but he does not want to change the dose.  He has no tremors or shakes.  He is cautious about his diet and he started walking and watching his calorie intake.  He lost a few pounds and he feels proud of it.  He had blood work in January at his primary care physician and he was told his triglycerides is 472 and cholesterol 207.  He is hoping to lower these numbers by watching his diet and exercise.  His energy level is good.  He denies drinking alcohol or using any illegal substances.  Visit Diagnosis:    ICD-9-CM ICD-10-CM   1. Schizoaffective disorder, bipolar type ([place]) 295.70 F25.0 sertraline (ZOLOFT) 100 MG tablet     risperiDONE (RISPERDAL) 2 MG tablet    Past Psychiatric History: Reviewed. Patient has one psychiatric hospitalization in 2005 when he was admitted to behavioral [place] because of psychosis and paranoia.  Past Medical History:  Past Medical History:  Diagnosis Date  . Cataracts, both eyes    none mature  . History of kidney stones    x3 episodes  . Hyperlipidemia   . Myocardial infarction    coronary stents x2 at that time.  Marland Kitchen Retina disorder    "floaters" ? tear- Dr. Zigmund Daniel follows  . Schizoaffective disorder, bipolar type ([place])    sees MD every 3- 6 months- controlled.    Past  Surgical History:  Procedure Laterality Date  . CARDIAC CATHETERIZATION     stents x2- Dr. Linard Millers follows  . COLONOSCOPY WITH PROPOFOL N/A 02/07/2015   Procedure: COLONOSCOPY WITH PROPOFOL;  Surgeon: Garlan Fair, MD;  Location: [facility];  Service: Endoscopy;  Laterality: N/A;  . HEMORROIDECTOMY      Family Psychiatric History: Reviewed.  Family History:  Family History  Problem Relation Age of Onset  . Cancer Mother     breast  . Cancer Father     colon  . Diabetes Maternal Aunt   . Cancer Paternal Aunt     "male" cancer  . Cancer Paternal Uncle     pancreatic  . Cancer Paternal Grandfather     colon  . Cancer Paternal Uncle     lung  . Cancer Maternal Grandmother     pancreatic    Social History:  Social History   Social History  . Marital status: Single    Spouse name: N/A  . Number of children: N/A  . Years of education: N/A   Social History Main Topics  . Smoking status: Never Smoker  . Smokeless tobacco: Never Used  . Alcohol use No  . Drug use: No  . Sexual activity: Not Currently   Other Topics Concern  . None   Social History Narrative  . None  Allergies:  Allergies  Allergen Reactions  . Penicillins Other (See Comments)    "unsure childhood allergen"  . Benadryl [Diphenhydramine Hcl] Rash    Metabolic Disorder Labs: No results found for: HGBA1C, MPG No results found for: PROLACTIN No results found for: CHOL, TRIG, HDL, CHOLHDL, VLDL, LDLCALC   Current Medications: Current Outpatient Prescriptions  Medication Sig Dispense Refill  . aspirin 81 MG tablet Take 81 mg by mouth daily.    . carvedilol (COREG) 3.125 MG tablet Take 3.125 mg by mouth 2 (two) times daily with a meal.     . Coenzyme Q10 (COQ-10) 50 MG CAPS Take 50 mg by mouth daily.    . Multiple Vitamin (MULTIVITAMIN) tablet Take 1 tablet by mouth daily.    . Omega-3 Fatty Acids (FISH OIL) 1000 MG CAPS Take 1,000 mg by mouth daily.     . risperiDONE (RISPERDAL) 2  MG tablet Take 1 tablet (2 mg total) by mouth at bedtime. 90 tablet 0  . sertraline (ZOLOFT) 100 MG tablet Take 1 tablet (100 mg total) by mouth daily. 90 tablet 0  . simvastatin (ZOCOR) 40 MG tablet Take 40 mg by mouth daily.      No current facility-administered medications for this visit.     Neurologic: Headache: No Seizure: No Paresthesias: No  Musculoskeletal: Strength & Muscle Tone: within normal limits Gait & Station: normal Patient leans: N/A  Psychiatric Specialty Exam: Review of Systems  Skin: Negative.   Neurological: Negative.     Blood pressure 138/78, pulse 90, height 5' 8.75" (1.746 m), weight 194 lb 3.2 oz (88.1 kg).Body mass index is 28.89 kg/m.  General Appearance: Casual  Eye Contact:  Good  Speech:  Clear and Coherent  Volume:  Normal  Mood:  Euthymic  Affect:  Appropriate and Congruent  Thought Process:  Goal Directed  Orientation:  Full (Time, Place, and Person)  Thought Content: WDL and Logical   Suicidal Thoughts:  No  Homicidal Thoughts:  No  Memory:  Immediate;   Good Recent;   Good Remote;   Good  Judgement:  Good  Insight:  Good  Psychomotor Activity:  Normal  Concentration:  Concentration: Good and Attention Span: Good  Recall:  Good  Fund of Knowledge: Good  Language: Good  Akathisia:  No  Handed:  Right  AIMS (if indicated):  0  Assets:  Communication Skills Desire for Improvement Housing Resilience Social Support  ADL's:  Intact  Cognition: WNL  Sleep:  good    Assessment: Schizophrenia chronic paranoid type  Plan: Patient is doing better on Risperdal and Zoloft.  He has no tremors, shakes, EPS or any other concerns.  I will continue Risperdal 2 mg at bedtime and Zoloft 100 mg daily.  Follow-up in 6 months. Discussed medication side effects and benefits.  Recommended to call us back if there is any question, concern or worsening of the symptoms.  Discuss safety plan that anytime having active suicidal thoughts or homicidal  thoughts and she need to call 911 or go to the local emergency room.  ARFEEN,SYED T., MD 09/17/2016, 11:44 AM

## 2016-09-27 ENCOUNTER — Ambulatory Visit (INDEPENDENT_AMBULATORY_CARE_PROVIDER_SITE_OTHER): Payer: Medicare Other | Admitting: Ophthalmology

## 2016-09-27 DIAGNOSIS — H43813 Vitreous degeneration, bilateral: Secondary | ICD-10-CM | POA: Diagnosis not present

## 2016-09-27 DIAGNOSIS — D3132 Benign neoplasm of left choroid: Secondary | ICD-10-CM | POA: Diagnosis not present

## 2016-09-27 DIAGNOSIS — H33301 Unspecified retinal break, right eye: Secondary | ICD-10-CM | POA: Diagnosis not present

## 2016-10-01 ENCOUNTER — Ambulatory Visit (INDEPENDENT_AMBULATORY_CARE_PROVIDER_SITE_OTHER): Payer: Medicare Other | Admitting: Podiatry

## 2016-10-01 ENCOUNTER — Encounter: Payer: Self-pay | Admitting: Podiatry

## 2016-10-01 DIAGNOSIS — M79676 Pain in unspecified toe(s): Secondary | ICD-10-CM | POA: Diagnosis not present

## 2016-10-01 DIAGNOSIS — B351 Tinea unguium: Secondary | ICD-10-CM | POA: Diagnosis not present

## 2016-10-01 NOTE — Progress Notes (Signed)
   Subjective:    Patient ID: Brandon Oliver, male    DOB: November 19, 1945, 71 y.o.   MRN: [id number]  HPIThis patient presents to the office stating his nails have grown thick and long.  He has not trimmed his nails in over a year.  He is not diabetic.  He presents for preventive foot care services.    Review of Systems  All other systems reviewed and are negative.      Objective:   Physical Exam GENERAL APPEARANCE: Alert, conversant. Appropriately groomed. No acute distress.  VASCULAR: Pedal pulses are  palpable at  [facility] and PT bilateral.  Capillary refill time is immediate to all digits,  Normal temperature gradient.  Digital hair growth is present bilateral  NEUROLOGIC: sensation is normal to 5.07 monofilament at 5/5 sites bilateral.  Light touch is intact bilateral, Muscle strength normal.  MUSCULOSKELETAL: acceptable muscle strength, tone and stability bilateral.  Intrinsic muscluature intact bilateral.  Rectus appearance of foot and digits noted bilateral.   DERMATOLOGIC: skin color, texture, and turgor are within normal limits.  No preulcerative lesions or ulcers  are seen, no interdigital maceration noted.  No open lesions present. . No drainage noted.  NAILS  Thick disfigured hallux  Toenails  Both feet.            Assessment & Plan:  Onychomycosis  Hallux  B/L   IE  Debridement of nails  B/L

## 2017-02-04 ENCOUNTER — Encounter: Payer: Self-pay | Admitting: Podiatry

## 2017-02-04 ENCOUNTER — Ambulatory Visit (INDEPENDENT_AMBULATORY_CARE_PROVIDER_SITE_OTHER): Payer: Medicare Other | Admitting: Podiatry

## 2017-02-04 DIAGNOSIS — M79676 Pain in unspecified toe(s): Secondary | ICD-10-CM

## 2017-02-04 DIAGNOSIS — B351 Tinea unguium: Secondary | ICD-10-CM | POA: Diagnosis not present

## 2017-02-04 NOTE — Progress Notes (Signed)
   Subjective:    Patient ID: Brandon Oliver, male    DOB: 06-Mar-1946, 71 y.o.   MRN: [id number]  HPIThis patient presents to the office stating his nails have grown thick and long.  He has not trimmed his nails in over a year.  He is not diabetic.  He presents for preventive foot care services.    Review of Systems  All other systems reviewed and are negative.      Objective:   Physical Exam GENERAL APPEARANCE: Alert, conversant. Appropriately groomed. No acute distress.  VASCULAR: Pedal pulses are  palpable at  [facility] and PT bilateral.  Capillary refill time is immediate to all digits,  Normal temperature gradient.  Digital hair growth is present bilateral  NEUROLOGIC: sensation is normal to 5.07 monofilament at 5/5 sites bilateral.  Light touch is intact bilateral, Muscle strength normal.  MUSCULOSKELETAL: acceptable muscle strength, tone and stability bilateral.  Intrinsic muscluature intact bilateral.  Rectus appearance of foot and digits noted bilateral.   DERMATOLOGIC: skin color, texture, and turgor are within normal limits.  No preulcerative lesions or ulcers  are seen, no interdigital maceration noted.  No open lesions present. . No drainage noted.  NAILS  Thick disfigured hallux  Toenails  Both feet.            Assessment & Plan:  Onychomycosis  Hallux  B/L   IE  Debridement of nails  B/L

## 2017-03-10 ENCOUNTER — Encounter (HOSPITAL_COMMUNITY): Payer: Self-pay | Admitting: Psychiatry

## 2017-03-10 ENCOUNTER — Ambulatory Visit (INDEPENDENT_AMBULATORY_CARE_PROVIDER_SITE_OTHER): Payer: 59 | Admitting: Psychiatry

## 2017-03-10 DIAGNOSIS — F250 Schizoaffective disorder, bipolar type: Secondary | ICD-10-CM

## 2017-03-10 MED ORDER — SERTRALINE HCL 100 MG PO TABS: 100.0000 mg | ORAL_TABLET | Freq: Every day | ORAL | 1 refills | Status: DC

## 2017-03-10 MED ORDER — RISPERIDONE 2 MG PO TABS: 2.0000 mg | ORAL_TABLET | Freq: Every day | ORAL | 1 refills | Status: DC

## 2017-03-10 NOTE — Progress Notes (Signed)
BH MD/PA/NP OP Progress Note  03/10/2017 11:10 AM Brandon Oliver  MRN:  [id number]  Chief Complaint:  Subjective:  I'm doing good.  I visit my sister and [place] last month.  HPI: Brandon Oliver came for his follow-up appointment.  He is taking his medication and reported no side effects.  He visited [place] to visit his family member and he had a good time.  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  He does not want to change her lower his Risperdal because he believed he seems to be working very well and does not want to disrupt anything.  He sleeping good.  His thinking is clear.  He is active social and trying to lose weight.  He is scheduled to see a cardiologist and of this month for checkup.  Patient denies any paranoia, hallucination, suicidal thoughts or homicidal thought.  He denies any crying spells or any feeling of hopelessness or worthlessness.  His energy level is good.  His vital signs are stable.  Visit Diagnosis:    ICD-10-CM   1. Schizoaffective disorder, bipolar type ([place]) F25.0 sertraline (ZOLOFT) 100 MG tablet    risperiDONE (RISPERDAL) 2 MG tablet    Past Psychiatric History: Reviewed. Patient has one psychiatric hospitalization in 2005 when he was admitted due to psychosis and paranoia at behavioral [place].    Past Medical History:  Past Medical History:  Diagnosis Date  . Cataracts, both eyes    none mature  . History of kidney stones    x3 episodes  . Hyperlipidemia   . Myocardial infarction [facility])    coronary stents x2 at that time.  Marland Kitchen Retina disorder    "floaters" ? tear- Dr. Zigmund Daniel follows  . Schizoaffective disorder, bipolar type ([place])    sees MD every 3- 6 months- controlled.    Past Surgical History:  Procedure Laterality Date  . CARDIAC CATHETERIZATION     stents x2- Dr. Linard Millers follows  . COLONOSCOPY WITH PROPOFOL N/A 02/07/2015   Procedure: COLONOSCOPY WITH PROPOFOL;  Surgeon: Garlan Fair, MD;  Location: [facility];  Service: Endoscopy;  Laterality: N/A;  . HEMORROIDECTOMY      Family Psychiatric History: Reviewed.  Family History:  Family History  Problem Relation Age of Onset  . Cancer Mother        breast  . Cancer Father        colon  . Diabetes Maternal Aunt   . Cancer Paternal Aunt        "male" cancer  . Cancer Paternal Uncle        pancreatic  . Cancer Paternal Grandfather        colon  . Cancer Paternal Uncle        lung  . Cancer Maternal Grandmother        pancreatic    Social History:  Social History   Social History  . Marital status: Single    Spouse name: N/A  . Number of children: N/A  . Years of education: N/A   Social History Main Topics  . Smoking status: Never Smoker  . Smokeless tobacco: Never Used  . Alcohol use No  . Drug use: No  . Sexual activity: Not Currently   Other Topics Concern  . Not on file   Social History Narrative  . No narrative on file    Allergies:  Allergies  Allergen Reactions  . Penicillins Other (See Comments)    "unsure childhood allergen"  . Benadryl [Diphenhydramine Hcl] Rash    Metabolic Disorder Labs: No results found for: HGBA1C, MPG No results found for: PROLACTIN No results found for: CHOL, TRIG, HDL, CHOLHDL, VLDL, LDLCALC   Current Medications: Current Outpatient Prescriptions  Medication Sig Dispense Refill  . aspirin 81 MG tablet Take 81 mg by mouth daily.    . carvedilol (COREG) 3.125 MG tablet Take 3.125 mg by mouth 2 (two) times daily with a meal.     . Coenzyme Q10 (COQ-10) 50 MG CAPS Take 50 mg by mouth daily.    . Multiple Vitamin (MULTIVITAMIN) tablet Take 1 tablet by mouth daily.    . Omega-3 Fatty Acids (FISH OIL) 1000 MG CAPS Take 1,000 mg by mouth daily.     . risperiDONE (RISPERDAL) 2 MG tablet Take 1 tablet (2 mg total) by mouth at bedtime. 90 tablet 1  . sertraline (ZOLOFT) 100 MG tablet Take 1  tablet (100 mg total) by mouth daily. 90 tablet 1  . simvastatin (ZOCOR) 40 MG tablet Take 40 mg by mouth daily.      No current facility-administered medications for this visit.     Neurologic: Headache: No Seizure: No Paresthesias: No  Musculoskeletal: Strength & Muscle Tone: within normal limits Gait & Station: normal Patient leans: Right  Psychiatric Specialty Exam: ROS  Blood pressure 128/76, pulse 74, height 5' 8.75" (1.746 m), weight 188 lb 9.6 oz (85.5 kg).There is no height or weight on file to calculate BMI.  General Appearance: Casual  Eye Contact:  Good  Speech:  Clear and Coherent  Volume:  Normal  Mood:  Euthymic  Affect:  Appropriate  Thought Process:  Goal Directed  Orientation:  Full (Time, Place, and Person)  Thought Content: Logical   Suicidal Thoughts:  No  Homicidal Thoughts:  No  Memory:  Immediate;   Good Recent;   Good Remote;   Good  Judgement:  Good  Insight:  Good  Psychomotor Activity:  Normal  Concentration:  Concentration: Good and Attention Span: Good  Recall:  Good  Fund of Knowledge: Good  Language: Good  Akathisia:  No  Handed:  Right  AIMS (if indicated):  0  Assets:  Communication Skills Desire for Improvement Housing Resilience Social Support  ADL's:  Intact  Cognition: WNL  Sleep:  good    Assessment: Skills the affect of disorder, bipolar type.  Plan: Patient is a stable on his current psychiatric medication.  He does not want to change or reduce the dose.  I will continue Risperdal 2 mg at bedtime and Zoloft 100 mg daily.  Discussed medication side effects and benefits.  Recommended to call us back if he has any question, concern or if he feel worsening of the symptom.  Patient is not interested in counseling.  Follow-up in 6 months.  Shaqueena Mauceri T., MD 03/10/2017, 11:10 AM

## 2017-03-16 NOTE — Progress Notes (Signed)
Cardiology Office Note    Date:  03/18/2017   ID:  Brandon Oliver, Brandon Oliver 09-11-1945, MRN [id number]  PCP:  [name], MD  Cardiologist: [name], MD   Chief Complaint  Patient presents with  . Coronary Artery Disease    History of Present Illness:  Brandon Oliver is a 71 y.o. male for follow-up of CAD, history of bare metal stent circumflex 1998, hyperlipidemia, and schizoaffective disorder.  He is doing well. He exercises regularly. He has mild dizziness with standing. He denies orthopnea. No prolonged chest pain. No medication side effects. He is concerned his blood pressure may be too high. Blood pressure measured at primary care was 124/81 week ago.  Past Medical History:  Diagnosis Date  . Cataracts, both eyes    none mature  . History of kidney stones    x3 episodes  . Hyperlipidemia   . Myocardial infarction [facility])    coronary stents x2 at that time.  Marland Kitchen Retina disorder    "floaters" ? tear- Dr. Zigmund Daniel follows  . Schizoaffective disorder, bipolar type ([place])    sees MD every 3- 6 months- controlled.    Past Surgical History:  Procedure Laterality Date  . CARDIAC CATHETERIZATION     stents x2- Dr. Linard Millers follows  . COLONOSCOPY WITH PROPOFOL N/A 02/07/2015   Procedure: COLONOSCOPY WITH PROPOFOL;  Surgeon: Garlan Fair, MD;  Location: [facility];  Service: Endoscopy;  Laterality: N/A;  . HEMORROIDECTOMY      Current Medications: Outpatient Medications Prior to Visit  Medication Sig Dispense Refill  . aspirin 81 MG tablet Take 81 mg by mouth daily.    . carvedilol (COREG) 3.125 MG tablet Take 3.125 mg by mouth 2 (two) times daily with a meal.     . Multiple Vitamin (MULTIVITAMIN) tablet Take 1 tablet by mouth daily.    . Omega-3 Fatty Acids (FISH OIL) 1000 MG CAPS Take 1,000 mg by mouth daily.     . risperiDONE (RISPERDAL) 2 MG tablet Take 1 tablet (2 mg total) by mouth at bedtime. 90 tablet 1  . sertraline (ZOLOFT) 100 MG tablet Take  1 tablet (100 mg total) by mouth daily. 90 tablet 1  . simvastatin (ZOCOR) 40 MG tablet Take 40 mg by mouth daily.     . Coenzyme Q10 (COQ-10) 50 MG CAPS Take 50 mg by mouth daily.     No facility-administered medications prior to visit.      Allergies:   Penicillins and Benadryl [diphenhydramine hcl]   Social History   Social History  . Marital status: Single    Spouse name: N/A  . Number of children: N/A  . Years of education: N/A   Social History Main Topics  . Smoking status: Never Smoker  . Smokeless tobacco: Never Used  . Alcohol use No  . Drug use: No  . Sexual activity: Not Currently   Other Topics Concern  . None   Social History Narrative  . None     Family History:  The patient's family history includes Cancer in his father, maternal grandmother, mother, paternal aunt, paternal grandfather, paternal uncle, and paternal uncle; Diabetes in his maternal aunt.   ROS:   Please see the history of present illness.    Somewhat dizzy when he stands on occasion but has not had syncope or protracted problems.  All other systems reviewed and are negative.   PHYSICAL EXAM:   VS:  BP 126/88 (BP Location: Right  Arm)   Pulse 75   Ht 5\' 8"  (1.727 m)   Wt 189 lb 12.8 oz (86.1 kg)   BMI 28.86 kg/m    GEN: Well nourished, well developed, in no acute distress  HEENT: normal  Neck: no JVD, carotid bruits, or masses Cardiac: RRR; no murmurs, rubs, or gallops,no edema  Respiratory:  clear to auscultation bilaterally, normal work of breathing GI: soft, nontender, nondistended, + BS MS: no deformity or atrophy  Skin: warm and dry, no rash Neuro:  Alert and Oriented x 3, Strength and sensation are intact Psych: euthymic mood, full affect  Wt Readings from Last 3 Encounters:  03/18/17 189 lb 12.8 oz (86.1 kg)  03/27/16 208 lb (94.3 kg)  03/11/16 208 lb 12.8 oz (94.7 kg)      Studies/Labs Reviewed:   EKG:  EKG  Normal sinus rhythm with these 5 and V6 ST elevation. This  is unchanged from prior.  Recent Labs: No results found for requested labs within last 8760 hours.   Lipid Panel No results found for: CHOL, TRIG, HDL, CHOLHDL, VLDL, LDLCALC, LDLDIRECT  Additional studies/ records that were reviewed today include:  No new data    ASSESSMENT:    1. Atherosclerosis of native coronary artery of native heart without angina pectoris   2. Hyperlipidemia, unspecified hyperlipidemia type   3. Schizoaffective disorder, bipolar type ([place])      PLAN:  In order of problems listed above:  1. Stable with excellent exercise tolerance. No evaluation is needed at this time. Continued exercise. Clinical follow-up in one year. 2. LDL target is 70. Last LDL was 88 in January 2018. Low-fat diet and exercise or encouraged. Elevated triglyceride over 400. Recommend cutting saturated fats. TSH needs to be checked. Weight loss. If upon recheck it is still elevated, can sit or fiber 8 therapy.   Clinical follow-up in one year. Continue exercise.   Medication Adjustments/Labs and Tests Ordered: Current medicines are reviewed at length with the patient today.  Concerns regarding medicines are outlined above.  Medication changes, Labs and Tests ordered today are listed in the Patient Instructions below. There are no Patient Instructions on file for this visit.   Signed, [name], MD  03/18/2017 4:29 PM    [facility], [place], [place]  [address] Phone: [phone]; Fax: [phone]

## 2017-03-18 ENCOUNTER — Encounter (INDEPENDENT_AMBULATORY_CARE_PROVIDER_SITE_OTHER): Payer: Self-pay

## 2017-03-18 ENCOUNTER — Encounter: Payer: Self-pay | Admitting: Interventional Cardiology

## 2017-03-18 ENCOUNTER — Ambulatory Visit (INDEPENDENT_AMBULATORY_CARE_PROVIDER_SITE_OTHER): Payer: Medicare Other | Admitting: Interventional Cardiology

## 2017-03-18 VITALS — BP 126/88 | HR 75 | Ht 68.0 in | Wt 189.8 lb

## 2017-03-18 DIAGNOSIS — I2510 Atherosclerotic heart disease of native coronary artery without angina pectoris: Secondary | ICD-10-CM

## 2017-03-18 DIAGNOSIS — F250 Schizoaffective disorder, bipolar type: Secondary | ICD-10-CM

## 2017-03-18 DIAGNOSIS — E785 Hyperlipidemia, unspecified: Secondary | ICD-10-CM

## 2017-03-18 NOTE — Patient Instructions (Signed)

## 2017-05-07 ENCOUNTER — Encounter: Payer: Self-pay | Admitting: Podiatry

## 2017-05-07 ENCOUNTER — Ambulatory Visit (INDEPENDENT_AMBULATORY_CARE_PROVIDER_SITE_OTHER): Payer: Medicare Other | Admitting: Podiatry

## 2017-05-07 DIAGNOSIS — B351 Tinea unguium: Secondary | ICD-10-CM

## 2017-05-07 DIAGNOSIS — M79676 Pain in unspecified toe(s): Secondary | ICD-10-CM

## 2017-05-07 NOTE — Progress Notes (Signed)
   Subjective:    Patient ID: Brandon Oliver, male    DOB: [birth year], 71 y.o.   MRN: [id number]  HPIThis patient presents to the office stating his nails have grown thick and long. He states he is unable to self treat.  He is not diabetic.  He presents for preventive foot care services.    Review of Systems  All other systems reviewed and are negative.      Objective:   Physical Exam GENERAL APPEARANCE: Alert, conversant. Appropriately groomed. No acute distress.  VASCULAR: Pedal pulses are  palpable at  [facility] and PT bilateral.  Capillary refill time is immediate to all digits,  Normal temperature gradient.  Digital hair growth is present bilateral  NEUROLOGIC: sensation is normal to 5.07 monofilament at 5/5 sites bilateral.  Light touch is intact bilateral, Muscle strength normal.  MUSCULOSKELETAL: acceptable muscle strength, tone and stability bilateral.  Intrinsic muscluature intact bilateral.  Rectus appearance of foot and digits noted bilateral.   DERMATOLOGIC: skin color, texture, and turgor are within normal limits.  No preulcerative lesions or ulcers  are seen, no interdigital maceration noted.  No open lesions present. . No drainage noted.  NAILS  Thick disfigured hallux  Toenails  Both feet.            Assessment & Plan:  Onychomycosis  Hallux  B/L   IE  Debridement of nails  B/L   Gardiner Barefoot DPM

## 2017-08-06 ENCOUNTER — Ambulatory Visit: Payer: Medicare Other | Admitting: Podiatry

## 2017-08-06 ENCOUNTER — Encounter: Payer: Self-pay | Admitting: Podiatry

## 2017-08-06 DIAGNOSIS — B351 Tinea unguium: Secondary | ICD-10-CM

## 2017-08-06 DIAGNOSIS — M79676 Pain in unspecified toe(s): Secondary | ICD-10-CM | POA: Diagnosis not present

## 2017-08-06 NOTE — Progress Notes (Signed)
Complaint:  Visit Type: Patient returns to my office for continued preventative foot care services. Complaint: Patient states" my nails have grown long and thick and become painful to walk and wear shoes" . The patient presents for preventative foot care services. No changes to ROS  Podiatric Exam: Vascular: dorsalis pedis and posterior tibial pulses are palpable bilateral. Capillary return is immediate. Temperature gradient is WNL. Skin turgor WNL  Sensorium: Normal Semmes Weinstein monofilament test. Normal tactile sensation bilaterally. Nail Exam: Pt has thick disfigured discolored nails with subungual debris noted bilateral entire nail hallux through fifth toenails Ulcer Exam: There is no evidence of ulcer or pre-ulcerative changes or infection. Orthopedic Exam: Muscle tone and strength are WNL. No limitations in general ROM. No crepitus or effusions noted. Foot type and digits show no abnormalities. Bony prominences are unremarkable. Skin: No Porokeratosis. No infection or ulcers  Diagnosis:  Onychomycosis, , Pain in right toe, pain in left toes  Treatment & Plan Procedures and Treatment: Consent by patient was obtained for treatment procedures.   Debridement of mycotic and hypertrophic toenails, 1 through 5 bilateral and clearing of subungual debris. No ulceration, no infection noted.  Return Visit-Office Procedure: Patient instructed to return to the office for a follow up visit 3 months for continued evaluation and treatment.    Ethin Drummond DPM 

## 2017-08-08 ENCOUNTER — Telehealth (HOSPITAL_COMMUNITY): Payer: Self-pay | Admitting: Psychiatry

## 2017-08-08 NOTE — Telephone Encounter (Signed)
Patient came to pick up medical records from August. DL #: [id number]

## 2017-09-10 ENCOUNTER — Ambulatory Visit (HOSPITAL_COMMUNITY): Payer: Medicare Other | Admitting: Psychiatry

## 2017-09-10 ENCOUNTER — Encounter (HOSPITAL_COMMUNITY): Payer: Self-pay | Admitting: Psychiatry

## 2017-09-10 DIAGNOSIS — F250 Schizoaffective disorder, bipolar type: Secondary | ICD-10-CM | POA: Diagnosis not present

## 2017-09-10 MED ORDER — SERTRALINE HCL 100 MG PO TABS: 100.0000 mg | ORAL_TABLET | Freq: Every day | ORAL | 1 refills | Status: DC

## 2017-09-10 MED ORDER — RISPERIDONE 2 MG PO TABS: 2.0000 mg | ORAL_TABLET | Freq: Every day | ORAL | 1 refills | Status: DC

## 2017-09-10 NOTE — Progress Notes (Signed)
BH MD/PA/NP OP Progress Note  09/10/2017 1:25 PM Brandon Oliver  MRN:  [id number]  Chief Complaint: I am doing good.  I am taking my medication.  I recently saw my primary care physician and my triglycerides are high.  I am not taking Crestor.  HPI: Patient came for his follow-up appointment.  Is compliant with Risperdal and Zoloft.  He denies any side effects.  He had a good holidays.  He continued to volunteer work.  Trying to help his friend who has been on dialysis.  He usually try to pick her up and take to the doctor's appointment.  He tried to keep himself busy.  He is sleeping good.  His thinking is clear and he denies any paranoia, hallucination or any disorganized behavior.  He recently seen his primary care physician and found that he had high triglyceride.  His medicines are changed.  He is no longer taking Zocor and now he is taking Crestor 20 mg.  Patient is hoping to visit her sister in March who lives in [place].  He is excited because his brother and nephew is also coming from [place].  Patient wants to continue his current psychiatric medication.  Lately he is having issues with insurance company because they are not paying for his visits.  He is sending them medical records to resolve the issues.  His energy level is good.  His vital signs are stable.  He is not drinking or using any illegal substances.  Visit Diagnosis:    ICD-10-CM   1. Schizoaffective disorder, bipolar type ([place]) F25.0 risperiDONE (RISPERDAL) 2 MG tablet    sertraline (ZOLOFT) 100 MG tablet    Past Psychiatric History: Reviewed. Patient has one psychiatric hospitalization in 2005 when he was admitted due to psychosis and paranoia at behavioral [place].    Past Medical History:  Past Medical History:  Diagnosis Date  . Cataracts, both eyes    none mature  . History of kidney stones    x3 episodes  . Hyperlipidemia   . Myocardial infarction [facility])    coronary stents x2 at that time.  Marland Kitchen  Retina disorder    "floaters" ? tear- Dr. Zigmund Daniel follows  . Schizoaffective disorder, bipolar type ([place])    sees MD every 3- 6 months- controlled.    Past Surgical History:  Procedure Laterality Date  . CARDIAC CATHETERIZATION     stents x2- Dr. Linard Millers follows  . COLONOSCOPY WITH PROPOFOL N/A 02/07/2015   Procedure: COLONOSCOPY WITH PROPOFOL;  Surgeon: Garlan Fair, MD;  [facility]: [facility];  Service: Endoscopy;  Laterality: N/A;  . HEMORROIDECTOMY      Family Psychiatric History: Viewed.  Family History:  Family History  Problem Relation Age of Onset  . Cancer Mother        breast  . Cancer Father        colon  . Diabetes Maternal Aunt   . Cancer Paternal Aunt        "male" cancer  . Cancer Paternal Uncle        pancreatic  . Cancer Paternal Grandfather        colon  . Cancer Paternal Uncle        lung  . Cancer Maternal Grandmother        pancreatic    Social History:  Social History   Socioeconomic History  . Marital status: Single    Spouse name: Not on file  . Number of children: Not  on file  . Years of education: Not on file  . Highest education level: Not on file  Social Needs  . Financial resource strain: Not on file  . Food insecurity - worry: Not on file  . Food insecurity - inability: Not on file  . Transportation needs - medical: Not on file  . Transportation needs - non-medical: Not on file  Occupational History  . Not on file  Tobacco Use  . Smoking status: Never Smoker  . Smokeless tobacco: Never Used  Substance and Sexual Activity  . Alcohol use: No    Alcohol/week: 0.0 oz  . Drug use: No  . Sexual activity: Not Currently  Other Topics Concern  . Not on file  Social History Narrative  . Not on file    Allergies:  Allergies  Allergen Reactions  . Penicillins Other (See Comments)    "unsure childhood allergen"  . Benadryl [Diphenhydramine Hcl] Rash    Metabolic Disorder Labs: No results found for: HGBA1C, MPG No  results found for: PROLACTIN No results found for: CHOL, TRIG, HDL, CHOLHDL, VLDL, LDLCALC No results found for: TSH  Therapeutic Level Labs: No results found for: LITHIUM No results found for: VALPROATE No components found for:  CBMZ  Current Medications: Current Outpatient Medications  Medication Sig Dispense Refill  . aspirin 81 MG tablet Take 81 mg by mouth daily.    . carvedilol (COREG) 3.125 MG tablet Take 3.125 mg by mouth 2 (two) times daily with a meal.     . Coenzyme Q10 (COQ10) 100 MG CAPS Take 100 mg by mouth daily.    . Multiple Vitamin (MULTIVITAMIN) tablet Take 1 tablet by mouth daily.    . Omega-3 Fatty Acids (FISH OIL) 1000 MG CAPS Take 1,000 mg by mouth daily.     . risperiDONE (RISPERDAL) 2 MG tablet Take 1 tablet (2 mg total) by mouth at bedtime. 90 tablet 1  . sertraline (ZOLOFT) 100 MG tablet Take 1 tablet (100 mg total) by mouth daily. 90 tablet 1  . simvastatin (ZOCOR) 40 MG tablet Take 40 mg by mouth daily.      No current facility-administered medications for this visit.      Musculoskeletal: Strength & Muscle Tone: within normal limits Gait & Station: normal Patient leans: N/A  Psychiatric Specialty Exam: ROS  There were no vitals taken for this visit.There is no height or weight on file to calculate BMI.  General Appearance: Casual  Eye Contact:  Good  Speech:  Clear and Coherent  Volume:  Normal  Mood:  Euthymic  Affect:  Appropriate  Thought Process:  Goal Directed  Orientation:  Full (Time, Place, and Person)  Thought Content: Logical   Suicidal Thoughts:  No  Homicidal Thoughts:  No  Memory:  Immediate;   Good Recent;   Good Remote;   Good  Judgement:  Good  Insight:  Good  Psychomotor Activity:  Normal  Concentration:  Concentration: Good and Attention Span: Good  Recall:  Good  Fund of Knowledge: Good  Language: Good  Akathisia:  No  Handed:  Right  AIMS (if indicated): not done  Assets:  Communication Skills Desire for  Improvement Housing Resilience Social Support  ADL's:  Intact  Cognition: WNL  Sleep:  Good   Screenings:   Assessment and Plan: Schizoaffective disorder, bipolar type.  Patient is a stable on his current psychiatric medication.  He has no side effects including tremors shakes or any EPS.  He does not want to  reduce the dose.  Continue Risperdal 2 mg at bedtime and Zoloft 100 mg daily.  Discussed medication side effects and benefits.  Encourage healthy lifestyle and watch his calorie intake and do regular exercise.  He is not interested in counseling.  Follow-up in 6 months.   [name], MD 09/10/2017, 1:25 PM

## 2017-10-07 ENCOUNTER — Ambulatory Visit (INDEPENDENT_AMBULATORY_CARE_PROVIDER_SITE_OTHER): Payer: Medicare Other | Admitting: Ophthalmology

## 2017-10-07 DIAGNOSIS — H2513 Age-related nuclear cataract, bilateral: Secondary | ICD-10-CM | POA: Diagnosis not present

## 2017-10-07 DIAGNOSIS — D3132 Benign neoplasm of left choroid: Secondary | ICD-10-CM | POA: Diagnosis not present

## 2017-10-07 DIAGNOSIS — H43813 Vitreous degeneration, bilateral: Secondary | ICD-10-CM

## 2017-10-07 DIAGNOSIS — H33301 Unspecified retinal break, right eye: Secondary | ICD-10-CM | POA: Diagnosis not present

## 2017-11-05 ENCOUNTER — Ambulatory Visit: Payer: Medicare Other | Admitting: Podiatry

## 2017-11-05 ENCOUNTER — Encounter: Payer: Self-pay | Admitting: Podiatry

## 2017-11-05 DIAGNOSIS — B351 Tinea unguium: Secondary | ICD-10-CM | POA: Diagnosis not present

## 2017-11-05 DIAGNOSIS — M79676 Pain in unspecified toe(s): Secondary | ICD-10-CM | POA: Diagnosis not present

## 2017-11-05 NOTE — Progress Notes (Signed)
Complaint:  Visit Type: Patient returns to my office for continued preventative foot care services. Complaint: Patient states" my nails have grown long and thick and become painful to walk and wear shoes" . The patient presents for preventative foot care services. No changes to ROS  Podiatric Exam: Vascular: dorsalis pedis and posterior tibial pulses are palpable bilateral. Capillary return is immediate. Temperature gradient is WNL. Skin turgor WNL  Sensorium: Normal Semmes Weinstein monofilament test. Normal tactile sensation bilaterally. Nail Exam: Pt has thick disfigured discolored nails with subungual debris noted bilateral entire nail hallux through fifth toenails Ulcer Exam: There is no evidence of ulcer or pre-ulcerative changes or infection. Orthopedic Exam: Muscle tone and strength are WNL. No limitations in general ROM. No crepitus or effusions noted. Foot type and digits show no abnormalities. Bony prominences are unremarkable. Skin: No Porokeratosis. No infection or ulcers  Diagnosis:  Onychomycosis, , Pain in right toe, pain in left toes  Treatment & Plan Procedures and Treatment: Consent by patient was obtained for treatment procedures.   Debridement of mycotic and hypertrophic toenails, 1 through 5 bilateral and clearing of subungual debris. No ulceration, no infection noted.  Return Visit-Office Procedure: Patient instructed to return to the office for a follow up visit 3 months for continued evaluation and treatment.    Leatha Rohner DPM 

## 2018-02-04 ENCOUNTER — Encounter: Payer: Self-pay | Admitting: Podiatry

## 2018-02-04 ENCOUNTER — Ambulatory Visit: Payer: Medicare Other | Admitting: Podiatry

## 2018-02-04 DIAGNOSIS — M79676 Pain in unspecified toe(s): Secondary | ICD-10-CM

## 2018-02-04 DIAGNOSIS — B351 Tinea unguium: Secondary | ICD-10-CM

## 2018-02-04 NOTE — Progress Notes (Signed)
Complaint:  Visit Type: Patient returns to my office for continued preventative foot care services. Complaint: Patient states" my nails have grown long and thick and become painful to walk and wear shoes" . The patient presents for preventative foot care services. No changes to ROS  Podiatric Exam: Vascular: dorsalis pedis and posterior tibial pulses are palpable bilateral. Capillary return is immediate. Temperature gradient is WNL. Skin turgor WNL  Sensorium: Normal Semmes Weinstein monofilament test. Normal tactile sensation bilaterally. Nail Exam: Pt has thick disfigured discolored nails with subungual debris noted bilateral entire nail hallux through fifth toenails Ulcer Exam: There is no evidence of ulcer or pre-ulcerative changes or infection. Orthopedic Exam: Muscle tone and strength are WNL. No limitations in general ROM. No crepitus or effusions noted. Foot type and digits show no abnormalities. Bony prominences are unremarkable. Skin: No Porokeratosis. No infection or ulcers  Diagnosis:  Onychomycosis, , Pain in right toe, pain in left toes  Treatment & Plan Procedures and Treatment: Consent by patient was obtained for treatment procedures.   Debridement of mycotic and hypertrophic toenails, 1 through 5 bilateral and clearing of subungual debris. No ulceration, no infection noted.  Return Visit-Office Procedure: Patient instructed to return to the office for a follow up visit 3 months for continued evaluation and treatment.    Ossiel Marchio DPM 

## 2018-03-10 ENCOUNTER — Ambulatory Visit (HOSPITAL_COMMUNITY): Payer: Self-pay | Admitting: Psychiatry

## 2018-03-30 ENCOUNTER — Other Ambulatory Visit (HOSPITAL_COMMUNITY): Payer: Self-pay | Admitting: Psychiatry

## 2018-03-30 DIAGNOSIS — F250 Schizoaffective disorder, bipolar type: Secondary | ICD-10-CM

## 2018-05-03 NOTE — Progress Notes (Signed)
Cardiology Office Note:    Date:  05/04/2018   ID:  Brandon Oliver, DOB 01-Feb-1946, MRN [id number]  PCP:  [name], MD  Cardiologist:  [name], MD   Referring MD: [name], MD   Chief Complaint  Patient presents with  . Coronary Artery Disease  . Atrial Fibrillation    History of Present Illness:    Brandon Oliver is a 72 y.o. male with a hx of CAD, history of bare metal stent circumflex 1998, hyperlipidemia, and schizoaffective disorder.   Overall, Brandon Oliver is doing well.  He is having some sadness because his brother has stage IV pancreatic cancer and no cure insight.  States he is no longer worried about his heart but about whether he will develop pancreatic cancer.  His grandmother, and uncle and now his brother have all developed pancreatic CA.  He denies orthopnea, PND, chest pain, ankle edema, and palpitations.   Past Medical History:  Diagnosis Date  . Cataracts, both eyes    none mature  . History of kidney stones    x3 episodes  . Hyperlipidemia   . Myocardial infarction [facility])    coronary stents x2 at that time.  Marland Kitchen Retina disorder    "floaters" ? tear- Dr. Zigmund Daniel follows  . Schizoaffective disorder, bipolar type ([place])    sees MD every 3- 6 months- controlled.    Past Surgical History:  Procedure Laterality Date  . CARDIAC CATHETERIZATION     stents x2- Dr. Linard Millers follows  . COLONOSCOPY WITH PROPOFOL N/A 02/07/2015   Procedure: COLONOSCOPY WITH PROPOFOL;  Surgeon: Garlan Fair, MD;  Location: [facility];  Service: Endoscopy;  Laterality: N/A;  . HEMORROIDECTOMY      Current Medications: Current Meds  Medication Sig  . aspirin 81 MG tablet Take 81 mg by mouth daily.  . carvedilol (COREG) 3.125 MG tablet Take 3.125 mg by mouth 2 (two) times daily with a meal.   . Coenzyme Q10 (COQ10) 100 MG CAPS Take 100 mg by mouth daily.  . Multiple Vitamin (MULTIVITAMIN) tablet Take 1 tablet by mouth daily.  . Omega-3 Fatty  Acids (FISH OIL) 1000 MG CAPS Take 1,000 mg by mouth daily.   . risperiDONE (RISPERDAL) 2 MG tablet TAKE 1 TABLET (2 MG TOTAL) BY MOUTH AT BEDTIME.  . rosuvastatin (CRESTOR) 20 MG tablet Take 20 mg by mouth daily.  . sertraline (ZOLOFT) 100 MG tablet TAKE 1 TABLET BY MOUTH EVERY DAY     Allergies:   Penicillins and Benadryl [diphenhydramine hcl]   Social History   Socioeconomic History  . Marital status: Single    Spouse name: Not on file  . Number of children: Not on file  . Years of education: Not on file  . Highest education level: Not on file  Occupational History  . Not on file  Social Needs  . Financial resource strain: Not on file  . Food insecurity:    Worry: Not on file    Inability: Not on file  . Transportation needs:    Medical: Not on file    Non-medical: Not on file  Tobacco Use  . Smoking status: Never Smoker  . Smokeless tobacco: Never Used  Substance and Sexual Activity  . Alcohol use: No    Alcohol/week: 0.0 standard drinks  . Drug use: No  . Sexual activity: Not Currently  Lifestyle  . Physical activity:    Days per week: Not on file  Minutes per session: Not on file  . Stress: Not on file  Relationships  . Social connections:    Talks on phone: Not on file    Gets together: Not on file    Attends religious service: Not on file    Active member of club or organization: Not on file    Attends meetings of clubs or organizations: Not on file    Relationship status: Not on file  Other Topics Concern  . Not on file  Social History Narrative  . Not on file     Family History: The patient's family history includes Cancer in his father, maternal grandmother, mother, paternal aunt, paternal grandfather, paternal uncle, and paternal uncle; Diabetes in his maternal aunt.  ROS:   Please see the history of present illness.    Brandon Oliver concerning the health of his brother.  All other systems reviewed and are negative.  EKGs/Labs/Other Studies Reviewed:     The following studies were reviewed today:  Myocardial perfusion imaging August 2017: Study Highlights    The left ventricular ejection fraction is normal (55-65%).  Nuclear stress EF: 60%.  There was no ST segment deviation noted during stress.  Defect 1: There is a medium defect of severe severity present in the basal inferolateral, basal anterolateral, mid inferolateral, mid anterolateral and apical lateral location.  Findings consistent with prior mid-basal lateral wall myocardial infarction.  This is a low risk study. There is no evidence of ischemia       EKG:  EKG is  ordered today.  The ekg ordered today demonstrates normal sinus rhythm with small inferior Q waves.  When compared to the prior tracing of 2018, no changes noted.  Recent Labs: No results found for requested labs within last 8760 hours.  Recent Lipid Panel No results found for: CHOL, TRIG, HDL, CHOLHDL, VLDL, LDLCALC, LDLDIRECT  Physical Exam:    VS:  BP 122/74   Pulse 77   Ht 5' 8.5" (1.74 m)   Wt 187 lb 1.9 oz (84.9 kg)   BMI 28.04 kg/m     Wt Readings from Last 3 Encounters:  05/04/18 187 lb 1.9 oz (84.9 kg)  03/18/17 189 lb 12.8 oz (86.1 kg)  03/27/16 208 lb (94.3 kg)     GEN: Pale appearing.  Well nourished, well developed in no acute distress HEENT: Normal NECK: No JVD. LYMPHATICS: No lymphadenopathy CARDIAC: Affirmed RRR, no murmur, no gallop, no edema. VASCULAR: 2+ bilateral radial and carotid pulses.  No bruits. RESPIRATORY:  Clear to auscultation without rales, wheezing or rhonchi  ABDOMEN: Soft, non-tender, non-distended, No pulsatile mass, MUSCULOSKELETAL: No deformity  SKIN: Warm and dry NEUROLOGIC:  Alert and oriented x 3 PSYCHIATRIC:  Normal affect   ASSESSMENT:    1. Atherosclerosis of coronary artery of native heart with stable angina pectoris, unspecified vessel or lesion type ([place])   2. Mixed hyperlipidemia   3. Schizoaffective disorder, bipolar type ([place])     PLAN:    In order of problems listed above:  1. Stable without angina.  We discussed risk factor modification.  He is falling short on physical activity.  We discussed the 150 minutes or greater aerobic activity per week metric.  Encouraged him to increase activity. 2. LDL target less than 70.  On rosuvastatin.  Consider switching from omega-3 fish oil to Icosapent/Vascepa.  Increase aerobic activity,   Medication Adjustments/Labs and Tests Ordered: Current medicines are reviewed at length with the patient today.  Concerns regarding medicines are outlined  above.  Orders Placed This Encounter  Procedures  . EKG 12-Lead   No orders of the defined types were placed in this encounter.   Patient Instructions  Medication Instructions:  Your provider recommends that you continue on your current medications as directed. Please refer to the Current Medication list given to you today.    Labwork: None  Testing/Procedures: None  Follow-Up: Your provider wants you to follow-up in: 1 year with Dr. Tamala Julian. You will receive a reminder letter in the mail two months in advance. If you don't receive a letter, please call our office to schedule the follow-up appointment.    Any Other Special Instructions Will Be Listed Below (If Applicable). Please try to increase your aerobic activity up to 150 minutes per week!    Signed, [name], MD  05/04/2018 11:33 AM    [place]

## 2018-05-04 ENCOUNTER — Encounter: Payer: Self-pay | Admitting: Interventional Cardiology

## 2018-05-04 ENCOUNTER — Encounter

## 2018-05-04 ENCOUNTER — Ambulatory Visit: Payer: Medicare Other | Admitting: Interventional Cardiology

## 2018-05-04 VITALS — BP 122/74 | HR 77 | Ht 68.5 in | Wt 187.1 lb

## 2018-05-04 DIAGNOSIS — F250 Schizoaffective disorder, bipolar type: Secondary | ICD-10-CM

## 2018-05-04 DIAGNOSIS — I25118 Atherosclerotic heart disease of native coronary artery with other forms of angina pectoris: Secondary | ICD-10-CM

## 2018-05-04 DIAGNOSIS — E782 Mixed hyperlipidemia: Secondary | ICD-10-CM

## 2018-05-04 NOTE — Patient Instructions (Signed)
Medication Instructions:  Your provider recommends that you continue on your current medications as directed. Please refer to the Current Medication list given to you today.    Labwork: None  Testing/Procedures: None  Follow-Up: Your provider wants you to follow-up in: 1 year with Dr. Tamala Julian. You will receive a reminder letter in the mail two months in advance. If you don't receive a letter, please call our office to schedule the follow-up appointment.    Any Other Special Instructions Will Be Listed Below (If Applicable). Please try to increase your aerobic activity up to 150 minutes per week!

## 2018-05-06 ENCOUNTER — Ambulatory Visit: Payer: Medicare Other | Admitting: Podiatry

## 2018-05-06 ENCOUNTER — Ambulatory Visit (HOSPITAL_COMMUNITY): Payer: Self-pay | Admitting: Psychiatry

## 2018-05-07 ENCOUNTER — Other Ambulatory Visit: Payer: Self-pay

## 2018-05-07 ENCOUNTER — Encounter (HOSPITAL_COMMUNITY): Payer: Self-pay | Admitting: Psychiatry

## 2018-05-07 ENCOUNTER — Ambulatory Visit (INDEPENDENT_AMBULATORY_CARE_PROVIDER_SITE_OTHER): Payer: Medicare Other | Admitting: Psychiatry

## 2018-05-07 DIAGNOSIS — F250 Schizoaffective disorder, bipolar type: Secondary | ICD-10-CM

## 2018-05-07 MED ORDER — RISPERIDONE 2 MG PO TABS: 2.0000 mg | ORAL_TABLET | Freq: Every day | ORAL | 1 refills | Status: DC

## 2018-05-07 MED ORDER — SERTRALINE HCL 100 MG PO TABS: 100.0000 mg | ORAL_TABLET | Freq: Every day | ORAL | 1 refills | Status: DC

## 2018-05-07 NOTE — Progress Notes (Signed)
BH MD/PA/NP OP Progress Note  05/07/2018 2:02 PM Brandon Oliver  MRN:  [id number]  Chief Complaint: My brother diagnosed with pancreatic cancer.  HPI: Brandon Oliver came for his follow-up appointment.  He is taking his medication as prescribed.  He told that lately he is concerned about his brother who lives in [place] and diagnosed with pancreatic cancer.  Patient told he had a good time in the summer with his sister and brother.  He relieved that he had a enjoyed the company this summer with his siblings.  Recently he is seeing cardiologist and is happy that everything went well.  Patient denies any paranoia, hallucination, suicidal thoughts or homicidal thought.  His energy level is good.  He continues to help his good friend who was a kidney failure and he takes her to dialysis and doctor's appointment.  Patient denies drinking or using any illegal substances.  He has no tremors or shakes.  He wants to keep his medication as it is.   Visit Diagnosis:    ICD-10-CM   1. Schizoaffective disorder, bipolar type ([place]) F25.0 risperiDONE (RISPERDAL) 2 MG tablet    sertraline (ZOLOFT) 100 MG tablet    Past Psychiatric History: Reviewed and updated Patient has one psychiatric hospitalization in 2005 when he was admitted due to psychosis and paranoia at behavioral [place].  Past Medical History:  Past Medical History:  Diagnosis Date  . Cataracts, both eyes    none mature  . History of kidney stones    x3 episodes  . Hyperlipidemia   . Myocardial infarction [facility])    coronary stents x2 at that time.  Marland Kitchen Retina disorder    "floaters" ? tear- Dr. Zigmund Daniel follows  . Schizoaffective disorder, bipolar type ([place])    sees MD every 3- 6 months- controlled.    Past Surgical History:  Procedure Laterality Date  . CARDIAC CATHETERIZATION     stents x2- Dr. Linard Millers follows  . COLONOSCOPY WITH PROPOFOL N/A 02/07/2015   Procedure: COLONOSCOPY WITH PROPOFOL;  Surgeon: Garlan Fair, MD;   [facility];  Service: Endoscopy;  Laterality: N/A;  . HEMORROIDECTOMY      Family Psychiatric History: Reviewed  Family History:  Family History  Problem Relation Age of Onset  . Cancer Mother        breast  . Cancer Father        colon  . Diabetes Maternal Aunt   . Cancer Paternal Aunt        "male" cancer  . Cancer Paternal Uncle        pancreatic  . Cancer Paternal Grandfather        colon  . Cancer Paternal Uncle        lung  . Cancer Maternal Grandmother        pancreatic    Social History:  Social History   Socioeconomic History  . Marital status: Single    Spouse name: Not on file  . Number of children: Not on file  . Years of education: Not on file  . Highest education level: Not on file  Occupational History  . Not on file  Social Needs  . Financial resource strain: Not on file  . Food insecurity:    Worry: Not on file    Inability: Not on file  . Transportation needs:    Medical: Not on file    Non-medical: Not on file  Tobacco Use  . Smoking status: Never Smoker  . Smokeless  tobacco: Never Used  Substance and Sexual Activity  . Alcohol use: No    Alcohol/week: 0.0 standard drinks  . Drug use: No  . Sexual activity: Not Currently  Lifestyle  . Physical activity:    Days per week: Not on file    Minutes per session: Not on file  . Stress: Not on file  Relationships  . Social connections:    Talks on phone: Not on file    Gets together: Not on file    Attends religious service: Not on file    Active member of club or organization: Not on file    Attends meetings of clubs or organizations: Not on file    Relationship status: Not on file  Other Topics Concern  . Not on file  Social History Narrative  . Not on file    Allergies:  Allergies  Allergen Reactions  . Penicillins Other (See Comments)    "unsure childhood allergen"  . Benadryl [Diphenhydramine Hcl] Rash    Metabolic Disorder Labs: No results found for:  HGBA1C, MPG No results found for: PROLACTIN No results found for: CHOL, TRIG, HDL, CHOLHDL, VLDL, LDLCALC No results found for: TSH  Therapeutic Level Labs: No results found for: LITHIUM No results found for: VALPROATE No components found for:  CBMZ  Current Medications: Current Outpatient Medications  Medication Sig Dispense Refill  . aspirin 81 MG tablet Take 81 mg by mouth daily.    . carvedilol (COREG) 3.125 MG tablet Take 3.125 mg by mouth 2 (two) times daily with a meal.     . Coenzyme Q10 (COQ10) 100 MG CAPS Take 100 mg by mouth daily.    . Multiple Vitamin (MULTIVITAMIN) tablet Take 1 tablet by mouth daily.    . Omega-3 Fatty Acids (FISH OIL) 1000 MG CAPS Take 1,000 mg by mouth daily.     . risperiDONE (RISPERDAL) 2 MG tablet TAKE 1 TABLET (2 MG TOTAL) BY MOUTH AT BEDTIME. 90 tablet 0  . rosuvastatin (CRESTOR) 20 MG tablet Take 20 mg by mouth daily.  3  . sertraline (ZOLOFT) 100 MG tablet TAKE 1 TABLET BY MOUTH EVERY DAY 90 tablet 0   No current facility-administered medications for this visit.      Musculoskeletal: Strength & Muscle Tone: within normal limits Gait & Station: normal Patient leans: N/A  Psychiatric Specialty Exam: ROS  Blood pressure 131/78, pulse 80, resp. rate 18, weight 189 lb 12.8 oz (86.1 kg).There is no height or weight on file to calculate BMI.  General Appearance: Casual  Eye Contact:  Good  Speech:  Clear and Coherent  Volume:  Normal  Mood:  Euthymic  Affect:  Appropriate  Thought Process:  Goal Directed  Orientation:  Full (Time, Place, and Person)  Thought Content: Logical   Suicidal Thoughts:  No  Homicidal Thoughts:  No  Memory:  Immediate;   Good Recent;   Good Remote;   Good  Judgement:  Good  Insight:  Good  Psychomotor Activity:  Normal  Concentration:  Concentration: Good and Attention Span: Good  Recall:  Good  Fund of Knowledge: Good  Language: Good  Akathisia:  No  Handed:  Right  AIMS (if indicated): not done   Assets:  Communication Skills Desire for Improvement Housing Resilience Social Support  ADL's:  Intact  Cognition: WNL  Sleep:  Good   Screenings:   Assessment and Plan: Schizoaffective disorder bipolar type.  Patient is stable on his current medication.  Continue Risperdal 2 mg daily  and Zoloft 100 mg daily.  Discussed medication side effects.  He offered counseling and therapy but patient refused.  Reassurance given.  Recommended to call us back if he has any question or any concern..  Follow-up in 6 months.   [name], MD 05/07/2018, 2:02 PM

## 2018-05-28 ENCOUNTER — Encounter

## 2018-07-07 ENCOUNTER — Ambulatory Visit: Payer: Medicare Other | Admitting: Podiatry

## 2018-07-28 ENCOUNTER — Encounter: Payer: Self-pay | Admitting: Podiatry

## 2018-07-28 ENCOUNTER — Ambulatory Visit: Payer: Medicare Other | Admitting: Podiatry

## 2018-07-28 DIAGNOSIS — M79676 Pain in unspecified toe(s): Secondary | ICD-10-CM | POA: Diagnosis not present

## 2018-07-28 DIAGNOSIS — B351 Tinea unguium: Secondary | ICD-10-CM | POA: Diagnosis not present

## 2018-07-28 NOTE — Progress Notes (Signed)
Complaint:  Visit Type: Patient returns to my office for continued preventative foot care services. Complaint: Patient states" my nails have grown long and thick and become painful to walk and wear shoes" . The patient presents for preventative foot care services. No changes to ROS  Podiatric Exam: Vascular: dorsalis pedis and posterior tibial pulses are palpable bilateral. Capillary return is immediate. Temperature gradient is WNL. Skin turgor WNL  Sensorium: Normal Semmes Weinstein monofilament test. Normal tactile sensation bilaterally. Nail Exam: Pt has thick disfigured discolored nails with subungual debris noted bilateral entire nail hallux through fifth toenails Ulcer Exam: There is no evidence of ulcer or pre-ulcerative changes or infection. Orthopedic Exam: Muscle tone and strength are WNL. No limitations in general ROM. No crepitus or effusions noted. Foot type and digits show no abnormalities. Bony prominences are unremarkable. Skin: No Porokeratosis. No infection or ulcers  Diagnosis:  Onychomycosis, , Pain in right toe, pain in left toes  Treatment & Plan Procedures and Treatment: Consent by patient was obtained for treatment procedures.   Debridement of mycotic and hypertrophic toenails, 1 through 5 bilateral and clearing of subungual debris. No ulceration, no infection noted.  Return Visit-Office Procedure: Patient instructed to return to the office for a follow up visit 3 months for continued evaluation and treatment.    Jaggar Benko DPM 

## 2018-10-13 ENCOUNTER — Other Ambulatory Visit: Payer: Self-pay

## 2018-10-13 ENCOUNTER — Encounter (INDEPENDENT_AMBULATORY_CARE_PROVIDER_SITE_OTHER): Payer: Medicare Other | Admitting: Ophthalmology

## 2018-10-13 DIAGNOSIS — H43813 Vitreous degeneration, bilateral: Secondary | ICD-10-CM | POA: Diagnosis not present

## 2018-10-13 DIAGNOSIS — D3132 Benign neoplasm of left choroid: Secondary | ICD-10-CM

## 2018-10-13 DIAGNOSIS — H2513 Age-related nuclear cataract, bilateral: Secondary | ICD-10-CM | POA: Diagnosis not present

## 2018-10-13 DIAGNOSIS — H33301 Unspecified retinal break, right eye: Secondary | ICD-10-CM

## 2018-10-27 ENCOUNTER — Ambulatory Visit: Payer: Medicare Other | Admitting: Podiatry

## 2018-10-31 ENCOUNTER — Other Ambulatory Visit (HOSPITAL_COMMUNITY): Payer: Self-pay | Admitting: Psychiatry

## 2018-10-31 DIAGNOSIS — F250 Schizoaffective disorder, bipolar type: Secondary | ICD-10-CM

## 2018-11-05 ENCOUNTER — Other Ambulatory Visit: Payer: Self-pay

## 2018-11-05 ENCOUNTER — Ambulatory Visit (INDEPENDENT_AMBULATORY_CARE_PROVIDER_SITE_OTHER): Payer: Medicare Other | Admitting: Psychiatry

## 2018-11-05 DIAGNOSIS — F250 Schizoaffective disorder, bipolar type: Secondary | ICD-10-CM

## 2018-11-05 MED ORDER — SERTRALINE HCL 100 MG PO TABS: 100.0000 mg | ORAL_TABLET | Freq: Every day | ORAL | 1 refills | Status: DC

## 2018-11-05 MED ORDER — RISPERIDONE 2 MG PO TABS: 2.0000 mg | ORAL_TABLET | Freq: Every day | ORAL | 1 refills | Status: DC

## 2018-11-05 NOTE — Progress Notes (Signed)
Virtual Visit via Telephone Note  I connected with Brandon Oliver on 11/05/18 at  2:00 PM EDT by telephone and verified that I am speaking with the correct person using two identifiers.   I discussed the limitations, risks, security and privacy concerns of performing an evaluation and management service by telephone and the availability of in person appointments. I also discussed with the patient that there may be a patient responsible charge related to this service. The patient expressed understanding and agreed to proceed.   History of Present Illness: Patient was evaluated through phone session.  He is taking his medication Risperdal and Zoloft and reported no side effects.  His brother died in 06/30/23 in [place] and he is relaxed because he was able to spend 3 to 4 weeks with him in [place].  He continues to help his male friend who goes to dialysis.  He also takes her to the doctor's appointment.  Lately he has been very cautious about pandemic coronavirus and does not leave the house unless it is important.  He is in touch with his sister regularly.  He is sleeping good.  He denies any paranoia, hallucination, suicidal thoughts or homicidal thought.  He reported no tremors or shakes.  He like to continue his medication.  He does not feel that he need therapy.  Past Psychiatric History: H/O inpatient in 2005 due to psychosis and paranoia at behavioral [place].  Observations/Objective: Limited mental status examination done on the phone.  Patient describes his mood okay.  His speech is clear, soft with normal tone and volume.  His thought process logical and goal-directed.  He denies any paranoia or any hallucination.  He denies any active or passive suicidal thoughts or homicidal thought.  His attention and concentration is okay.  He is alert and oriented x3.  His cognition is intact.  His fund of knowledge is okay.  His insight judgment is okay.  Assessment and  Plan: Schizoaffective disorder, bipolar type.  Patient is a stable on his current medication.  He like to continue Risperdal 2 mg at bedtime and Zoloft 100 mg daily.  He reported no tremors shakes or any EPS.  He is getting his blood work from his primary care physician.  I reviewed his current medication.  I offered grief counseling but he does not feel he needed.  Recommended to call us back if he has any question or any concern.  Follow-up in 6 months.  He does not want to lower his medication at this time.  Follow Up Instructions:    I discussed the assessment and treatment plan with the patient. The patient was provided an opportunity to ask questions and all were answered. The patient agreed with the plan and demonstrated an understanding of the instructions.   The patient was advised to call back or seek an in-person evaluation if the symptoms worsen or if the condition fails to improve as anticipated.  I provided 20 minutes of non-face-to-face time during this encounter.   [name], MD

## 2018-12-29 ENCOUNTER — Ambulatory Visit: Payer: Medicare Other | Admitting: Podiatry

## 2019-03-03 ENCOUNTER — Ambulatory Visit: Payer: Medicare Other | Admitting: Podiatry

## 2019-04-22 ENCOUNTER — Other Ambulatory Visit (HOSPITAL_COMMUNITY): Payer: Self-pay | Admitting: Psychiatry

## 2019-04-22 DIAGNOSIS — F250 Schizoaffective disorder, bipolar type: Secondary | ICD-10-CM

## 2019-05-06 ENCOUNTER — Other Ambulatory Visit: Payer: Self-pay

## 2019-05-06 ENCOUNTER — Encounter (HOSPITAL_COMMUNITY): Payer: Self-pay | Admitting: Psychiatry

## 2019-05-06 ENCOUNTER — Ambulatory Visit (INDEPENDENT_AMBULATORY_CARE_PROVIDER_SITE_OTHER): Payer: Medicare Other | Admitting: Psychiatry

## 2019-05-06 DIAGNOSIS — F250 Schizoaffective disorder, bipolar type: Secondary | ICD-10-CM

## 2019-05-06 MED ORDER — SERTRALINE HCL 100 MG PO TABS: 100.0000 mg | ORAL_TABLET | Freq: Every day | ORAL | 1 refills | Status: DC

## 2019-05-06 MED ORDER — RISPERIDONE 2 MG PO TABS: 2.0000 mg | ORAL_TABLET | Freq: Every day | ORAL | 1 refills | Status: DC

## 2019-05-06 NOTE — Progress Notes (Signed)
Virtual Visit via Telephone Note  I connected with Brandon Oliver on 05/06/19 at  2:00 PM EDT by telephone and verified that I am speaking with the correct person using two identifiers.   I discussed the limitations, risks, security and privacy concerns of performing an evaluation and management service by telephone and the availability of in person appointments. I also discussed with the patient that there may be a patient responsible charge related to this service. The patient expressed understanding and agreed to proceed.   History of Present Illness: Patient was evaluated through phone session.  He has been stable on his medication.  He sleeps good and denies any irritability, anger or any mood swing.  He feels the current medicine keeping him calm and he does not get upset or agitated.  He continues to help his friend who goes to dialysis.  He admitted some sadness after the loss of his brother last year in [place].  He tried to keep medicated with other siblings but due to [name] it has been difficult.  He like to go to [place] to visit family member but due to [place] not able to make the plan.  However he tried to call few times to his nephew.  He denies any paranoia, hallucination, tremors or shakes.  His appetite is okay.  His energy level is good.  He is very reluctant to cut down the dose of the medication since he feel it is working very well.   Past Psychiatric History: H/O inpatient in 2005 due to psychosis and paranoia at behavioral [place].  Psychiatric Specialty Exam: Physical Exam  ROS  There were no vitals taken for this visit.There is no height or weight on file to calculate BMI.  General Appearance: NA  Eye Contact:  NA  Speech:  Clear and Coherent, Normal Rate and Slow  Volume:  Normal  Mood:  Euthymic  Affect:  NA  Thought Process:  Goal Directed  Orientation:  Full (Time, Place, and Person)  Thought Content:  WDL and Logical  Suicidal Thoughts:  No   Homicidal Thoughts:  No  Memory:  Immediate;   Good Recent;   Good Remote;   Good  Judgement:  Good  Insight:  Good  Psychomotor Activity:  Normal  Concentration:  Concentration: Fair and Attention Span: Fair  Recall:  Good  Fund of Knowledge:  Good  Language:  Good  Akathisia:  No  Handed:  Right  AIMS (if indicated):     Assets:  Communication Skills Desire for Improvement Housing Resilience Social Support Talents/Skills  ADL's:  Intact  Cognition:  WNL  Sleep:   ok      Assessment and Plan: Schizoaffective disorder, bipolar type.  Patient is a stable on his medication.  He is taking Risperdal 2 mg at bedtime and Zoloft 100 mg daily.  He has no tremors, shakes or any EPS.  We have discussed in the past lowering his Risperdal but he is very reluctant to cut down the dose.  He is not interested in therapy.  Discussed medication side effects and benefits.  Recommended to call us back if is any question or any concern.  Follow-up in 6 months.  Follow Up Instructions:    I discussed the assessment and treatment plan with the patient. The patient was provided an opportunity to ask questions and all were answered. The patient agreed with the plan and demonstrated an understanding of the instructions.   The patient was advised to  call back or seek an in-person evaluation if the symptoms worsen or if the condition fails to improve as anticipated.  I provided 20 minutes of non-face-to-face time during this encounter.   [name], MD

## 2019-05-26 NOTE — Progress Notes (Signed)
Cardiology Office Note:    Date:  05/27/2019   ID:  NAVY MOEBIUS, DOB 1946/01/23, MRN [id number]  PCP:  [name], MD  Cardiologist:  [name], MD   Referring MD: [name], MD   Chief Complaint  Patient presents with  . Coronary Artery Disease    History of Present Illness:    Brandon Oliver is a 73 y.o. male with a hx of CAD with coronary stents after lytic therapy at [facility] in [place], history of bare metal stent circumflex 1998, hyperlipidemia, and schizoaffective disorder.   Willmer is doing okay.  He has not had angina, excessive dyspnea, palpitations, edema, orthopnea, or PND.  He is very sedentary.  This is been worse because of the COVID-19 pandemic.  He denies discomfort in his legs with physical activity.  Past Medical History:  Diagnosis Date  . Cataracts, both eyes    none mature  . History of kidney stones    x3 episodes  . Hyperlipidemia   . Myocardial infarction [facility])    coronary stents x2 at that time.  Marland Kitchen Retina disorder    "floaters" ? tear- Dr. Zigmund Daniel follows  . Schizoaffective disorder, bipolar type ([place])    sees MD every 3- 6 months- controlled.    Past Surgical History:  Procedure Laterality Date  . CARDIAC CATHETERIZATION     stents x2- Dr. Linard Millers follows  . COLONOSCOPY WITH PROPOFOL N/A 02/07/2015   Procedure: COLONOSCOPY WITH PROPOFOL;  Surgeon: Garlan Fair, MD;  Location: [facility];  Service: Endoscopy;  Laterality: N/A;  . HEMORROIDECTOMY      Current Medications: Current Meds  Medication Sig  . aspirin 81 MG tablet Take 81 mg by mouth daily.  . carvedilol (COREG) 3.125 MG tablet Take 3.125 mg by mouth 2 (two) times daily with a meal.   . Coenzyme Q10 (COQ10) 100 MG CAPS Take 100 mg by mouth daily.  . Multiple Vitamin (MULTIVITAMIN) tablet Take 1 tablet by mouth daily.  . Omega-3 Fatty Acids (FISH OIL) 1000 MG CAPS Take 1,000 mg by mouth daily.   . risperiDONE  (RISPERDAL) 2 MG tablet Take 1 tablet (2 mg total) by mouth at bedtime.  . rosuvastatin (CRESTOR) 20 MG tablet Take 20 mg by mouth daily.  . sertraline (ZOLOFT) 100 MG tablet Take 1 tablet (100 mg total) by mouth daily.     Allergies:   Penicillins and Benadryl [diphenhydramine hcl]   Social History   Socioeconomic History  . Marital status: Single    Spouse name: Not on file  . Number of children: Not on file  . Years of education: Not on file  . Highest education level: Not on file  Occupational History  . Not on file  Social Needs  . Financial resource strain: Not on file  . Food insecurity    Worry: Not on file    Inability: Not on file  . Transportation needs    Medical: Not on file    Non-medical: Not on file  Tobacco Use  . Smoking status: Never Smoker  . Smokeless tobacco: Never Used  Substance and Sexual Activity  . Alcohol use: No    Alcohol/week: 0.0 standard drinks  . Drug use: No  . Sexual activity: Not Currently  Lifestyle  . Physical activity    Days per week: Not on file    Minutes per session: Not on file  . Stress: Not on file  Relationships  . Social Herbalist on phone: Not on file    Gets together: Not on file    Attends religious service: Not on file    Active member of club or organization: Not on file    Attends meetings of clubs or organizations: Not on file    Relationship status: Not on file  Other Topics Concern  . Not on file  Social History Narrative  . Not on file     Family History: The patient's family history includes Cancer in his father, maternal grandmother, mother, paternal aunt, paternal grandfather, paternal uncle, and paternal uncle; Diabetes in his maternal aunt.  ROS:   Please see the history of present illness.    Denies orthopnea, PND, stroke, headache.  All other systems reviewed and are negative.  EKGs/Labs/Other Studies Reviewed:    The following studies were reviewed today: No new data  EKG:   EKG normal sinus rhythm with heart rate 84, lead V2 and V3 were interposed.  Small inferior Q waves are noted.  And the tracing is unchanged when compared to October 2019.  Recent Labs: No results found for requested labs within last 8760 hours.  Recent Lipid Panel No results found for: CHOL, TRIG, HDL, CHOLHDL, VLDL, LDLCALC, LDLDIRECT  Physical Exam:    VS:  BP (!) 142/70   Pulse 84   Ht 5' 8.75" (1.746 m)   Wt 201 lb (91.2 kg)   SpO2 97%   BMI 29.90 kg/m     Wt Readings from Last 3 Encounters:  05/27/19 201 lb (91.2 kg)  05/04/18 187 lb 1.9 oz (84.9 kg)  03/18/17 189 lb 12.8 oz (86.1 kg)     GEN: Moderate obesity. No acute distress HEENT: Normal NECK: No JVD. LYMPHATICS: No lymphadenopathy CARDIAC:  RRR without murmur, gallop, or edema. VASCULAR:  Normal Pulses. No bruits. RESPIRATORY:  Clear to auscultation without rales, wheezing or rhonchi  ABDOMEN: Soft, non-tender, non-distended, No pulsatile mass, MUSCULOSKELETAL: No deformity  SKIN: Warm and dry NEUROLOGIC:  Alert and oriented x 3 PSYCHIATRIC:  Normal affect   ASSESSMENT:    1. Atherosclerosis of coronary artery of native heart with stable angina pectoris, unspecified vessel or lesion type ([place])   2. Mixed hyperlipidemia   3. Educated about COVID-19 virus infection    PLAN:    In order of problems listed above:  1. Secondary prevention is discussed.  Needs 150 minutes of moderate activity per week. 2. LDL target is being achieved. 3. Target blood pressure 130/80 mmHg or less.  Little elevated today.  Low-salt diet and physical activity is encouraged.  May need to uptitrate medications. 4. He is observing the 3 WS.  Overall education and awareness concerning primary/secondary risk prevention was discussed in detail: LDL less than 70, hemoglobin A1c less than 7, blood pressure target less than 130/80 mmHg, >150 minutes of moderate aerobic activity per week, avoidance of smoking, weight control (via diet and  exercise), and continued surveillance/management of/for obstructive sleep apnea.    Medication Adjustments/Labs and Tests Ordered: Current medicines are reviewed at length with the patient today.  Concerns regarding medicines are outlined above.  Orders Placed This Encounter  Procedures  . EKG 12-Lead   No orders of the defined types were placed in this encounter.   Patient Instructions  Medication Instructions:  Your physician recommends that you continue on your current medications as directed. Please refer to the Current Medication list given to you today.  *If you  need a refill on your cardiac medications before your next appointment, please call your pharmacy*  Lab Work: None If you have labs (blood work) drawn today and your tests are completely normal, you will receive your results only by: Marland Kitchen MyChart Message (if you have MyChart) OR . A paper copy in the mail If you have any lab test that is abnormal or we need to change your treatment, we will call you to review the results.  Testing/Procedures: None  Follow-Up: At [facility], you and your health needs are our priority.  As part of our continuing mission to provide you with exceptional heart care, we have created designated Provider Care Teams.  These Care Teams include your primary Cardiologist (physician) and Advanced Practice Providers (APPs -  Physician Assistants and Nurse Practitioners) who all work together to provide you with the care you need, when you need it.  Your next appointment:   12 months  The format for your next appointment:   In Person  Provider:   You may see [name], MD or one of the following Advanced Practice Providers on your designated Care Team:    Truitt Merle, NP  Cecilie Kicks, NP  Kathyrn Drown, NP   Other Instructions  Your provider recommends that you maintain 150 minutes per week of moderate aerobic activity.     Signed, [name], MD  05/27/2019  1:48 PM    [facility]

## 2019-05-27 ENCOUNTER — Encounter: Payer: Self-pay | Admitting: Interventional Cardiology

## 2019-05-27 ENCOUNTER — Ambulatory Visit: Payer: Medicare Other | Admitting: Interventional Cardiology

## 2019-05-27 ENCOUNTER — Other Ambulatory Visit: Payer: Self-pay

## 2019-05-27 VITALS — BP 142/70 | HR 84 | Ht 68.75 in | Wt 201.0 lb

## 2019-05-27 DIAGNOSIS — E782 Mixed hyperlipidemia: Secondary | ICD-10-CM | POA: Diagnosis not present

## 2019-05-27 DIAGNOSIS — Z7189 Other specified counseling: Secondary | ICD-10-CM | POA: Diagnosis not present

## 2019-05-27 DIAGNOSIS — I25118 Atherosclerotic heart disease of native coronary artery with other forms of angina pectoris: Secondary | ICD-10-CM | POA: Diagnosis not present

## 2019-05-27 NOTE — Patient Instructions (Signed)
Medication Instructions:  Your physician recommends that you continue on your current medications as directed. Please refer to the Current Medication list given to you today.  *If you need a refill on your cardiac medications before your next appointment, please call your pharmacy*  Lab Work: None If you have labs (blood work) drawn today and your tests are completely normal, you will receive your results only by: Marland Kitchen MyChart Message (if you have MyChart) OR . A paper copy in the mail If you have any lab test that is abnormal or we need to change your treatment, we will call you to review the results.  Testing/Procedures: None  Follow-Up: At [facility], you and your health needs are our priority.  As part of our continuing mission to provide you with exceptional heart care, we have created designated Provider Care Teams.  These Care Teams include your primary Cardiologist (physician) and Advanced Practice Providers (APPs -  Physician Assistants and Nurse Practitioners) who all work together to provide you with the care you need, when you need it.  Your next appointment:   12 months  The format for your next appointment:   In Person  Provider:   You may see [name], MD or one of the following Advanced Practice Providers on your designated Care Team:    Truitt Merle, NP  Cecilie Kicks, NP  Kathyrn Drown, NP   Other Instructions  Your provider recommends that you maintain 150 minutes per week of moderate aerobic activity.

## 2019-06-15 ENCOUNTER — Encounter: Payer: Self-pay | Admitting: Podiatry

## 2019-06-15 ENCOUNTER — Ambulatory Visit: Payer: Medicare Other | Admitting: Podiatry

## 2019-06-15 ENCOUNTER — Other Ambulatory Visit: Payer: Self-pay

## 2019-06-15 DIAGNOSIS — M79676 Pain in unspecified toe(s): Secondary | ICD-10-CM | POA: Diagnosis not present

## 2019-06-15 DIAGNOSIS — B351 Tinea unguium: Secondary | ICD-10-CM

## 2019-06-15 NOTE — Progress Notes (Signed)
Complaint:  Visit Type: Patient returns to my office for continued preventative foot care services. Complaint: Patient states" my nails have grown long and thick and become painful to walk and wear shoes" . The patient presents for preventative foot care services. No changes to ROS  Podiatric Exam: Vascular: dorsalis pedis and posterior tibial pulses are palpable bilateral. Capillary return is immediate. Temperature gradient is WNL. Skin turgor WNL  Sensorium: Normal Semmes Weinstein monofilament test. Normal tactile sensation bilaterally. Nail Exam: Pt has thick disfigured discolored nails with subungual debris noted bilateral entire nail hallux through fifth toenails Ulcer Exam: There is no evidence of ulcer or pre-ulcerative changes or infection. Orthopedic Exam: Muscle tone and strength are WNL. No limitations in general ROM. No crepitus or effusions noted. Foot type and digits show no abnormalities. Bony prominences are unremarkable. Skin: No Porokeratosis. No infection or ulcers  Diagnosis:  Onychomycosis, , Pain in right toe, pain in left toes  Treatment & Plan Procedures and Treatment: Consent by patient was obtained for treatment procedures.   Debridement of mycotic and hypertrophic toenails, 1 through 5 bilateral and clearing of subungual debris. No ulceration, no infection noted.  Return Visit-Office Procedure: Patient instructed to return to the office for a follow up visit 3 months for continued evaluation and treatment.    Alonna Bartling DPM 

## 2019-08-05 ENCOUNTER — Other Ambulatory Visit: Payer: Self-pay

## 2019-08-05 ENCOUNTER — Ambulatory Visit (INDEPENDENT_AMBULATORY_CARE_PROVIDER_SITE_OTHER): Payer: Medicare Other | Admitting: Psychiatry

## 2019-08-05 ENCOUNTER — Encounter (HOSPITAL_COMMUNITY): Payer: Self-pay | Admitting: Psychiatry

## 2019-08-05 DIAGNOSIS — F250 Schizoaffective disorder, bipolar type: Secondary | ICD-10-CM

## 2019-08-05 DIAGNOSIS — F4321 Adjustment disorder with depressed mood: Secondary | ICD-10-CM | POA: Diagnosis not present

## 2019-08-05 MED ORDER — SERTRALINE HCL 100 MG PO TABS: 100.0000 mg | ORAL_TABLET | Freq: Every day | ORAL | 0 refills | Status: DC

## 2019-08-05 MED ORDER — RISPERIDONE 2 MG PO TABS: 2.0000 mg | ORAL_TABLET | Freq: Every day | ORAL | 0 refills | Status: DC

## 2019-08-05 NOTE — Progress Notes (Signed)
Virtual Visit via Telephone Note  I connected with Brandon Oliver on 08/05/19 at 11:20 AM EST by telephone and verified that I am speaking with the correct person using two identifiers.   I discussed the limitations, risks, security and privacy concerns of performing an evaluation and management service by telephone and the availability of in person appointments. I also discussed with the patient that there may be a patient responsible charge related to this service. The patient expressed understanding and agreed to proceed.   History of Present Illness: Patient was evaluated through phone session.  He wanted to get early appointment because his friend [name] died few weeks ago.  Patient saw her in the morning bed when she could not open the door.  Patient told she was bleed to death from fistula.  Patient was helping that male friend to bring her for dialysis.  Patient admitted going through grief at some time she is here her voices but she is also pleased that he feels she is in a good place.  The last time she here her 2 weeks ago and she saw her in his dream that she is smiling.  She is pleased that she is in a better place.  Patient has a support from his sister, his male friend [name] son and few friends from church.  They usually call him on a regular basis and once a week [name] Son bring [place] for him.  Patient does not feel that he need any grief counseling but he agreed that if he noticed symptoms getting worse then he will call us immediately.  He also does not want to change medication because he is sleeping good most of the time.  He admitted some time ruminative thoughts about her but denies any paranoia, hallucination, suicidal thoughts or homicidal thoughts.  He is eating good food his energy level is good.  He tried to keep himself busy by talking to his social network.  Denies drinking or using any illegal substances.  He has no tremors or shakes.   Past Psychiatric  History: H/O inpatientin 2005due topsychosis and paranoia at behavioral [place].  Psychiatric Specialty Exam: Physical Exam  Review of Systems  There were no vitals taken for this visit.There is no height or weight on file to calculate BMI.  General Appearance: NA  Eye Contact:  NA  Speech:  Clear and Coherent and Normal Rate  Volume:  Normal  Mood:  Dysphoric  Affect:  NA  Thought Process:  Goal Directed  Orientation:  Full (Time, Place, and Person)  Thought Content:  Rumination  Suicidal Thoughts:  No  Homicidal Thoughts:  No  Memory:  Immediate;   Good Recent;   Good Remote;   Good  Judgement:  Good  Insight:  Good  Psychomotor Activity:  NA  Concentration:  Concentration: Fair and Attention Span: Fair  Recall:  Good  Fund of Knowledge:  Good  Language:  Good  Akathisia:  No  Handed:  Right  AIMS (if indicated):     Assets:  Communication Skills Desire for Improvement Housing Resilience Social Support  ADL's:  Intact  Cognition:  WNL  Sleep:   ok      Assessment and Plan: Schizoaffective disorder, bipolar type.  Grief.   Follow Up Instructions: Discuss the recent death of his male friend [name].  He admitted grief but does not feel he need any counseling.  He does not want to change her medication.  He is sleeping good.  I discussed safety concern that anytime he feels that he is having any suicidal thoughts, hallucination or paranoia then he should call us immediately.  He agreed with the plan.  We will continue Resporal 2 mg at bedtime and Zoloft 1 mg daily.  Follow-up in 3 months.   I discussed the assessment and treatment plan with the patient. The patient was provided an opportunity to ask questions and all were answered. The patient agreed with the plan and demonstrated an understanding of the instructions.   The patient was advised to call back or seek an in-person evaluation if the symptoms worsen or if the condition fails to improve as  anticipated.  I provided 15 minutes of non-face-to-face time during this encounter.   [name], MD

## 2019-10-14 ENCOUNTER — Encounter (INDEPENDENT_AMBULATORY_CARE_PROVIDER_SITE_OTHER): Payer: Medicare Other | Admitting: Ophthalmology

## 2019-10-19 ENCOUNTER — Encounter (INDEPENDENT_AMBULATORY_CARE_PROVIDER_SITE_OTHER): Payer: Medicare Other | Admitting: Ophthalmology

## 2019-10-19 DIAGNOSIS — H43813 Vitreous degeneration, bilateral: Secondary | ICD-10-CM | POA: Diagnosis not present

## 2019-10-19 DIAGNOSIS — H33301 Unspecified retinal break, right eye: Secondary | ICD-10-CM

## 2019-10-19 DIAGNOSIS — D3132 Benign neoplasm of left choroid: Secondary | ICD-10-CM

## 2019-11-03 ENCOUNTER — Ambulatory Visit (INDEPENDENT_AMBULATORY_CARE_PROVIDER_SITE_OTHER): Payer: Medicare Other | Admitting: Psychiatry

## 2019-11-03 ENCOUNTER — Other Ambulatory Visit: Payer: Self-pay

## 2019-11-03 ENCOUNTER — Encounter (HOSPITAL_COMMUNITY): Payer: Self-pay | Admitting: Psychiatry

## 2019-11-03 VITALS — Wt 196.0 lb

## 2019-11-03 DIAGNOSIS — F250 Schizoaffective disorder, bipolar type: Secondary | ICD-10-CM | POA: Diagnosis not present

## 2019-11-03 DIAGNOSIS — F419 Anxiety disorder, unspecified: Secondary | ICD-10-CM

## 2019-11-03 MED ORDER — SERTRALINE HCL 100 MG PO TABS: 100.0000 mg | ORAL_TABLET | Freq: Every day | ORAL | 0 refills | Status: DC

## 2019-11-03 MED ORDER — RISPERIDONE 1 MG PO TABS: 1.0000 mg | ORAL_TABLET | Freq: Every day | ORAL | 0 refills | Status: DC

## 2019-11-03 NOTE — Progress Notes (Signed)
Virtual Visit via Telephone Note  I connected with Brandon Oliver on 11/03/19 at 11:40 AM EDT by telephone and verified that I am speaking with the correct person using two identifiers.   I discussed the limitations, risks, security and privacy concerns of performing an evaluation and management service by telephone and the availability of in person appointments. I also discussed with the patient that there may be a patient responsible charge related to this service. The patient expressed understanding and agreed to proceed.   History of Present Illness: Patient was evaluated by phone session.  He has been doing better.  He is still missed his friend [name] who died 3 months ago.  However he feels that she is in better place.  He is no longer here her voice.  However he is anxious about his brother-in-law who had massive heart attack and right now he is hospitalized.  He is hoping to go to [place] to visit him this weekend.  Patient told his deceased friend [name]'s son going to [place] and he will go with him.  Overall he feels things are going okay.  He denies any irritability, paranoia, hallucination or any anxiety attack.  He sleeps good.  He recently had blood work and physical and no new medicine added.  His weight is stable.  He has no tremors or shakes.  He tried to keep himself busy by talking to his social network which includes people from the church.   Past Psychiatric History: H/O inpatientin 2005due topsychosis and paranoia at behavioral [place].    Psychiatric Specialty Exam: Physical Exam  Review of Systems  Weight 196 lb (88.9 kg).There is no height or weight on file to calculate BMI.  General Appearance: NA  Eye Contact:  NA  Speech:  Clear and Coherent and Normal Rate  Volume:  Normal  Mood:  Euthymic  Affect:  NA  Thought Process:  Goal Directed  Orientation:  Full (Time, Place, and Person)  Thought Content:  WDL  Suicidal Thoughts:  No   Homicidal Thoughts:  No  Memory:  Immediate;   Good Recent;   Good Remote;   Good  Judgement:  Good  Insight:  Good  Psychomotor Activity:  NA  Concentration:  Concentration: Good and Attention Span: Good  Recall:  Good  Fund of Knowledge:  Good  Language:  Good  Akathisia:  No  Handed:  Right  AIMS (if indicated):     Assets:  Communication Skills Desire for Improvement Housing Resilience  ADL's:  Intact  Cognition:  WNL  Sleep:   good      Assessment and Plan: Schizoaffective disorder, bipolar type.  Anxiety.  Patient has been stable for a while on the current medication.  We discussed try to reduce Risperdal since lately he has no paranoia or hallucination.  After some discussion we agreed to give a trial.  We will try Risperdal 1 mg at bedtime and he will continue Zoloft 100 mg daily.  I reminded that if he started to have anxiety, paranoia or hallucinations then he should call us immediately.  He agreed with the plan.  Follow-up in 3 months.  Follow Up Instructions:    I discussed the assessment and treatment plan with the patient. The patient was provided an opportunity to ask questions and all were answered. The patient agreed with the plan and demonstrated an understanding of the instructions.   The patient was advised to call back or seek an in-person evaluation  if the symptoms worsen or if the condition fails to improve as anticipated.  I provided 20 minutes of non-face-to-face time during this encounter.   [name], MD

## 2019-12-14 ENCOUNTER — Encounter: Payer: Self-pay | Admitting: Podiatry

## 2019-12-14 ENCOUNTER — Ambulatory Visit: Payer: Medicare Other | Admitting: Podiatry

## 2019-12-14 ENCOUNTER — Other Ambulatory Visit: Payer: Self-pay

## 2019-12-14 VITALS — Temp 97.6°F

## 2019-12-14 DIAGNOSIS — M79676 Pain in unspecified toe(s): Secondary | ICD-10-CM

## 2019-12-14 DIAGNOSIS — B351 Tinea unguium: Secondary | ICD-10-CM | POA: Diagnosis not present

## 2019-12-14 NOTE — Progress Notes (Signed)

## 2020-01-27 ENCOUNTER — Other Ambulatory Visit (HOSPITAL_COMMUNITY): Payer: Self-pay | Admitting: Psychiatry

## 2020-01-27 DIAGNOSIS — F250 Schizoaffective disorder, bipolar type: Secondary | ICD-10-CM

## 2020-02-02 ENCOUNTER — Ambulatory Visit (HOSPITAL_COMMUNITY): Payer: Medicare Other | Admitting: Psychiatry

## 2020-02-03 ENCOUNTER — Encounter (HOSPITAL_COMMUNITY): Payer: Self-pay | Admitting: Psychiatry

## 2020-02-03 ENCOUNTER — Other Ambulatory Visit: Payer: Self-pay

## 2020-02-03 ENCOUNTER — Telehealth (INDEPENDENT_AMBULATORY_CARE_PROVIDER_SITE_OTHER): Payer: Medicare Other | Admitting: Psychiatry

## 2020-02-03 DIAGNOSIS — F419 Anxiety disorder, unspecified: Secondary | ICD-10-CM | POA: Diagnosis not present

## 2020-02-03 DIAGNOSIS — F250 Schizoaffective disorder, bipolar type: Secondary | ICD-10-CM

## 2020-02-03 MED ORDER — SERTRALINE HCL 100 MG PO TABS: 100.0000 mg | ORAL_TABLET | Freq: Every day | ORAL | 0 refills | Status: DC

## 2020-02-03 MED ORDER — RISPERIDONE 1 MG PO TABS: 1.0000 mg | ORAL_TABLET | Freq: Every day | ORAL | 0 refills | Status: DC

## 2020-02-03 NOTE — Progress Notes (Signed)
Virtual Visit via Telephone Note  I connected with Brandon Oliver on 02/03/20 at  1:40 PM EDT by telephone and verified that I am speaking with the correct person using two identifiers.  Location: Patient: home Provider: home office   I discussed the limitations, risks, security and privacy concerns of performing an evaluation and management service by telephone and the availability of in person appointments. I also discussed with the patient that there may be a patient responsible charge related to this service. The patient expressed understanding and agreed to proceed.   History of Present Illness: Patient is evaluated by phone session.  He is stable on his medication.  He continues to keep himself busy with church activities and socializing his friends.  He denies any paranoia, anger, mood swings or any irritability.  He denies any hallucination.  His appetite is okay.  His energy level is good.  He has no tremors shakes or any EPS.  He wants to keep his current medication.  Past Psychiatric History: H/O inpatientin 2005due topsychosis and paranoia at behavioral [place].   Psychiatric Specialty Exam: Physical Exam  Review of Systems  Weight 196 lb (88.9 kg).There is no height or weight on file to calculate BMI.  General Appearance: NA  Eye Contact:  NA  Speech:  Clear and Coherent and Slow  Volume:  Normal  Mood:  Euthymic  Affect:  NA  Thought Process:  Goal Directed  Orientation:  Full (Time, Place, and Person)  Thought Content:  WDL and Logical  Suicidal Thoughts:  No  Homicidal Thoughts:  No  Memory:  Immediate;   Good Recent;   Good Remote;   Good  Judgement:  Good  Insight:  Good  Psychomotor Activity:  NA  Concentration:  Concentration: Good and Attention Span: Good  Recall:  Good  Fund of Knowledge:  Good  Language:  Good  Akathisia:  No  Handed:  Right  AIMS (if indicated):     Assets:  Communication Skills Desire for  Improvement Housing Resilience Social Support Transportation  ADL's:  Intact  Cognition:  WNL  Sleep:   ok      Assessment and Plan: Schizoaffective disorder, bipolar type.  Anxiety.  Patient is a stable on his medication.  We did cut down Risperdal from 2 mg to 1 mg and so far he is doing well and does not have worsening of symptoms.  He does not want to cut down further and like to keep the current dose of Risperdal and Zoloft.  I agree with the plan.  We will continue Risperdal 1 mg at bedtime and Zoloft 100 mg daily.  However I encouraged him to think for the future appointments if likely reduce the medicine since he is stable for a long time.  Recommended to call us back if is any question or any concern.  Follow-up in 3 months.  Follow Up Instructions:    I discussed the assessment and treatment plan with the patient. The patient was provided an opportunity to ask questions and all were answered. The patient agreed with the plan and demonstrated an understanding of the instructions.   The patient was advised to call back or seek an in-person evaluation if the symptoms worsen or if the condition fails to improve as anticipated.  I provided 15 minutes of non-face-to-face time during this encounter.   [name], MD

## 2020-04-19 ENCOUNTER — Other Ambulatory Visit: Payer: Self-pay | Admitting: Gastroenterology

## 2020-04-19 DIAGNOSIS — Z800 Family history of malignant neoplasm of digestive organs: Secondary | ICD-10-CM

## 2020-04-27 ENCOUNTER — Other Ambulatory Visit (HOSPITAL_COMMUNITY): Payer: Self-pay | Admitting: Psychiatry

## 2020-04-27 DIAGNOSIS — F250 Schizoaffective disorder, bipolar type: Secondary | ICD-10-CM

## 2020-05-02 ENCOUNTER — Encounter (HOSPITAL_COMMUNITY): Payer: Self-pay | Admitting: Psychiatry

## 2020-05-02 ENCOUNTER — Other Ambulatory Visit: Payer: Self-pay

## 2020-05-02 ENCOUNTER — Telehealth (INDEPENDENT_AMBULATORY_CARE_PROVIDER_SITE_OTHER): Payer: Medicare Other | Admitting: Psychiatry

## 2020-05-02 DIAGNOSIS — F250 Schizoaffective disorder, bipolar type: Secondary | ICD-10-CM

## 2020-05-02 DIAGNOSIS — F419 Anxiety disorder, unspecified: Secondary | ICD-10-CM | POA: Diagnosis not present

## 2020-05-02 MED ORDER — SERTRALINE HCL 100 MG PO TABS: 100.0000 mg | ORAL_TABLET | Freq: Every day | ORAL | 0 refills | Status: DC

## 2020-05-02 MED ORDER — RISPERIDONE 1 MG PO TABS: 1.0000 mg | ORAL_TABLET | Freq: Every day | ORAL | 0 refills | Status: DC

## 2020-05-02 NOTE — Progress Notes (Signed)
Virtual Visit via Telephone Note  I connected with Brandon Oliver on 05/02/20 at  1:40 PM EDT by telephone and verified that I am speaking with the correct person using two identifiers.  Location: Patient: home Provider: home office   I discussed the limitations, risks, security and privacy concerns of performing an evaluation and management service by telephone and the availability of in person appointments. I also discussed with the patient that there may be a patient responsible charge related to this service. The patient expressed understanding and agreed to proceed.   History of Present Illness: Patient is evaluated by phone session.  On the last visit we cut down his Risperdal to 1 mg.  He was afraid that his symptoms come back but he is doing well.  He is sleeping good.  He denies any paranoia, hallucination or any anger.  He continues to keep himself busy with the church activities and tried to visit his friends if possible.  Today he is going to have a booster dose of COVID vaccine and also he got flu vaccine.  He has upcoming appointment to see his GI for colonoscopy.  Patient feels his anxiety is better and he denies any crying spells, feeling of hopelessness or any panic attack.  His energy level is good.  He admitted weight gain in recent months but he is hoping to lose weight since he is more active.  He has no tremors, shakes or any EPS.  Past Psychiatric History: H/O inpatientin 2005due topsychosis and paranoia at [facility].   Psychiatric Specialty Exam: Physical Exam  Review of Systems  Weight 199 lb (90.3 kg).There is no height or weight on file to calculate BMI.  General Appearance: NA  Eye Contact:  NA  Speech:  Clear and Coherent and Slow  Volume:  Normal  Mood:  Euthymic  Affect:  NA  Thought Process:  Goal Directed  Orientation:  Full (Time, Place, and Person)  Thought Content:  Logical  Suicidal Thoughts:  No  Homicidal Thoughts:  No   Memory:  Immediate;   Good Recent;   Good Remote;   Good  Judgement:  Good  Insight:  Present  Psychomotor Activity:  NA  Concentration:  Concentration: Good and Attention Span: Good  Recall:  Good  Fund of Knowledge:  Good  Language:  Good  Akathisia:  No  Handed:  Right  AIMS (if indicated):     Assets:  Communication Skills Desire for Improvement Housing Resilience Social Support Transportation  ADL's:  Intact  Cognition:  WNL  Sleep:   ok      Assessment and Plan: Schizoaffective disorder, bipolar type.  Anxiety.  Patient is a stable on the Risperdal 1 mg.  He did not notice worsening of symptoms.  He is happy that he is taking the Risperdal 1 mg it is working well.  I will continue Zoloft 100 mg daily and Risperdal 1 mg at bedtime.  Recommended to call us back if is any question or any concern.  Follow-up in 3 months.  Follow Up Instructions:    I discussed the assessment and treatment plan with the patient. The patient was provided an opportunity to ask questions and all were answered. The patient agreed with the plan and demonstrated an understanding of the instructions.   The patient was advised to call back or seek an in-person evaluation if the symptoms worsen or if the condition fails to improve as anticipated.  I provided 15 minutes of non-face-to-face  time during this encounter.   [name], MD

## 2020-05-10 ENCOUNTER — Ambulatory Visit
Admission: RE | Admit: 2020-05-10 | Discharge: 2020-05-10 | Disposition: A | Discharge status: Home / Self Care | Payer: Medicare Other | Source: Ambulatory Visit | Attending: Gastroenterology | Admitting: Gastroenterology

## 2020-05-10 ENCOUNTER — Other Ambulatory Visit: Payer: Self-pay

## 2020-05-10 DIAGNOSIS — Z800 Family history of malignant neoplasm of digestive organs: Secondary | ICD-10-CM

## 2020-05-10 IMAGING — MR MR ABDOMEN WO/W CM MRCP
13 of 20 series · 28 of 48 positions shown · IV contrast (multihance)
Comparison: None.

CLINICAL DATA: Pancreatic cancer screening, strong family history
of pancreatic cancer

EXAM:
MRI ABDOMEN WITHOUT AND WITH CONTRAST (INCLUDING MRCP)
TECHNIQUE: Multiplanar multisequence MR imaging of the abdomen was performed
both before and after the administration of intravenous contrast.
Heavily T2-weighted images of the biliary and pancreatic ducts were
obtained, and three-dimensional MRCP images were rendered by post
processing.
CONTRAST:  20mL MULTIHANCE GADOBENATE DIMEGLUMINE 529 MG/ML IV SOLN

[Series 2: cor haste · coronal · 5.0mm · 0.82mm/px · 2 of 39 slices shown]
[im 1/39]
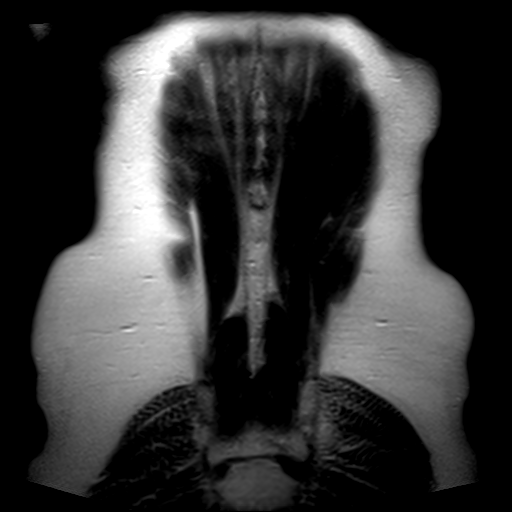
[im 39/39]
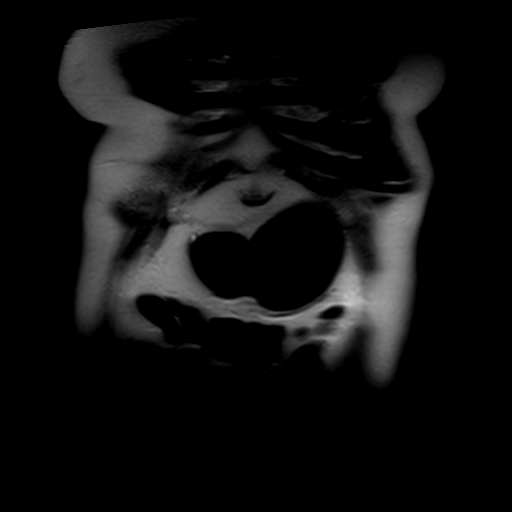

[Series 3: T1 · axial · 6.0mm · 0.88mm/px · z∈[-197,+27]mm · 3 of 70 slices shown]
[im 1/70]
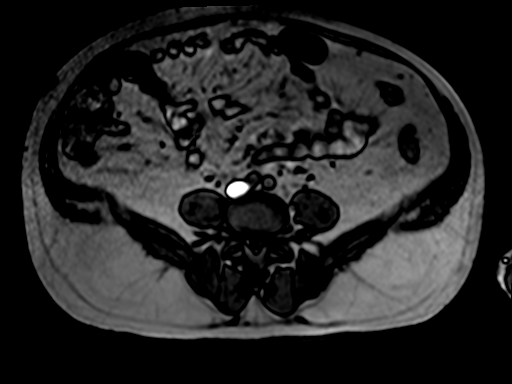
[im 35/70]
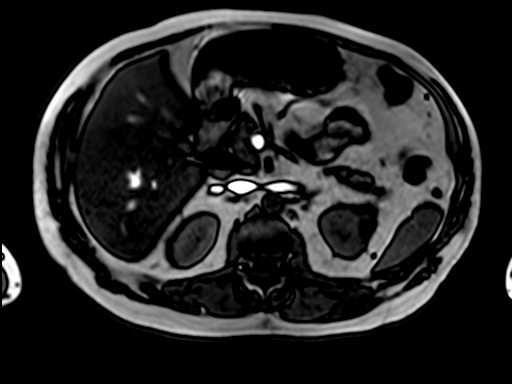
[im 70/70]
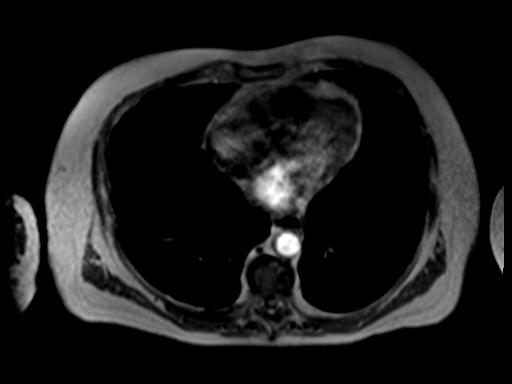

[Series 6: T2 · axial · 6.0mm · 1.41mm/px · 1 of 37 slices shown (1 of 2)]
[im 1/37]
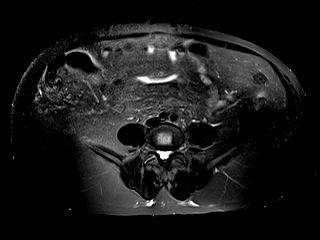

[Series 11: ep2d_diff_b50_500_800_p2_trig · axial · 6.0mm · 2.34mm/px · z∈[-175,+62]mm · 3 of 102 slices shown]
[im 1/102]
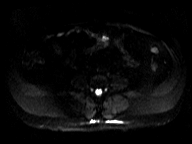
[im 51/102]
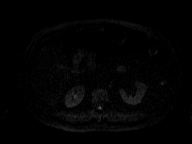
[im 102/102]
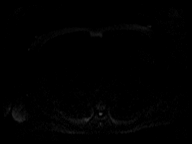

[Series 12: ep2d_[id number]_500_800_p2_trig_adc · axial · 6.0mm · 2.34mm/px · 1 of 34 slices shown]
[im 1/34]
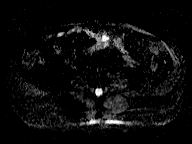

[Series 14: T2 · coronal · 3.0mm · 0.70mm/px · 2 of 48 slices shown (2 of 2)]
[im 1/48]
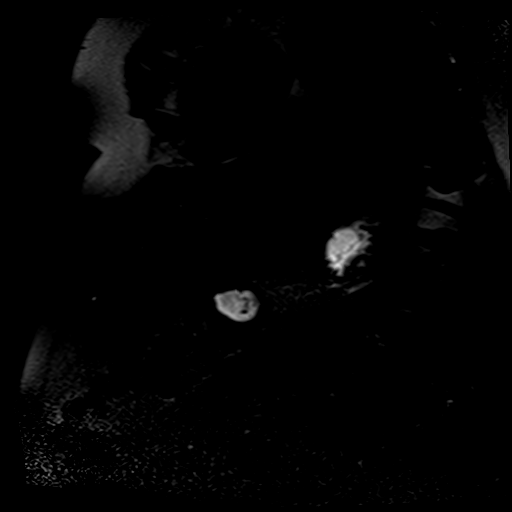
[im 48/48]
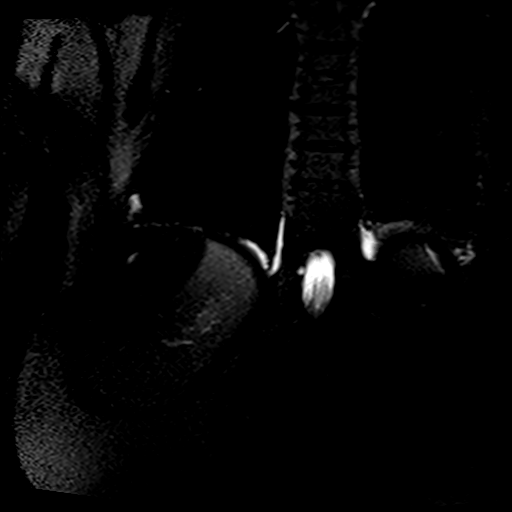

[Series 15: bSSFP · coronal · 5.0mm · 0.88mm/px · 1 of 27 slices shown (1 of 2)]
[im 1/27]
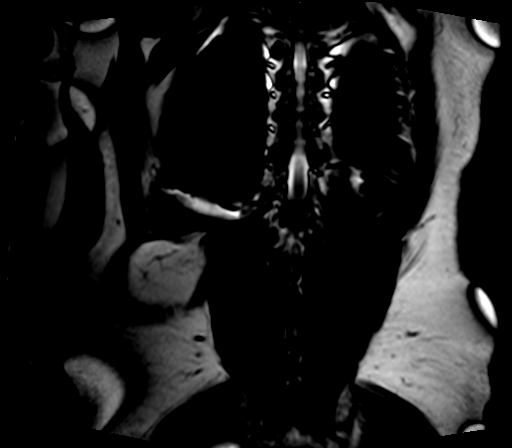

[Series 16: axial haste · axial · 6.0mm · 0.86mm/px · 1 of 40 slices shown]
[im 1/40]
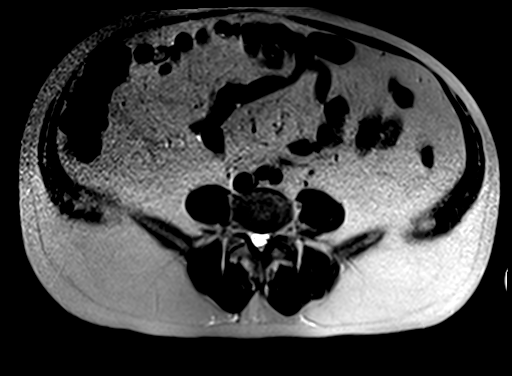

[Series 17: bSSFP · axial · 5.0mm · 0.88mm/px · z∈[-182,+57]mm · 2 of 49 slices shown (2 of 2)]
[im 1/49]
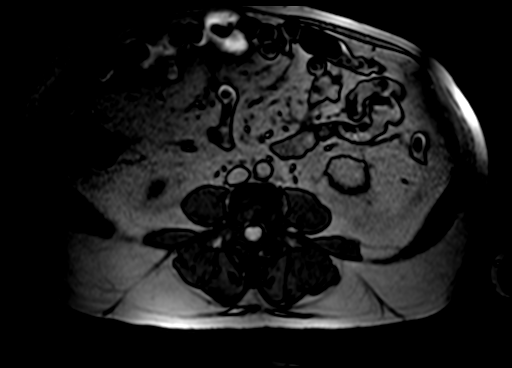
[im 49/49]
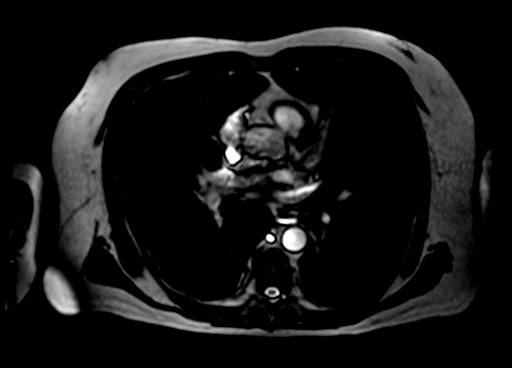

[Series 18: T1 dynamic · axial · non-contrast · 2.5mm · 0.88mm/px · z∈[-185,+51]mm · 3 of 96 slices shown]
[im 1/96]
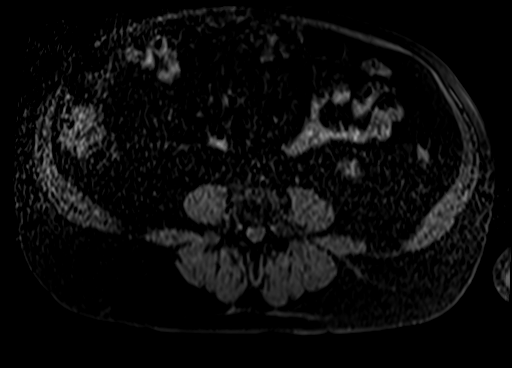
[im 48/96]
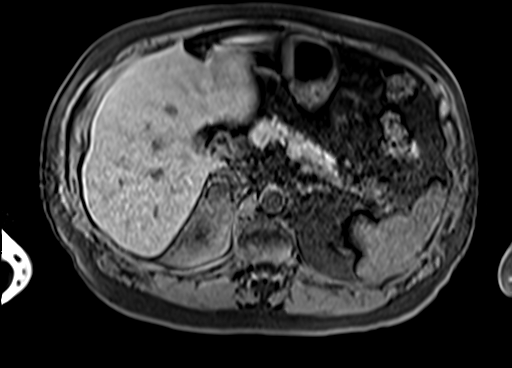
[im 96/96]
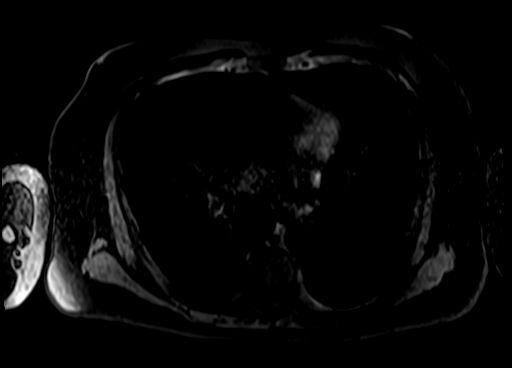

[Series 19: T1 dynamic post-contrast · axial · 2.5mm · 0.88mm/px · z∈[-185,+51]mm · 3 of 96 slices shown (1 of 3)]
[im 1/96]
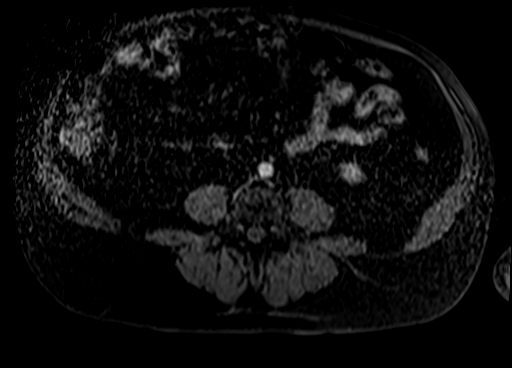
[im 48/96]
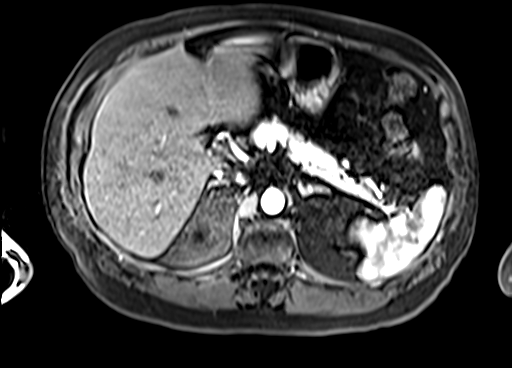
[im 96/96]
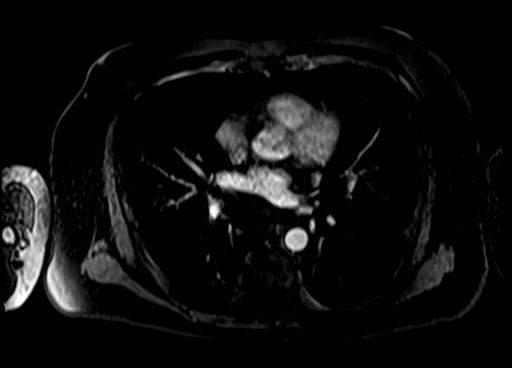

[Series 20: T1 dynamic post-contrast · axial · 2.5mm · 0.88mm/px · z∈[-185,+51]mm · 3 of 96 slices shown (2 of 3)]
[im 1/96]
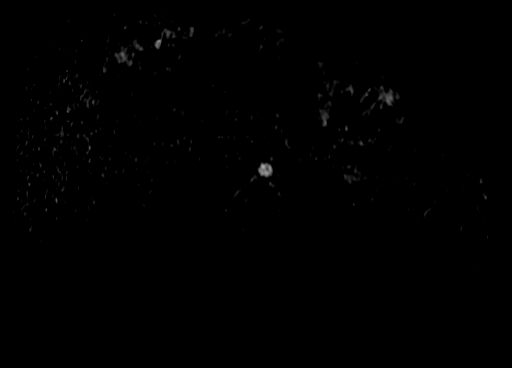
[im 48/96]
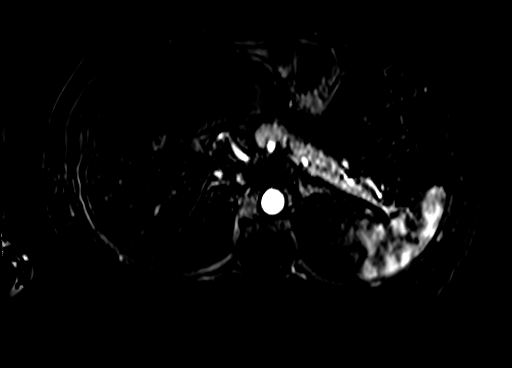
[im 96/96]
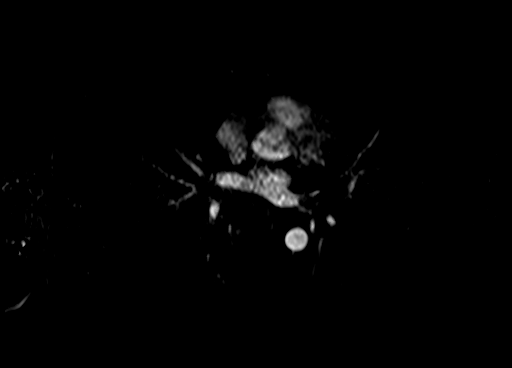

[Series 21: T1 dynamic post-contrast · axial · 2.5mm · 0.88mm/px · z∈[-185,+51]mm · 3 of 96 slices shown (3 of 3)]
[im 1/96]
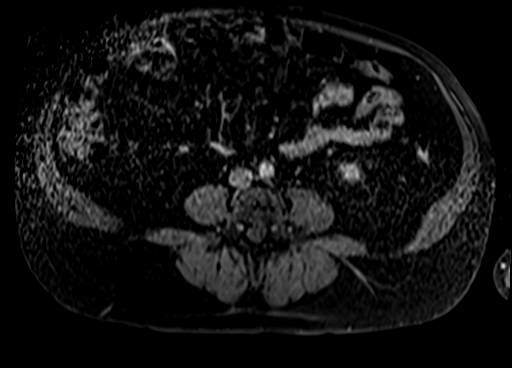
[im 48/96]
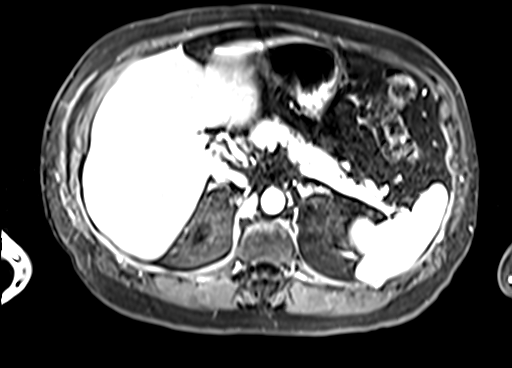
[im 96/96]
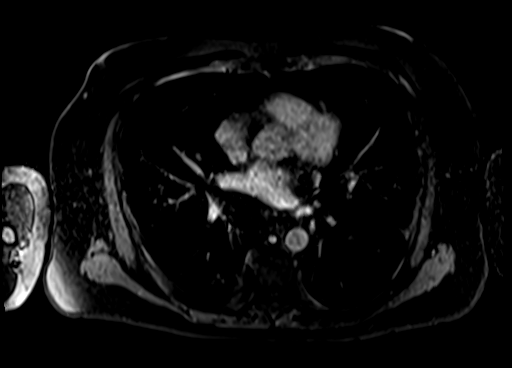

[28 of 48 positions shown; findings below may reference images not displayed]

FINDINGS: Lower chest: Trace bilateral pleural effusions.

Hepatobiliary: Severe hepatic steatosis. No mass or other
parenchymal abnormality identified. No biliary ductal dilatation

Pancreas: No mass, inflammatory changes, or other parenchymal
abnormality identified. No pancreatic ductal dilatation.

Spleen:  Within normal limits in size and appearance.

Adrenals/Urinary Tract: No masses identified. No evidence of
hydronephrosis.

Stomach/Bowel: Incidental diverticulum of the descending duodenum.
Visualized portions within the abdomen are unremarkable.

Vascular/Lymphatic: No pathologically enlarged lymph nodes
identified. No abdominal aortic aneurysm demonstrated.

Other:  None.

Musculoskeletal: No suspicious bone lesions identified.
IMPRESSION: 1. No evidence of pancreatic mass or other abnormality of the
pancreas. No pancreatic ductal dilatation.
2. Severe hepatic steatosis.
3. Trace bilateral pleural effusions in the included lower chest.

## 2020-05-10 MED ORDER — GADOBENATE DIMEGLUMINE 529 MG/ML IV SOLN
20.0000 mL | Freq: Once | INTRAVENOUS | Status: AC | PRN
  Administered 2020-05-10: 20 mL via INTRAVENOUS

## 2020-06-20 ENCOUNTER — Ambulatory Visit: Payer: Medicare Other | Admitting: Podiatry

## 2020-06-20 ENCOUNTER — Other Ambulatory Visit: Payer: Self-pay

## 2020-06-20 ENCOUNTER — Encounter: Payer: Self-pay | Admitting: Podiatry

## 2020-06-20 DIAGNOSIS — B351 Tinea unguium: Secondary | ICD-10-CM

## 2020-06-20 DIAGNOSIS — M79676 Pain in unspecified toe(s): Secondary | ICD-10-CM | POA: Diagnosis not present

## 2020-06-20 NOTE — Progress Notes (Signed)

## 2020-07-19 ENCOUNTER — Encounter: Payer: Self-pay | Admitting: Pulmonary Disease

## 2020-07-19 ENCOUNTER — Ambulatory Visit: Payer: Medicare Other | Admitting: Pulmonary Disease

## 2020-07-19 ENCOUNTER — Other Ambulatory Visit: Payer: Self-pay

## 2020-07-19 VITALS — BP 128/82 | HR 101 | Temp 98.2°F | Resp 18 | Ht 68.0 in | Wt 191.6 lb

## 2020-07-19 DIAGNOSIS — R918 Other nonspecific abnormal finding of lung field: Secondary | ICD-10-CM | POA: Diagnosis not present

## 2020-07-19 NOTE — Progress Notes (Signed)
Brandon Oliver    [id number]    Apr 23, 1946  Primary Care Physician:Ross, Dwyane Luo, MD  Referring Physician: Otis Brace, MD [address],  [place] [address]  Chief complaint: Consult for abnormal chest x-ray  HPI: 74 year old with history of hypertension, MI, schizoaffective disorder Sent here for an abnormal chest x-ray that he got from his primary care.  He was told that he had "fluid, lung nodules and damage to the lungs" on the chest x-ray needed a pulmonary evaluation  Complains of mild dyspnea on exertion.  He is able to exercise and walks up to a mile without any issue every day.  Denies any cough, sputum production, fevers, chills.  He has history of coronary artery disease, heart failure and follows with Dr. 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 Exposures: No known exposures.  No mold, hot tub, Jacuzzi Smoking history: Never smoker Travel history: Originally from [place].  However travel to [place] in October 2021 Relevant family history: No significant family history of lung disease   Outpatient Encounter Medications as of 07/19/2020  Medication Sig  . aspirin 81 MG tablet Take 81 mg by mouth daily.  Marland Kitchen bismuth subsalicylate (PEPTO BISMOL) 262 MG chewable tablet 2 tablet with food today and again in 2 weeks.  . carvedilol (COREG) 3.125 MG tablet Take 3.125 mg by mouth 2 (two) times daily with a meal.   . Coenzyme Q10 (COQ10) 100 MG CAPS Take 100 mg by mouth daily.  . Multiple Vitamin (MULTIVITAMIN) tablet Take 1 tablet by mouth daily.  . Omega-3 Fatty Acids (FISH OIL) 1000 MG CAPS Take 1,000 mg by mouth daily.   . risperiDONE (RISPERDAL) 1 MG tablet Take 1 tablet (1 mg total) by mouth at bedtime.  . rosuvastatin (CRESTOR) 20 MG tablet Take 20 mg by mouth daily.  . sertraline (ZOLOFT) 100 MG tablet Take 1 tablet (100 mg total) by mouth daily.  Marland Kitchen doxycycline (VIBRAMYCIN)  100 MG capsule Take 100 mg by mouth 2 (two) times daily.  . Garlic 123XX123 MG CAPS See admin instructions.  . metroNIDAZOLE (FLAGYL) 500 MG tablet Take 500 mg by mouth 3 (three) times daily.   No facility-administered encounter medications on file as of 07/19/2020.    Allergies as of 07/19/2020 - Review Complete 07/19/2020  Allergen Reaction Noted  . Penicillins Other (See Comments) 05/20/2012  . Benadryl [diphenhydramine hcl] Rash 02/07/2015    Past Medical History:  Diagnosis Date  . Cataracts, both eyes    none mature  . History of kidney stones    x3 episodes  . Hyperlipidemia   . Myocardial infarction [facility])    coronary stents x2 at that time.  Marland Kitchen Retina disorder    "floaters" ? tear- Dr. Zigmund Daniel follows  . Schizoaffective disorder, bipolar type ([place])    sees MD every 3- 6 months- controlled.    Past Surgical History:  Procedure Laterality Date  . CARDIAC CATHETERIZATION     stents x2- Dr. Linard Millers follows  . COLONOSCOPY WITH PROPOFOL N/A 02/07/2015   Procedure: COLONOSCOPY WITH PROPOFOL;  Surgeon: Garlan Fair, MD;  Location: [facility] END[facility]COPY;  Service: Endoscopy;  Laterality: N/A;  . HEMORROIDECTOMY      Family History  Problem Relation Age of Onset  . Cancer Mother        breast  . Cancer Father  colon  . Diabetes Maternal Aunt   . Cancer Paternal Aunt        "male" cancer  . Cancer Paternal Uncle        pancreatic  . Cancer Paternal Grandfather        colon  . Cancer Paternal Uncle        lung  . Cancer Maternal Grandmother        pancreatic    Social History   Socioeconomic History  . Marital status: Single    Spouse name: Not on file  . Number of children: Not on file  . Years of education: Not on file  . Highest education level: Not on file  Occupational History  . Not on file  Tobacco Use  . Smoking status: Never Smoker  . Smokeless tobacco: Never Used  Vaping Use  . Vaping Use: Never used  Substance and Sexual Activity  .  Alcohol use: No    Alcohol/week: 0.0 standard drinks  . Drug use: No  . Sexual activity: Not Currently  Other Topics Concern  . Not on file  Social History Narrative  . Not on file   Social Determinants of Health   Financial Resource Strain: Not on file  Food Insecurity: Not on file  Transportation Needs: Not on file  Physical Activity: Not on file  Stress: Not on file  Social Connections: Not on file  Intimate Partner Violence: Not on file    Review of systems: Review of Systems  Constitutional: Negative for fever and chills.  HENT: Negative.   Eyes: Negative for blurred vision.  Respiratory: as per HPI  Cardiovascular: Negative for chest pain and palpitations.  Gastrointestinal: Negative for vomiting, diarrhea, blood per rectum. Genitourinary: Negative for dysuria, urgency, frequency and hematuria.  Musculoskeletal: Negative for myalgias, back pain and joint pain.  Skin: Negative for itching and rash.  Neurological: Negative for dizziness, tremors, focal weakness, seizures and loss of consciousness.  Endo/Heme/Allergies: Negative for environmental allergies.  Psychiatric/Behavioral: Negative for depression, suicidal ideas and hallucinations.  All other systems reviewed and are negative.  Physical Exam: Blood pressure 128/82, pulse (!) 101, temperature 98.2 F (36.8 C), temperature source Skin, resp. rate 18, height 5\' 8"  (1.727 m), weight 191 lb 9.6 oz (86.9 kg), SpO2 95 %. Gen:      No acute distress HEENT:  EOMI, sclera anicteric Neck:     No masses; no thyromegaly Lungs:    Clear to auscultation bilaterally; normal respiratory effort CV:         Regular rate and rhythm; no murmurs Abd:      + bowel sounds; soft, non-tender; no palpable masses, no distension Ext:    No edema; adequate peripheral perfusion Skin:      Warm and dry; no rash Neuro: alert and oriented x 3 Psych: normal mood and affect  Data Reviewed: Imaging: Chest x-ray from primary care  06/06/2020-pulmonary vascular congestion with bibasal airspace disease/atelectasis.  I have reviewed the images personally.  PFTs:  Labs:  Assessment:  Evaluation for interstitial lung disease, abnormal chest x-ray I have reviewed the chest x-ray which looks more like CHF.  He may have bibasal atelectasis versus ILD but it is unclear on the chest x-ray.  There appears to be a calcified granuloma in the left upper lobe.  Get high-res CT for better evaluation.  Further work-up will be determined after review of CT scan.  Plan/Recommendations: High-resolution CT  Chilton Greathouse MD [facility] 07/19/2020, 11:59  AM  CC: Otis Brace, MD

## 2020-07-19 NOTE — Patient Instructions (Signed)
I have reviewed your chest x-ray which shows opacities I will order a high-resolution CT to get a better look at your lungs  Follow-up in 2 to 4 weeks to review scan

## 2020-07-20 ENCOUNTER — Encounter: Payer: Self-pay | Admitting: Pulmonary Disease

## 2020-07-25 ENCOUNTER — Ambulatory Visit
Admission: RE | Admit: 2020-07-25 | Discharge: 2020-07-25 | Disposition: A | Discharge status: Home / Self Care | Payer: Medicare Other | Source: Ambulatory Visit | Attending: Pulmonary Disease | Admitting: Pulmonary Disease

## 2020-07-25 DIAGNOSIS — R918 Other nonspecific abnormal finding of lung field: Secondary | ICD-10-CM

## 2020-07-25 IMAGING — CT CT CHEST HIGH RESOLUTION W/O CM
2 of 8 series · 14 of 36 positions shown, 17 images · non-contrast
Comparison: No priors.

CLINICAL DATA: 74-year-old male with history of shortness of breath
and cough.

EXAM:
CT CHEST WITHOUT CONTRAST
TECHNIQUE: Multidetector CT imaging of the chest was performed following the
standard protocol without intravenous contrast. High resolution
imaging of the lungs, as well as inspiratory and expiratory imaging,
was performed.

[Series 4: chest 2.00 br36 s3 cor soft · coronal · 0.58mm/px · 3 of 207 slices shown]
[im 42/207  lung]
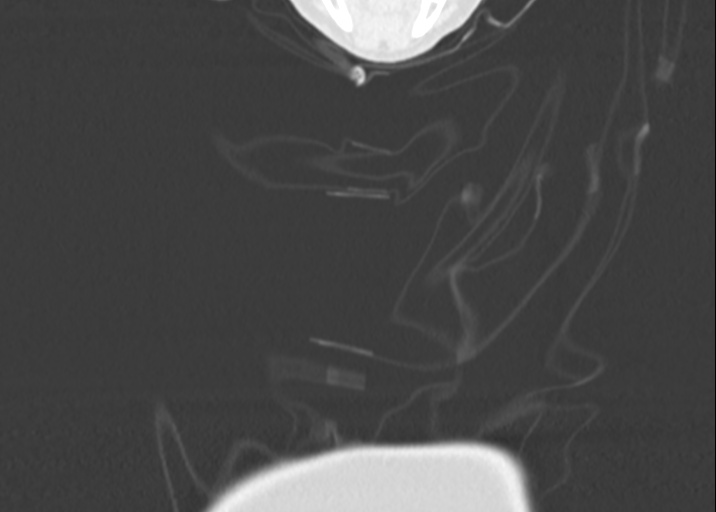
[im 83/207  lung]
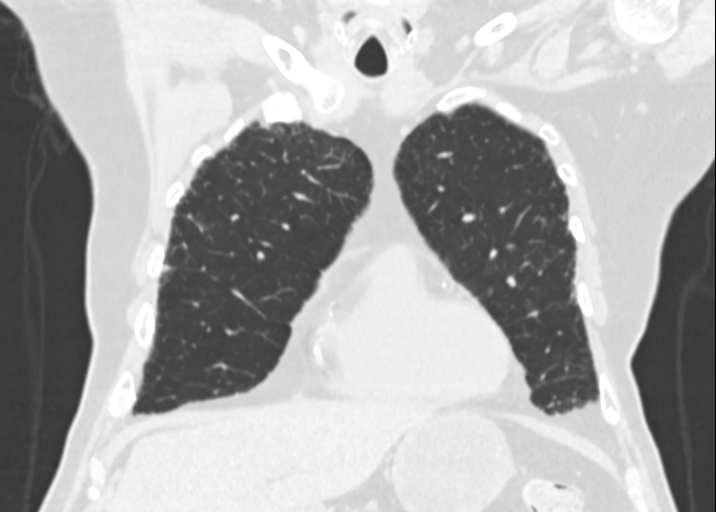
[im 124/207  lung]
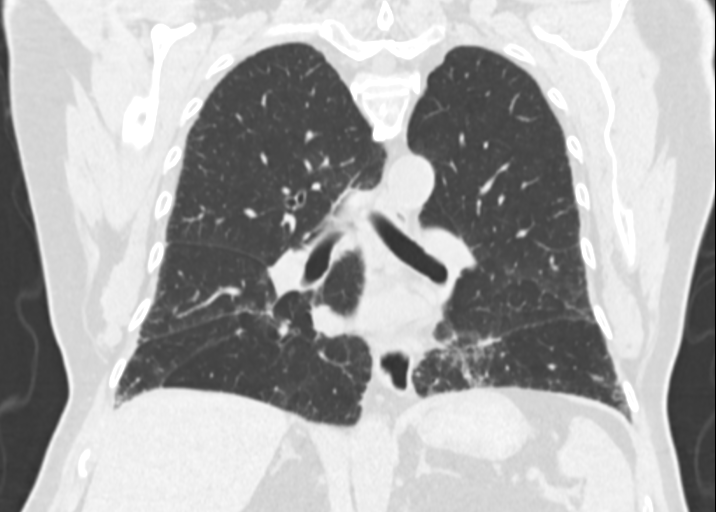

[Series 11: chest 1.00 br60 s3 high res thins 1x1 mm · axial · 0.81mm/px · z∈[+1494,+1740]mm · 11 of 296 slices shown, 14 images]
[im 25/296  mediastinal]
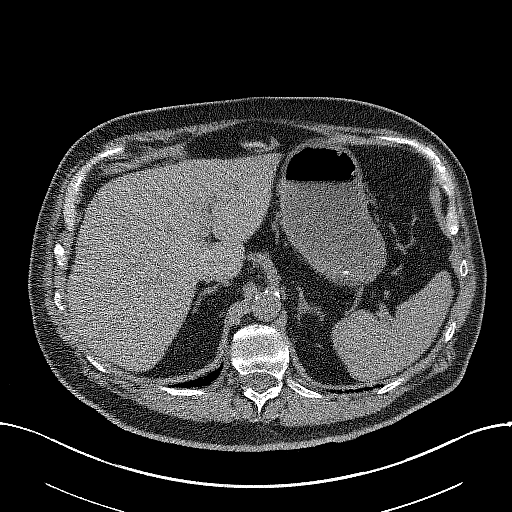
[im 25/296  lung]
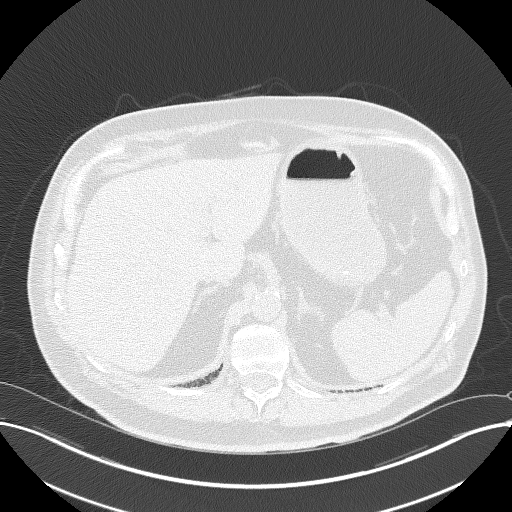
[im 50/296  lung]
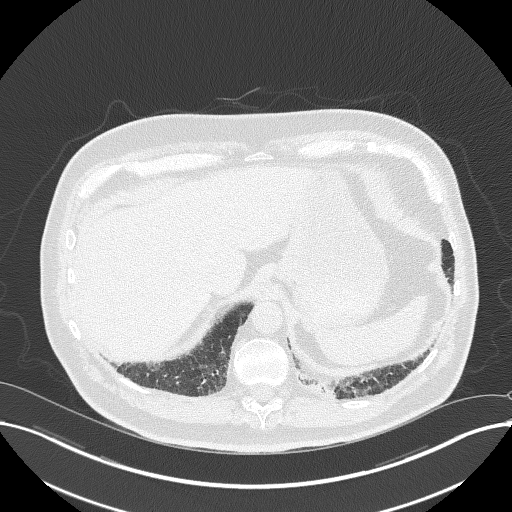
[im 74/296  lung]
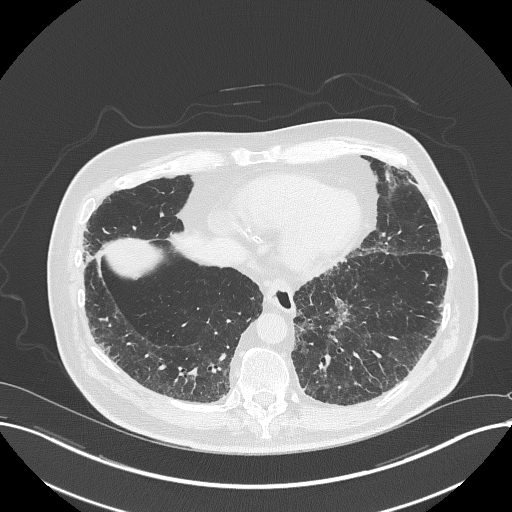
[im 99/296  lung]
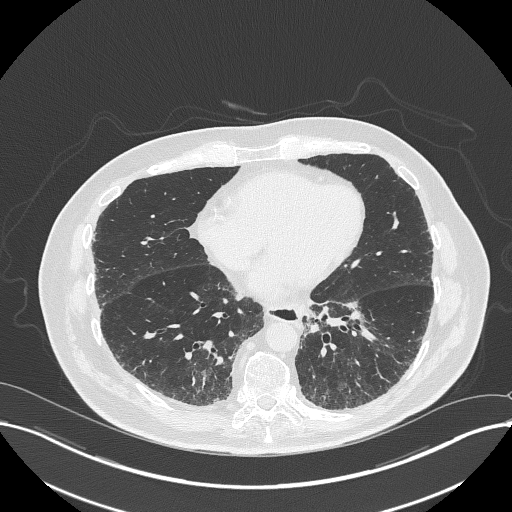
[im 123/296  mediastinal]
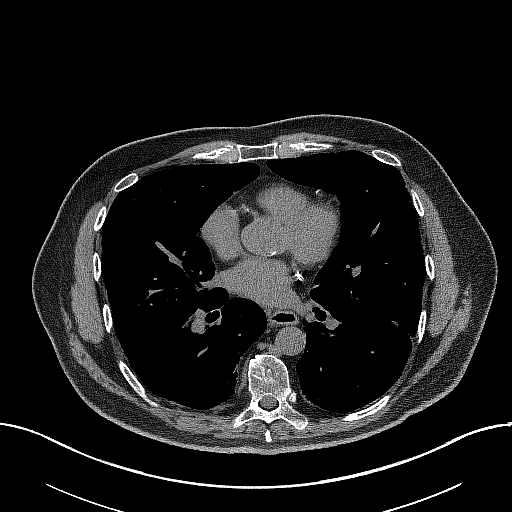
[im 123/296  lung]
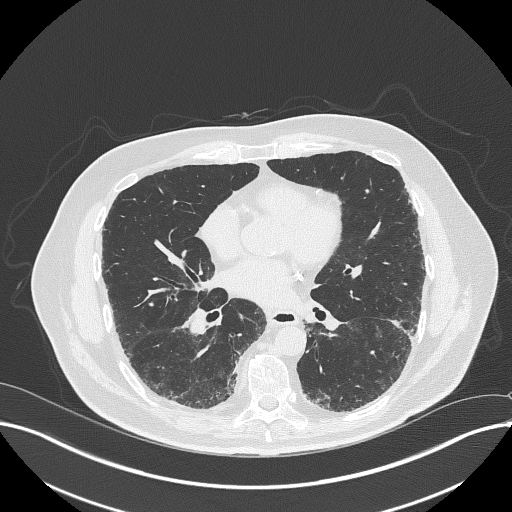
[im 148/296  lung]
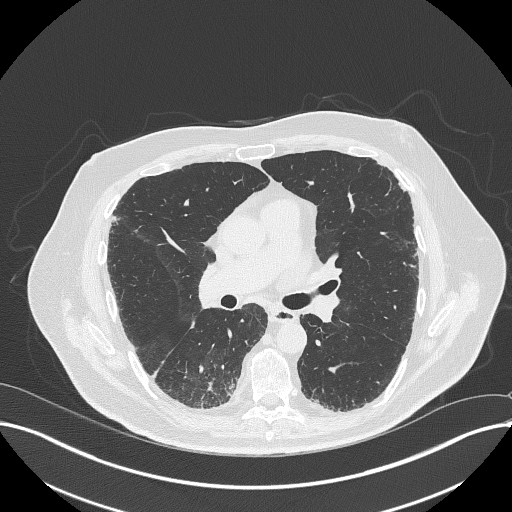
[im 173/296  lung]
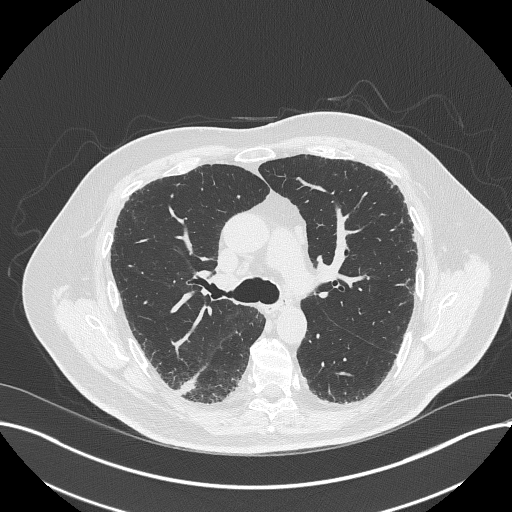
[im 197/296  lung]
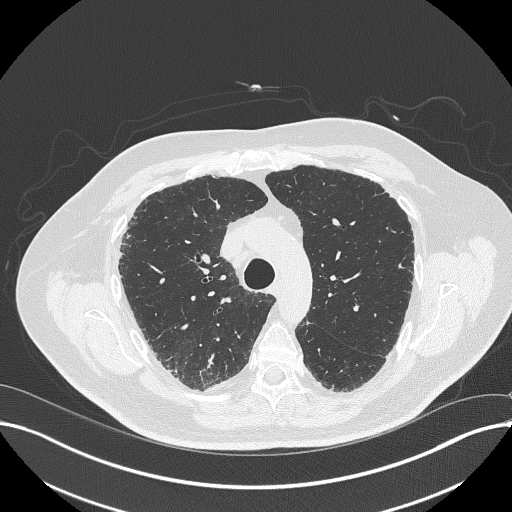
[im 222/296  mediastinal]
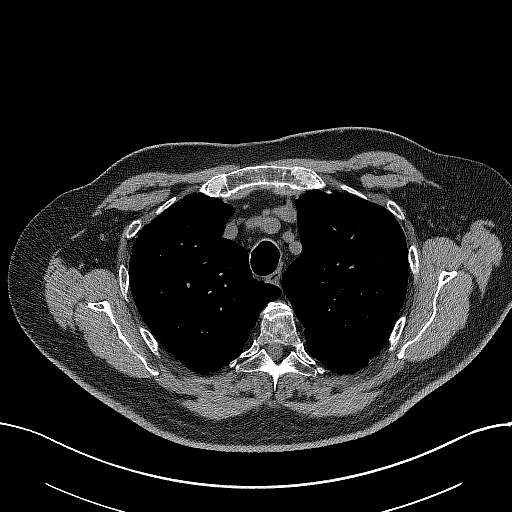
[im 222/296  lung]
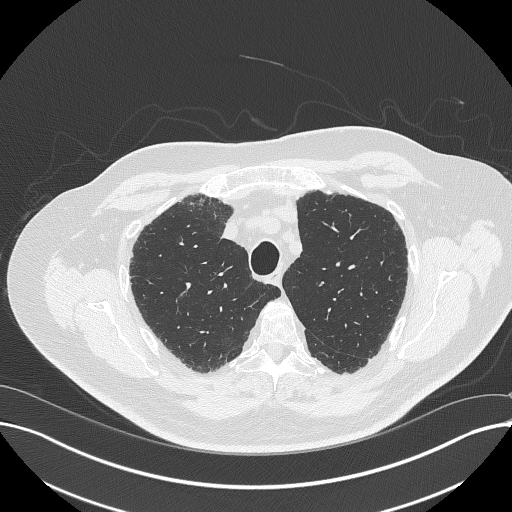
[im 246/296  lung]
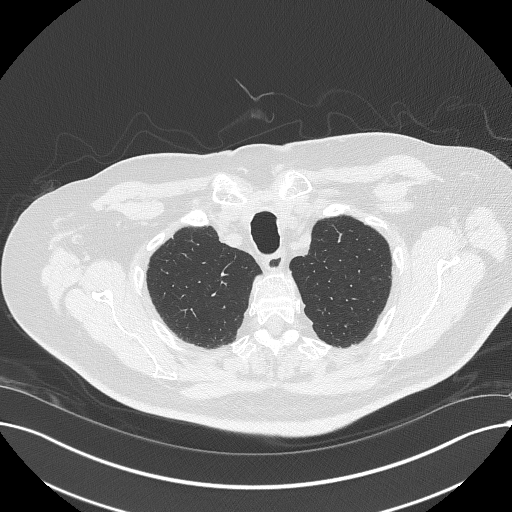
[im 271/296  lung]
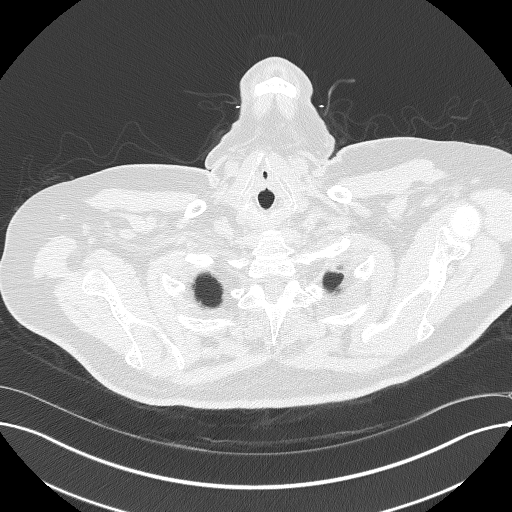

[14 of 36 positions shown; findings below may reference images not displayed]

FINDINGS: Cardiovascular: Heart size is normal. There is no significant
pericardial fluid, thickening or pericardial calcification. There is
aortic atherosclerosis, as well as atherosclerosis of the great
vessels of the mediastinum and the coronary arteries, including
calcified atherosclerotic plaque in the left main, left anterior
descending, left circumflex and right coronary arteries.

Mediastinum/Nodes: No pathologically enlarged mediastinal or hilar
lymph nodes. Esophagus is unremarkable in appearance. No axillary
lymphadenopathy.

Lungs/Pleura: High-resolution images demonstrate widespread
peripheral predominant areas of ground-glass attenuation, septal
thickening, some mild subpleural reticulation and mild cylindrical
bronchiectasis. These findings have a definitive craniocaudal
gradient. No frank honeycombing confidently identified. Inspiratory
and expiratory imaging is limited by patient respiratory motion, but
considered unremarkable. No acute consolidative airspace disease. No
pleural effusions. No definite suspicious appearing pulmonary
nodules or masses are noted.

Upper Abdomen: Aortic atherosclerosis.

Musculoskeletal: There are no aggressive appearing lytic or blastic
lesions noted in the visualized portions of the skeleton.
IMPRESSION: 1. The appearance of the lungs is compatible with interstitial lung
disease, with a spectrum of findings classified as probable usual
interstitial pneumonia (UIP) per current ATS guidelines. Repeat
high-resolution chest CT is recommended in 12 months to assess for
temporal changes in the appearance of the lung parenchyma.
2. Aortic atherosclerosis, in addition to left main and 3 vessel
coronary artery disease. Assessment for potential risk factor
modification, dietary therapy or pharmacologic therapy may be
warranted, if clinically indicated.

Aortic Atherosclerosis ([8G]-[8G]).

## 2020-07-31 ENCOUNTER — Other Ambulatory Visit (HOSPITAL_COMMUNITY): Payer: Self-pay | Admitting: Psychiatry

## 2020-07-31 ENCOUNTER — Telehealth: Payer: Self-pay | Admitting: Pulmonary Disease

## 2020-07-31 ENCOUNTER — Telehealth (HOSPITAL_COMMUNITY): Payer: Medicare Other | Admitting: Psychiatry

## 2020-07-31 DIAGNOSIS — J849 Interstitial pulmonary disease, unspecified: Secondary | ICD-10-CM

## 2020-07-31 DIAGNOSIS — F250 Schizoaffective disorder, bipolar type: Secondary | ICD-10-CM

## 2020-07-31 NOTE — Progress Notes (Signed)
Cardiology Office Note:    Date:  08/04/2020   ID:  Brandon Oliver, DOB 19-Jun-1946, MRN [id number]  PCP:  [name], MD  Cardiologist:  [name], MD   Referring MD: [name], MD   Chief Complaint  Patient presents with  . Congestive Heart Failure  . Coronary Artery Disease    History of Present Illness:    Brandon Oliver is a 74 y.o. male with a hx of CAD with coronary stents after lytic therapy at [facility] in [place], history of bare metal stent circumflex 1998, hyperlipidemia, and schizoaffective disorder.  Brandon Oliver walks 1 mile 4-5 times per week at a pretty rapid clip.  In the last quarter mile he gets moderate shortness of breath but is able to maintain his pace.  This is been stable over 6 months.  There is no associated chest discomfort, palpitation, or other significant problem.  Past Medical History:  Diagnosis Date  . Cataracts, both eyes    none mature  . History of kidney stones    x3 episodes  . Hyperlipidemia   . Myocardial infarction [facility])    coronary stents x2 at that time.  Marland Kitchen Retina disorder    "floaters" ? tear- Dr. Zigmund Daniel follows  . Schizoaffective disorder, bipolar type ([place])    sees MD every 3- 6 months- controlled.    Past Surgical History:  Procedure Laterality Date  . CARDIAC CATHETERIZATION     stents x2- Dr. Linard Millers follows  . COLONOSCOPY WITH PROPOFOL N/A 02/07/2015   Procedure: COLONOSCOPY WITH PROPOFOL;  Surgeon: Garlan Fair, MD;  Location: [facility] ENDOSCOPY;  Service: Endoscopy;  Laterality: N/A;  . HEMORROIDECTOMY      Current Medications: Current Meds  Medication Sig  . aspirin 81 MG tablet Take 81 mg by mouth daily.  . carvedilol (COREG) 3.125 MG tablet Take 3.125 mg by mouth 2 (two) times daily with a meal.   . Coenzyme Q10 (COQ10) 100 MG CAPS Take 100 mg by mouth daily.  . Garlic 123XX123 MG CAPS See admin instructions.  . Multiple Vitamin (MULTIVITAMIN) tablet Take 1  tablet by mouth daily.  . Omega-3 Fatty Acids (FISH OIL) 1000 MG CAPS Take 1,000 mg by mouth daily.   Marland Kitchen omeprazole (PRILOSEC) 40 MG capsule Take 40 mg by mouth daily.  . risperiDONE (RISPERDAL) 1 MG tablet Take 1 tablet (1 mg total) by mouth at bedtime.  . rosuvastatin (CRESTOR) 20 MG tablet Take 20 mg by mouth daily.  . sertraline (ZOLOFT) 100 MG tablet Take 1 tablet (100 mg total) by mouth daily.     Allergies:   Penicillins and Benadryl [diphenhydramine hcl]   Social History   Socioeconomic History  . Marital status: Single    Spouse name: Not on file  . Number of children: Not on file  . Years of education: Not on file  . Highest education level: Not on file  Occupational History  . Not on file  Tobacco Use  . Smoking status: Never Smoker  . Smokeless tobacco: Never Used  Vaping Use  . Vaping Use: Never used  Substance and Sexual Activity  . Alcohol use: No    Alcohol/week: 0.0 standard drinks  . Drug use: No  . Sexual activity: Not Currently  Other Topics Concern  . Not on file  Social History Narrative  . Not on file   Social Determinants of Health   Financial Resource Strain: Not on file  Food Insecurity: Not on file  Transportation Needs: Not on file  Physical Activity: Not on file  Stress: Not on file  Social Connections: Not on file     Family History: The patient's family history includes Cancer in his father, maternal grandmother, mother, paternal aunt, paternal grandfather, paternal uncle, and paternal uncle; Diabetes in his maternal aunt.  ROS:   Please see the history of present illness.    Recently diagnosed with fatty liver.  Also had 5 benign polyps removed.  Has H. pylori and is just completed treatment.  Because of abnormalities on abdominal MRI noted in his lungs, he was referred to pulmonary.  A CT scan was performed and is outlined above.  There was evidence of pulmonary fibrosis and three-vessel coronary calcification.  All other systems  reviewed and are negative.  EKGs/Labs/Other Studies Reviewed:    The following studies were reviewed today: Chest CT 07/25/2020: IMPRESSION: 1. The appearance of the lungs is compatible with interstitial lung disease, with a spectrum of findings classified as probable usual interstitial pneumonia (UIP) per current ATS guidelines. Repeat high-resolution chest CT is recommended in 12 months to assess for temporal changes in the appearance of the lung parenchyma. 2. Aortic atherosclerosis, in addition to left main and 3 vessel coronary artery disease. Assessment for potential risk factor modification, dietary therapy or pharmacologic therapy may be warranted, if clinically indicated.  Aortic Atherosclerosis (ICD10-I70.0).  EKG:  EKG normal sinus rhythm with inferolateral Q waves in October 2020.  Current tracing shows no significant difference.  Recent Labs: No results found for requested labs within last 8760 hours.  Recent Lipid Panel No results found for: CHOL, TRIG, HDL, CHOLHDL, VLDL, LDLCALC, LDLDIRECT  Physical Exam:    VS:  BP 128/72   Pulse 79   Ht 5\' 9"  (1.753 m)   Wt 183 lb (83 kg)   SpO2 98%   BMI 27.02 kg/m     Wt Readings from Last 3 Encounters:  08/04/20 183 lb (83 kg)  07/19/20 191 lb 9.6 oz (86.9 kg)  05/27/19 201 lb (91.2 kg)     GEN: Obese. No acute distress HEENT: Normal NECK: No JVD. LYMPHATICS: No lymphadenopathy CARDIAC: No murmur. RRR no gallop, or edema. VASCULAR:  Normal Pulses. No bruits. RESPIRATORY:  Clear to auscultation without rales, wheezing or rhonchi  ABDOMEN: Soft, non-tender, non-distended, No pulsatile mass, MUSCULOSKELETAL: No deformity  SKIN: Warm and dry NEUROLOGIC:  Alert and oriented x 3 PSYCHIATRIC:  Normal affect   ASSESSMENT:    1. Atherosclerosis of coronary artery of native heart with stable angina pectoris, unspecified vessel or lesion type ([place])   2. Mixed hyperlipidemia   3. Dyspnea on exertion   4.  Pulmonary fibrosis ([place])   5. Educated about COVID-19 virus infection   6. Hepatic steatosis    PLAN:    In order of problems listed above:  1. Secondary prevention discussed.  Encouraged to get at least 30 minutes of moderate activity 5 out of 7 days/week.  If dyspnea worsens, will need nuclear scintigraphy to rule out progression of coronary disease. 2. LDL target less than 70.  Most recent LDL was 63 in March 2021.  Continue Crestor 20 mg/day. 3. Possibly related to diastolic dysfunction.  Probably related in someway to pulmonary fibrosis.  Stable.  Encouraged exercise. 4. Etiology is uncertain. 5. Vaccinated, boosted, and practicing mitigation. 6. This is a definite risk factor for cardiovascular events.  Overall education and awareness concerning secondary risk prevention was discussed  in detail: LDL less than 70, hemoglobin A1c less than 7, blood pressure target less than 130/80 mmHg, >150 minutes of moderate aerobic activity per week, avoidance of smoking, weight control (via diet and exercise), and continued surveillance/management of/for obstructive sleep apnea.   Medication Adjustments/Labs and Tests Ordered: Current medicines are reviewed at length with the patient today.  Concerns regarding medicines are outlined above.  Orders Placed This Encounter  Procedures  . EKG 12-Lead   No orders of the defined types were placed in this encounter.   Patient Instructions  Medication Instructions:  Your physician recommends that you continue on your current medications as directed. Please refer to the Current Medication list given to you today.  *If you need a refill on your cardiac medications before your next appointment, please call your pharmacy*   Lab Work: None If you have labs (blood work) drawn today and your tests are completely normal, you will receive your results only by: Marland Kitchen MyChart Message (if you have MyChart) OR . A paper copy in the mail If you have any lab test  that is abnormal or we need to change your treatment, we will call you to review the results.   Testing/Procedures: None   Follow-Up: At [facility], you and your health needs are our priority.  As part of our continuing mission to provide you with exceptional heart care, we have created designated Provider Care Teams.  These Care Teams include your primary Cardiologist (physician) and Advanced Practice Providers (APPs -  Physician Assistants and Nurse Practitioners) who all work together to provide you with the care you need, when you need it.  We recommend signing up for the patient portal called "MyChart".  Sign up information is provided on this After Visit Summary.  MyChart is used to connect with patients for Virtual Visits (Telemedicine).  Patients are able to view lab/test results, encounter notes, upcoming appointments, etc.  Non-urgent messages can be sent to your provider as well.   To learn more about what you can do with MyChart, go to [email or web address].    Your next appointment:   9 month(s)  The format for your next appointment:   In Person  Provider:   You may see Brandon Noe, MD or one of the following Advanced Practice Providers on your designated Care Team:    Norma Fredrickson, NP  Nada Boozer, NP  Georgie Chard, NP    Other Instructions      Signed, Brandon Noe, MD  08/04/2020 12:55 PM    [facility]

## 2020-07-31 NOTE — Telephone Encounter (Signed)
PM pt is calling for the results of his CT scan.  Please advise. Thanks

## 2020-08-01 NOTE — Telephone Encounter (Signed)
ILD orders placed and ILD packet at front desk for Patient to pick up. Called and spoke with Patient to schedule follow up appointment with Dr. Isaiah Serge.  Patient stated he was driving at the time, but would schedule OV with Dr. Isaiah Serge tomorrow when he comes for labs.

## 2020-08-01 NOTE — Telephone Encounter (Signed)
Reviewed CT scan results with patient showing findings of probable UIP  Will order ILD serologies and have him pick up an ILD questionnaire at front desk to check for exposures Please make follow-up visit with me at next available.

## 2020-08-02 ENCOUNTER — Other Ambulatory Visit (HOSPITAL_COMMUNITY): Payer: Self-pay | Admitting: Psychiatry

## 2020-08-02 ENCOUNTER — Telehealth: Payer: Self-pay | Admitting: Pulmonary Disease

## 2020-08-02 ENCOUNTER — Other Ambulatory Visit: Payer: Medicare Other

## 2020-08-02 DIAGNOSIS — F419 Anxiety disorder, unspecified: Secondary | ICD-10-CM

## 2020-08-02 DIAGNOSIS — J849 Interstitial pulmonary disease, unspecified: Secondary | ICD-10-CM | POA: Diagnosis not present

## 2020-08-02 DIAGNOSIS — F250 Schizoaffective disorder, bipolar type: Secondary | ICD-10-CM

## 2020-08-02 NOTE — Telephone Encounter (Signed)
Complaints of cough which he states will come and go. He is unsure if cough is worse in the morning, throughout the day, or at night.  Pt wonders if this could be reflux related. He states after certain things he eats, he will cough some. Denies any complaints of any burning sensation in chest after eating certain foods.  States he usually coughs after talking on the phone. Pt states when he lays down, he will lay reclining some instead of lying down flat.  Asked pt if he is taking any acid reflux medications and he states that he has been taking prilosec which was prescribed by GI doctor about a month ago. Pt said that he will be weaned off of this and then will be having a test done to see if the infection from gastritis is done.  Pt said that the prilosec has been helping with his symptoms. Stated to him when he is weaned off of med by GI if reflux gets worse to either call our office or discuss this with GI and he verbalized understanding. Nothing further needed.

## 2020-08-02 NOTE — Telephone Encounter (Signed)
Patient came to office today. Patient  picked up ILD packet and completed labs.  Patient scheduled 08/23/20 for follow up with Dr. Isaiah Serge.

## 2020-08-03 ENCOUNTER — Telehealth (INDEPENDENT_AMBULATORY_CARE_PROVIDER_SITE_OTHER): Payer: Medicare Other | Admitting: Psychiatry

## 2020-08-03 ENCOUNTER — Encounter (HOSPITAL_COMMUNITY): Payer: Self-pay | Admitting: Psychiatry

## 2020-08-03 ENCOUNTER — Other Ambulatory Visit: Payer: Self-pay

## 2020-08-03 DIAGNOSIS — F250 Schizoaffective disorder, bipolar type: Secondary | ICD-10-CM | POA: Diagnosis not present

## 2020-08-03 DIAGNOSIS — F419 Anxiety disorder, unspecified: Secondary | ICD-10-CM | POA: Diagnosis not present

## 2020-08-03 MED ORDER — SERTRALINE HCL 100 MG PO TABS: 100.0000 mg | ORAL_TABLET | Freq: Every day | ORAL | 0 refills | Status: DC

## 2020-08-03 MED ORDER — RISPERIDONE 1 MG PO TABS: 1.0000 mg | ORAL_TABLET | Freq: Every day | ORAL | 0 refills | Status: DC

## 2020-08-03 NOTE — Progress Notes (Signed)
Virtual Visit via Telephone Note  I connected with Brandon Oliver on 08/03/20 at 10:40 AM EST by telephone and verified that I am speaking with the correct person using two identifiers.  Location: Patient: Home Provider: Home Office   I discussed the limitations, risks, security and privacy concerns of performing an evaluation and management service by telephone and the availability of in person appointments. I also discussed with the patient that there may be a patient responsible charge related to this service. The patient expressed understanding and agreed to proceed.   History of Present Illness: Patient is evaluated by phone session.  He is on the phone by himself.  He admitted some anxiety because recently seen his physician and found that he has fluid in his lungs and he may have a lung infection.  He is taking medicine and hoping it will cleared up.  However he also have fatty liver and some stomach issue and now his physician thinking about autoimmune disease and he is having some blood work.  Overall he feels his anxiety, paranoia is under control.  He denies any paranoia or any hallucination.  He had a good Christmas.  He had a good time with the family off his deceased friend [name].  He is sleeping good.  He has a good energy level.  He is scheduled to have his blood work with his PCP in March.  He does not want to reduce his medication because he feels things are going well and he does not want to get more depressed or paranoid.  We have decreased his Risperdal from 2 mg to 1 mg in the past.  Denies drinking or using any illegal substances.  He denies any tremors shakes or any EPS.  He is trying to lose weight and he had lost the weight since the last visit.  Past Psychiatric History: H/O inpatientin 2005due topsychosis and paranoia at behavioral [place].   Psychiatric Specialty Exam: Physical Exam  Review of Systems  Weight 186 lb (84.4 kg).There is no height or  weight on file to calculate BMI.  General Appearance: NA  Eye Contact:  NA  Speech:  Clear and Coherent  Volume:  Normal  Mood:  Euthymic  Affect:  NA  Thought Process:  Goal Directed  Orientation:  Full (Time, Place, and Person)  Thought Content:  NA  Suicidal Thoughts:  No  Homicidal Thoughts:  No  Memory:  Immediate;   Good Recent;   Good Remote;   Good  Judgement:  Good  Insight:  Present  Psychomotor Activity:  NA  Concentration:  Concentration: Good and Attention Span: Good  Recall:  Good  Fund of Knowledge:  Good  Language:  Good  Akathisia:  No  Handed:  Right  AIMS (if indicated):     Assets:  Communication Skills Desire for Improvement Housing Resilience Social Support  ADL's:  Intact  Cognition:  WNL  Sleep:   8 hrs      Assessment and Plan: Schizoaffective disorder, bipolar type.  Anxiety.  Discussed patient's concern about his health issues.  I recommend to cut down further Respinol but he is reluctant since things are going well.  He had a blood work schedule and visit to see his PCP Dr. Harrington Challenger at [place].  I recommend to have his labs faxed to [place] as recently diagnosed with pulmonary infection and fatty liver.  However he was told his liver function test is normal.  Discussed medication side effects and benefits.  Recommended to call us back if is any question or any concern.  Follow-up in 3 months.  Follow Up Instructions:    I discussed the assessment and treatment plan with the patient. The patient was provided an opportunity to ask questions and all were answered. The patient agreed with the plan and demonstrated an understanding of the instructions.   The patient was advised to call back or seek an in-person evaluation if the symptoms worsen or if the condition fails to improve as anticipated.  I provided 16 minutes of non-face-to-face time during this encounter.   Cleotis Nipper, MD

## 2020-08-04 ENCOUNTER — Ambulatory Visit: Payer: Medicare Other | Admitting: Interventional Cardiology

## 2020-08-04 ENCOUNTER — Other Ambulatory Visit: Payer: Self-pay

## 2020-08-04 ENCOUNTER — Encounter: Payer: Self-pay | Admitting: Interventional Cardiology

## 2020-08-04 VITALS — BP 128/72 | HR 79 | Ht 69.0 in | Wt 183.0 lb

## 2020-08-04 DIAGNOSIS — R0600 Dyspnea, unspecified: Secondary | ICD-10-CM | POA: Diagnosis not present

## 2020-08-04 DIAGNOSIS — Z7189 Other specified counseling: Secondary | ICD-10-CM | POA: Diagnosis not present

## 2020-08-04 DIAGNOSIS — E782 Mixed hyperlipidemia: Secondary | ICD-10-CM | POA: Diagnosis not present

## 2020-08-04 DIAGNOSIS — J8410 Pulmonary fibrosis, unspecified: Secondary | ICD-10-CM | POA: Diagnosis not present

## 2020-08-04 DIAGNOSIS — K760 Fatty (change of) liver, not elsewhere classified: Secondary | ICD-10-CM

## 2020-08-04 DIAGNOSIS — I25118 Atherosclerotic heart disease of native coronary artery with other forms of angina pectoris: Secondary | ICD-10-CM

## 2020-08-04 DIAGNOSIS — R0609 Other forms of dyspnea: Secondary | ICD-10-CM

## 2020-08-04 LAB — ANTI-SCLERODERMA ANTIBODY: Scleroderma (Scl-70) (ENA) Antibody, IgG: <1 AI

## 2020-08-04 LAB — ANCA SCREEN W REFLEX TITER: ANCA Screen: NEGATIVE

## 2020-08-04 LAB — ANA,IFA RA DIAG PNL W/RFLX TIT/PATN
Anti Nuclear Antibody (ANA): POSITIVE — AB
Cyclic Citrullin Peptide Ab: <16 UNITS
Rheumatoid fact SerPl-aCnc: <14 IU/mL (ref <14)

## 2020-08-04 LAB — SJOGREN'S SYNDROME ANTIBODS(SSA + SSB)
SSA (Ro) (ENA) Antibody, IgG: <1 AI
SSB (La) (ENA) Antibody, IgG: <1 AI

## 2020-08-04 LAB — ANTI-NUCLEAR AB-TITER (ANA TITER): ANA Titer 1: 1:1280 {titer} — ABNORMAL HIGH

## 2020-08-04 NOTE — Patient Instructions (Signed)
Medication Instructions:  Your physician recommends that you continue on your current medications as directed. Please refer to the Current Medication list given to you today.  *If you need a refill on your cardiac medications before your next appointment, please call your pharmacy*   Lab Work: None If you have labs (blood work) drawn today and your tests are completely normal, you will receive your results only by: . MyChart Message (if you have MyChart) OR . A paper copy in the mail If you have any lab test that is abnormal or we need to change your treatment, we will call you to review the results.   Testing/Procedures: None   Follow-Up: At [facility], you and your health needs are our priority.  As part of our continuing mission to provide you with exceptional heart care, we have created designated Provider Care Teams.  These Care Teams include your primary Cardiologist (physician) and Advanced Practice Providers (APPs -  Physician Assistants and Nurse Practitioners) who all work together to provide you with the care you need, when you need it.  We recommend signing up for the patient portal called "MyChart".  Sign up information is provided on this After Visit Summary.  MyChart is used to connect with patients for Virtual Visits (Telemedicine).  Patients are able to view lab/test results, encounter notes, upcoming appointments, etc.  Non-urgent messages can be sent to your provider as well.   To learn more about what you can do with MyChart, go to [email or web address].    Your next appointment:   9 month(s)  The format for your next appointment:   In Person  Provider:   You may see Henry W Smith III, MD or one of the following Advanced Practice Providers on your designated Care Team:    Lori Gerhardt, NP  Laura Ingold, NP  Jill McDaniel, NP    Other Instructions   

## 2020-08-07 LAB — HYPERSENSITIVITY PNEUMONITIS
A. Pullulans Abs: NEGATIVE
A.Fumigatus #1 Abs: NEGATIVE
Micropolyspora faeni, IgG: NEGATIVE
Pigeon Serum Abs: NEGATIVE
Thermoact. Saccharii: NEGATIVE
Thermoactinomyces vulgaris, IgG: NEGATIVE

## 2020-08-09 ENCOUNTER — Telehealth: Payer: Self-pay | Admitting: Pulmonary Disease

## 2020-08-09 DIAGNOSIS — R768 Other specified abnormal immunological findings in serum: Secondary | ICD-10-CM

## 2020-08-09 NOTE — Telephone Encounter (Signed)
Called and spoke with Patient. Dr. Matilde Bash results and recommendations given. Understanding stated. Referral placed. Nothing further at this time.

## 2020-08-19 LAB — MYOMARKER 3 PLUS PROFILE (RDL)
Anti-EJ Ab (RDL): NEGATIVE
Anti-Jo-1 Ab (RDL): <20 Units (ref <20)
Anti-Ku Ab (RDL): NEGATIVE
Anti-MDA-5 Ab (CADM-140)(RDL): <20 Units (ref <20)
Anti-Mi-2 Ab (RDL): NEGATIVE
Anti-NXP-2 (P140) Ab (RDL): <20 Units (ref <20)
Anti-OJ Ab (RDL): NEGATIVE
Anti-PL-12 Ab (RDL: NEGATIVE
Anti-PL-7 Ab (RDL): NEGATIVE
Anti-PM/Scl-100 Ab (RDL): <20 Units (ref <20)
Anti-SAE1 Ab, IgG (RDL): <20 Units (ref <20)
Anti-SRP Ab (RDL): POSITIVE — AB
Anti-SS-A 52kD Ab, IgG (RDL): <20 Units (ref <20)
Anti-TIF-1gamma Ab (RDL): <20 Units (ref <20)
Anti-U1 RNP Ab (RDL): <20 Units (ref <20)
Anti-U2 RNP Ab (RDL): NEGATIVE
Anti-U3 RNP (Fibrillarin)(RDL): NEGATIVE

## 2020-08-23 ENCOUNTER — Other Ambulatory Visit: Payer: Self-pay

## 2020-08-23 ENCOUNTER — Encounter: Payer: Self-pay | Admitting: Pulmonary Disease

## 2020-08-23 ENCOUNTER — Ambulatory Visit: Payer: Medicare Other | Admitting: Pulmonary Disease

## 2020-08-23 VITALS — BP 124/62 | HR 77 | Temp 97.5°F | Ht 69.0 in | Wt 186.6 lb

## 2020-08-23 DIAGNOSIS — J849 Interstitial pulmonary disease, unspecified: Secondary | ICD-10-CM | POA: Diagnosis not present

## 2020-08-23 NOTE — Patient Instructions (Addendum)
Schedule pulmonary function test for better evaluation of your lungs  Follow-up in early March for reevaluation after your rheumatology visit.

## 2020-08-23 NOTE — Progress Notes (Signed)
Brandon Oliver    [id number]    October 18, 1945  Primary Care Physician:Ross, Dwyane Luo, MD  Referring Physician: [name], MD [place],  [place] [id number]  Chief complaint: Follow-up for interstitial lung disease  HPI: 75 year old with history of hypertension, MI, schizoaffective disorder Sent here for an abnormal chest x-ray that he got from his primary care.  He was told that he had "fluid, lung nodules and damage to the lungs" on the chest x-ray needed a pulmonary evaluation  Complains of mild dyspnea on exertion.  He is able to exercise and walks up to a mile without any issue every day.  Denies any cough, sputum production, fevers, chills.  He has history of coronary artery disease, heart failure and follows with Dr. 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 Exposures: No known exposures.  No mold, hot tub, Jacuzzi Smoking history: Never smoker Travel history: Originally from [place].  However travel to [place] in October 2021 Relevant family history: No significant family history of lung disease.  Interim history: Patient is here for review of CT and lab tests. He has been referred to rheumatology for elevated ANA and has an appointment next month with Dr. Benjamine Mola.  States that breathing is stable with mild dyspnea on exertion with no change since last time.  Outpatient Encounter Medications as of 08/23/2020  Medication Sig  . aspirin 81 MG tablet Take 81 mg by mouth daily.  . carvedilol (COREG) 3.125 MG tablet Take 3.125 mg by mouth 2 (two) times daily with a meal.   . Coenzyme Q10 (COQ10) 100 MG CAPS Take 100 mg by mouth daily.  . Garlic 5643 MG CAPS See admin instructions.  . Multiple Vitamin (MULTIVITAMIN) tablet Take 1 tablet by mouth daily.  Marland Kitchen omeprazole (PRILOSEC) 40 MG capsule Take 40 mg by mouth daily.  . risperiDONE (RISPERDAL) 1 MG tablet Take 1 tablet (1 mg total) by  mouth at bedtime.  . rosuvastatin (CRESTOR) 20 MG tablet Take 20 mg by mouth daily.  . sertraline (ZOLOFT) 100 MG tablet Take 1 tablet (100 mg total) by mouth daily.  . Omega-3 Fatty Acids (FISH OIL) 1000 MG CAPS Take 1,000 mg by mouth daily.  (Patient not taking: Reported on 08/23/2020)   No facility-administered encounter medications on file as of 08/23/2020.    Physical Exam: Blood pressure 124/62, pulse 77, temperature (!) 97.5 F (36.4 C), temperature source Skin, height 5\' 9"  (1.753 m), weight 186 lb 9.6 oz (84.6 kg), SpO2 98 %. Gen:      No acute distress HEENT:  EOMI, sclera anicteric Neck:     No masses; no thyromegaly Lungs:    Clear to auscultation bilaterally; normal respiratory effort CV:         Regular rate and rhythm; no murmurs Abd:      + bowel sounds; soft, non-tender; no palpable masses, no distension Ext:    No edema; adequate peripheral perfusion Skin:      Warm and dry; no rash Neuro: alert and oriented x 3 Psych: normal mood and affect  Data Reviewed: Imaging: Chest x-ray from primary care 06/06/2020- pulmonary vascular congestion with bibasal airspace disease/atelectasis.    High-resolution CT 07/25/2020-peripheral groundglass with septal thickening, mild bronchiectasis and probable UIP pattern.  Aortic and three-vessel coronary atherosclerosis   PFTs:  Labs: CTD serologies 08/02/2020-ANA 1:1280, nuclear, SRP weakly positive  Assessment:  Interstitial lung disease I have reviewed CT which shows probable UIP pattern pulmonary fibrosis.  May be related to connective tissue disease given high ANA serologies with nuclear pattern and positive SRP  We have referred him to rheumatology. Schedule PFTs Follow-up in clinic after rheumatology evaluation and plan for next steps.  Plan/Recommendations: PFTs Rheumatology evaluation.  Marshell Garfinkel MD [facility] 08/23/2020, 12:04 PM  CC: [name], MD

## 2020-09-01 ENCOUNTER — Other Ambulatory Visit (HOSPITAL_COMMUNITY)
Admission: RE | Admit: 2020-09-01 | Discharge: 2020-09-01 | Disposition: A | Discharge status: Home / Self Care | Payer: Medicare Other | Source: Ambulatory Visit | Attending: Pulmonary Disease | Admitting: Pulmonary Disease

## 2020-09-01 DIAGNOSIS — Z20822 Contact with and (suspected) exposure to covid-19: Secondary | ICD-10-CM | POA: Diagnosis not present

## 2020-09-01 LAB — SARS CORONAVIRUS 2 (TAT 6-24 HRS): SARS Coronavirus 2: NEGATIVE

## 2020-09-05 ENCOUNTER — Other Ambulatory Visit: Payer: Self-pay

## 2020-09-05 ENCOUNTER — Ambulatory Visit (INDEPENDENT_AMBULATORY_CARE_PROVIDER_SITE_OTHER): Payer: Medicare Other | Admitting: Pulmonary Disease

## 2020-09-05 DIAGNOSIS — J849 Interstitial pulmonary disease, unspecified: Secondary | ICD-10-CM | POA: Diagnosis not present

## 2020-09-05 LAB — PULMONARY FUNCTION TEST
DL/VA % pred: 100 %
DL/VA: 4.03 ml/min/mmHg/L
DLCO cor % pred: 90 %
DLCO cor: 22.25 ml/min/mmHg
DLCO unc % pred: 90 %
DLCO unc: 22.25 ml/min/mmHg
FEF 25-75 Post: 2.39 L/sec
FEF 25-75 Pre: 1.95 L/sec
FEF2575-%Change-Post: 22 %
FEF2575-%Pred-Post: 109 %
FEF2575-%Pred-Pre: 89 %
FEV1-%Change-Post: 5 %
FEV1-%Pred-Post: 90 %
FEV1-%Pred-Pre: 85 %
FEV1-Post: 2.69 L
FEV1-Pre: 2.55 L
FEV1FVC-%Change-Post: 3 %
FEV1FVC-%Pred-Pre: 102 %
FEV6-%Change-Post: 1 %
FEV6-%Pred-Post: 90 %
FEV6-%Pred-Pre: 88 %
FEV6-Post: 3.49 L
FEV6-Pre: 3.43 L
FEV6FVC-%Pred-Post: 106 %
FEV6FVC-%Pred-Pre: 106 %
FVC-%Change-Post: 1 %
FVC-%Pred-Post: 84 %
FVC-%Pred-Pre: 83 %
FVC-Post: 3.49 L
FVC-Pre: 3.43 L
Post FEV1/FVC ratio: 77 %
Post FEV6/FVC ratio: 100 %
Pre FEV1/FVC ratio: 74 %
Pre FEV6/FVC Ratio: 100 %
RV % pred: 85 %
RV: 2.12 L
TLC % pred: 79 %
TLC: 5.42 L

## 2020-09-05 NOTE — Progress Notes (Signed)
Full PFT performed today. °

## 2020-09-11 ENCOUNTER — Encounter: Payer: Self-pay | Admitting: Pulmonary Disease

## 2020-09-13 ENCOUNTER — Telehealth: Payer: Self-pay | Admitting: Pulmonary Disease

## 2020-09-13 NOTE — Telephone Encounter (Signed)
Spoke with patient. He stated that he had a PFT performed on 09/05/20 and he was calling for the results. He is aware that I will send a message to Dr. Vaughan Browner to advise on the results.   Dr. Vaughan Browner, can you please advise on his PFT results? Thanks!

## 2020-09-13 NOTE — Telephone Encounter (Signed)
Please let him know that the test is normal

## 2020-09-13 NOTE — Telephone Encounter (Signed)
Called and spoke with pt letting him know the results of PFT and he verbalized understanding. Pt stated that he does have an appt scheduled with rheumatologist so I have scheduled pt an appt with Dr. Vaughan Browner after that appt. Nothing further needed.

## 2020-09-15 DIAGNOSIS — A048 Other specified bacterial intestinal infections: Secondary | ICD-10-CM | POA: Diagnosis not present

## 2020-09-20 ENCOUNTER — Other Ambulatory Visit: Payer: Self-pay

## 2020-09-20 ENCOUNTER — Encounter: Payer: Self-pay | Admitting: Internal Medicine

## 2020-09-20 ENCOUNTER — Ambulatory Visit: Payer: Medicare Other | Admitting: Internal Medicine

## 2020-09-20 VITALS — BP 121/78 | HR 74 | Ht 69.0 in | Wt 189.8 lb

## 2020-09-20 DIAGNOSIS — K760 Fatty (change of) liver, not elsewhere classified: Secondary | ICD-10-CM

## 2020-09-20 DIAGNOSIS — J849 Interstitial pulmonary disease, unspecified: Secondary | ICD-10-CM

## 2020-09-20 DIAGNOSIS — R768 Other specified abnormal immunological findings in serum: Secondary | ICD-10-CM

## 2020-09-20 NOTE — Progress Notes (Signed)
Office Visit Note  Patient: Brandon Oliver             Date of Birth: 04/30/1946           MRN: [id number]             PCP: [name], MD Referring: Marshell Garfinkel, MD Visit Date: 09/20/2020   Subjective:  New Patient (Initial Visit) (Patient denies joint pain, stiffness, swelling. Patient was recently evaluated for ILD. )   History of Present Illness: Brandon Oliver is a 75 y.o. male with hitory of CAD, HLD, schizoaffective disorder, and GERD here for evaluation of positive ANA and SRP Abs with new diagnosis of ILD. He has been in stable health condition without recent medication changes. He did have symptoms of cough and sometimes dyspnea and CT chest was performed showing changes in bilateral lung bases. Subsequent workup showed apparently normal pulmonary function testing. Lab tests showed highly positive ANA Abs. He does not complain of current joint pain or stiffness. He denies skin rashes, lymphadenopathy, raynaud's phenomenon, pleurisy, fevers, or history of blood clots. He has no focal muscle weakness, although has some generalized weakness but is not sure if this is just deconditioning. He has a family history of rheumatoid arthritis but no other known autoimmune disease.   Labs reviewed 07/2020 ANA 1:1,280 DFS SRP weak pos SSA, SSB, Scl-70 neg Rest MyoMarker 3 Panel neg ANCAs neg  Imaging reviewed 06/2020 CT Chest IMPRESSION: 1. The appearance of the lungs is compatible with interstitial lung disease, with a spectrum of findings classified as probable usual interstitial pneumonia (UIP) per current ATS guidelines. Repeat high-resolution chest CT is recommended in 12 months to assess for temporal changes in the appearance of the lung parenchyma. 2. Aortic atherosclerosis, in addition to left main and 3 vessel coronary artery disease. Assessment for potential risk factor modification, dietary therapy or pharmacologic therapy may be warranted, if clinically  indicated.  Activities of Daily Living:  Patient reports morning stiffness for 0 minutes.   Patient Denies nocturnal pain.  Difficulty dressing/grooming: Denies Difficulty climbing stairs: Denies Difficulty getting out of chair: Reports Difficulty using hands for taps, buttons, cutlery, and/or writing: Denies  Review of Systems  Constitutional: Negative for fatigue.  HENT: Negative for mouth sores, mouth dryness and nose dryness.   Eyes: Negative for pain, itching, visual disturbance and dryness.  Respiratory: Positive for cough. Negative for hemoptysis, shortness of breath and difficulty breathing.   Cardiovascular: Negative for chest pain, palpitations and swelling in legs/feet.  Gastrointestinal: Negative for abdominal pain, blood in stool, constipation and diarrhea.  Endocrine: Positive for increased urination.  Genitourinary: Negative for painful urination.  Musculoskeletal: Negative for arthralgias, joint pain, joint swelling, myalgias, muscle weakness, morning stiffness, muscle tenderness and myalgias.  Skin: Negative for color change, rash and redness.  Allergic/Immunologic: Negative for susceptible to infections.  Neurological: Negative for dizziness, numbness, headaches, memory loss and weakness.  Hematological: Negative for swollen glands.  Psychiatric/Behavioral: Negative for confusion and sleep disturbance.    PMFS History:  Patient Active Problem List   Diagnosis Date Noted  . Positive ANA (antinuclear antibody) 09/20/2020  . Interstitial lung disease ([place]) 09/20/2020  . Hepatic steatosis 09/20/2020  . Coronary atherosclerosis 06/29/2013    Class: Chronic  . Hyperlipidemia 06/29/2013  . Depression, endogenous ([place]) 06/29/2013    Class: Diagnosis of  . Schizoaffective disorder ([place]) 05/20/2012  . GERD (gastroesophageal reflux disease) 11/20/2011    Past Medical History:  Diagnosis Date  .  Cataracts, both eyes    none mature  . History of kidney stones    x3  episodes  . Hyperlipidemia   . Myocardial infarction [facility])    coronary stents x2 at that time.  Marland Kitchen Retina disorder    "floaters" ? tear- Dr. Zigmund Daniel follows  . Schizoaffective disorder, bipolar type ([place])    sees MD every 3- 6 months- controlled.    Family History  Problem Relation Age of Onset  . Cancer Mother        breast  . Cancer Father        colon  . Parkinson's disease Father   . Rheum arthritis Father   . Diabetes Maternal Aunt   . Cancer Paternal Aunt        "male" cancer  . Cancer Paternal Uncle        pancreatic  . Cancer Paternal Grandfather        colon  . Cancer Paternal Uncle        lung  . Cancer Maternal Grandmother        pancreatic   Past Surgical History:  Procedure Laterality Date  . CARDIAC CATHETERIZATION     stents x2- Dr. Linard Millers follows  . COLONOSCOPY WITH PROPOFOL N/A 02/07/2015   Procedure: COLONOSCOPY WITH PROPOFOL;  Surgeon: Garlan Fair, MD;  Location: [facility] END[facility]COPY;  Service: Endoscopy;  Laterality: N/A;  . HEMORROIDECTOMY     Social History   Social History Narrative  . Not on file   Immunization History  Administered Date(s) Administered  . Influenza, High Dose Seasonal PF 05/13/2018, 05/07/2019, 05/02/2020  . PFIZER(Purple Top)SARS-COV-2 Vaccination 09/21/2019, 10/12/2019, 05/02/2020     Objective: Vital Signs: BP 121/78 (BP Location: Right Arm, Patient Position: Sitting, Cuff Size: Normal)   Pulse 74   Ht 5' 9"  (1.753 m)   Wt 189 lb 12.8 oz (86.1 kg)   BMI 28.03 kg/m    Physical Exam HENT:     Right Ear: External ear normal.     Left Ear: External ear normal.     Mouth/Throat:     Mouth: Mucous membranes are moist.     Pharynx: Oropharynx is clear.  Eyes:     Conjunctiva/sclera: Conjunctivae normal.  Cardiovascular:     Rate and Rhythm: Normal rate and regular rhythm.  Pulmonary:     Effort: Pulmonary effort is normal.     Breath sounds: Normal breath sounds.  Skin:    General: Skin is warm and dry.      Findings: No rash.  Neurological:     General: No focal deficit present.     Mental Status: He is alert.     Deep Tendon Reflexes: Reflexes normal.     Comments: Strength 5 out of 5 throughout shoulder abduction, elbow extension flexion, grip strength, hip flexion, knee flexion and extension, ankle plantar flexion dorsiflexion  Psychiatric:        Mood and Affect: Mood normal.     Musculoskeletal Exam:  Neck full ROM no tenderness Shoulders full ROM no tenderness or swelling Elbows full ROM no tenderness or swelling Wrists full ROM no tenderness or swelling Fingers full ROM no tenderness or swelling Knees full ROM no tenderness or swelling Ankles full ROM no tenderness or swelling   Investigation: No additional findings.  Imaging: No results found.  Recent Labs: Lab Results  Component Value Date   WBC 7.3 02/22/2016   HGB 14.0 02/22/2016   PLT 223 02/22/2016   NA 136  02/22/2016   K 4.2 02/22/2016   CL 105 02/22/2016   CO2 22 02/22/2016   GLUCOSE 104 (H) 02/22/2016   BUN 14 02/22/2016   CREATININE 0.84 02/22/2016   CALCIUM 9.2 02/22/2016   GFRAA >60 02/22/2016    Speciality Comments: No specialty comments available.  Procedures:  No procedures performed Allergies: Penicillins and Benadryl [diphenhydramine hcl]   Assessment / Plan:     Visit Diagnoses: Positive ANA (antinuclear antibody) - Plan: C3 and C4, Sedimentation rate, CK, Anti-Smith antibody, RNP Antibody, Anti-smooth muscle antibody, IgG, Mitochondrial antibodies  Highly positive ANA with weak positive SRP antibodies which can be associated with necrotizing myopathy.  He does not have any objective weakness on exam also denies progressive dysphagia falls or functional impairment.  Due to high titer serology and ILD association also check ESR, CK, Smith, RNP, and serum complements.  Interstitial lung disease ([place]) - Plan: C3 and C4, Sedimentation rate, CK, Anti-Smith antibody, RNP Antibody  UIP pattern  probably patient reports symptoms currently mild and with good pulmonary function testing.  No current clinical signs of systemic connective tissue disease on exam but testing additional serologic markers as detailed above.  Hepatic steatosis - Plan: C3 and C4, Sedimentation rate, CK, Anti-Smith antibody, RNP Antibody, Anti-smooth muscle antibody, IgG, Mitochondrial antibodies  Severe hepatic steatosis with apparently normal function we will also test for antimitochondrial and smooth muscle antibodies.  Orders: Orders Placed This Encounter  Procedures  . C3 and C4  . Sedimentation rate  . CK  . Anti-Smith antibody  . RNP Antibody  . Anti-smooth muscle antibody, IgG  . Mitochondrial antibodies   No orders of the defined types were placed in this encounter.   Follow-Up Instructions: No follow-ups on file.   Collier Salina, MD  Note - This record has been created using [employer].  Chart creation errors have been sought, but may not always  have been located. Such creation errors do not reflect on  the standard of medical care.

## 2020-09-22 LAB — RNP ANTIBODY: Ribonucleic Protein(ENA) Antibody, IgG: <1 AI

## 2020-09-22 LAB — C3 AND C4
C3 Complement: 149 mg/dL (ref 82–185)
C4 Complement: 30 mg/dL (ref 15–53)

## 2020-09-22 LAB — MITOCHONDRIAL ANTIBODIES: Mitochondrial M2 Ab, IgG: <=20 U

## 2020-09-22 LAB — ANTI-SMOOTH MUSCLE ANTIBODY, IGG: Actin (Smooth Muscle) Antibody (IGG): <20 U (ref <20)

## 2020-09-22 LAB — CK: Total CK: 36 U/L — ABNORMAL LOW (ref 44–196)

## 2020-09-22 LAB — SEDIMENTATION RATE: Sed Rate: 14 mm/h (ref 0–20)

## 2020-09-22 LAB — ANTI-SMITH ANTIBODY: ENA SM Ab Ser-aCnc: <1 AI

## 2020-09-25 NOTE — Progress Notes (Signed)
Additional lab tests do not show evidence of an active autoimmune disease. Based on this and our visit, I recommend following up with Mr. Brandon Oliver for your lung condition but no new medication or testing needed for any additional rheumatology issue at this time.

## 2020-10-03 DIAGNOSIS — K219 Gastro-esophageal reflux disease without esophagitis: Secondary | ICD-10-CM | POA: Diagnosis not present

## 2020-10-03 DIAGNOSIS — E782 Mixed hyperlipidemia: Secondary | ICD-10-CM | POA: Diagnosis not present

## 2020-10-03 DIAGNOSIS — I252 Old myocardial infarction: Secondary | ICD-10-CM | POA: Diagnosis not present

## 2020-10-09 DIAGNOSIS — Z0000 Encounter for general adult medical examination without abnormal findings: Secondary | ICD-10-CM | POA: Diagnosis not present

## 2020-10-09 DIAGNOSIS — E782 Mixed hyperlipidemia: Secondary | ICD-10-CM | POA: Diagnosis not present

## 2020-10-09 DIAGNOSIS — Z79899 Other long term (current) drug therapy: Secondary | ICD-10-CM | POA: Diagnosis not present

## 2020-10-11 ENCOUNTER — Other Ambulatory Visit: Payer: Self-pay

## 2020-10-11 ENCOUNTER — Encounter: Payer: Self-pay | Admitting: Pulmonary Disease

## 2020-10-11 ENCOUNTER — Ambulatory Visit: Payer: Medicare Other | Admitting: Pulmonary Disease

## 2020-10-11 VITALS — BP 122/64 | HR 103 | Temp 97.6°F | Ht 69.0 in | Wt 187.2 lb

## 2020-10-11 DIAGNOSIS — R768 Other specified abnormal immunological findings in serum: Secondary | ICD-10-CM

## 2020-10-11 DIAGNOSIS — J849 Interstitial pulmonary disease, unspecified: Secondary | ICD-10-CM

## 2020-10-11 NOTE — Patient Instructions (Signed)
I have reviewed your CT scan and lung function test Your lung function tests are normal, CT shows mild inflammation and scarring  After our discussion today we will continue to monitor this closely next Order high-res CT for ILD in December 2022 Follow-up in clinic after CT scan

## 2020-10-11 NOTE — Progress Notes (Signed)
Brandon Oliver    [id number]    [birth year]  Primary Care Physician:Ross, Dwyane Luo, MD  Referring Physician: [name], MD [place],  [place] [id number]  Chief complaint: Follow-up for interstitial lung disease  HPI: 75 year old with history of hypertension, MI, schizoaffective disorder Sent here for an abnormal chest x-ray that he got from his primary care.  He was told that he had "fluid, lung nodules and damage to the lungs" on the chest x-ray needed a pulmonary evaluation  Complains of mild dyspnea on exertion.  He is able to exercise and walks up to a mile without any issue every day.  Denies any cough, sputum production, fevers, chills.  He has history of coronary artery disease, heart failure and follows with Dr. 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 Exposures: No known exposures.  No mold, hot tub, Jacuzzi Smoking history: Never smoker Travel history: Originally from [place].  However travel to [place] in October 2021 Relevant family history: No significant family history of lung disease.  Interim history: Patient is here for review of CT and lab tests. He has been referred to rheumatology for elevated ANA.  Evaluated by Dr. Benjamine Mola who did not feel he has any autoimmune disease  States that breathing is stable with mild dyspnea on exertion with no change since last time.  Outpatient Encounter Medications as of 10/11/2020  Medication Sig  . aspirin 81 MG tablet Take 81 mg by mouth daily.  . carvedilol (COREG) 3.125 MG tablet Take 3.125 mg by mouth 2 (two) times daily with a meal.   . Coenzyme Q10 (COQ10) 100 MG CAPS Take 100 mg by mouth daily.  . Garlic 4782 MG CAPS See admin instructions.  . Multiple Vitamin (MULTIVITAMIN) tablet Take 1 tablet by mouth daily.  . Omega-3 Fatty Acids (FISH OIL) 1000 MG CAPS Take 1,000 mg by mouth daily.  . risperiDONE (RISPERDAL) 1 MG tablet  Take 1 tablet (1 mg total) by mouth at bedtime.  . rosuvastatin (CRESTOR) 20 MG tablet Take 20 mg by mouth daily.  . sertraline (ZOLOFT) 100 MG tablet Take 1 tablet (100 mg total) by mouth daily.  . [DISCONTINUED] omeprazole (PRILOSEC) 40 MG capsule Take 40 mg by mouth daily.   No facility-administered encounter medications on file as of 10/11/2020.    Physical Exam: Blood pressure 124/62, pulse 77, temperature (!) 97.5 F (36.4 C), temperature source Skin, height 5\' 9"  (1.753 m), weight 186 lb 9.6 oz (84.6 kg), SpO2 98 %. Gen:      No acute distress HEENT:  EOMI, sclera anicteric Neck:     No masses; no thyromegaly Lungs:    Clear to auscultation bilaterally; normal respiratory effort CV:         Regular rate and rhythm; no murmurs Abd:      + bowel sounds; soft, non-tender; no palpable masses, no distension Ext:    No edema; adequate peripheral perfusion Skin:      Warm and dry; no rash Neuro: alert and oriented x 3 Psych: normal mood and affect  Data Reviewed: Imaging: Chest x-ray 02/22/2016-mild interstitial changes  Chest x-ray from primary care 06/06/2020- pulmonary vascular congestion with bibasal airspace disease/atelectasis.    High-resolution CT 07/25/2020-peripheral groundglass with septal thickening, mild bronchiectasis and probable UIP pattern.  Aortic and three-vessel coronary atherosclerosis  PFTs: 09/05/2020 FVC 2.49 [4%], FEV1 2.69 [90%], F/F  74, TLC 5.4 [79%], DLCO 22.25 [90%] Normal test  Labs: CTD serologies 08/02/2020-ANA 1:1280, nuclear, SRP weakly positive  Assessment:  Interstitial lung disease I have reviewed CT which shows probable UIP pattern pulmonary fibrosis.  I note that he had a chest x-ray in 2017 which showed mild interstitial changes.  Though he has high ANA and positive SRP rheumatology feels that there is no evidence of autoimmune process  Symptomatically he feels well with no issues and PFTs are normal I discussed treatment options with him in  detail today.  We could consider initiation of antifibrotics but he has Karlene Lineman and is concerned about side effects on his liver.  He would like to hold off any work-up such as biopsy or treatment for now and monitor next  Repeat high-res CT in December 2022 and reevaluate  Plan/Recommendations: High-res CT December 2022  Marshell Garfinkel MD [facility] 10/11/2020, 11:02 AM  CC: [name], MD

## 2020-10-19 ENCOUNTER — Encounter (INDEPENDENT_AMBULATORY_CARE_PROVIDER_SITE_OTHER): Payer: Medicare Other | Admitting: Ophthalmology

## 2020-10-23 ENCOUNTER — Other Ambulatory Visit (HOSPITAL_COMMUNITY): Payer: Self-pay | Admitting: Psychiatry

## 2020-10-23 DIAGNOSIS — F419 Anxiety disorder, unspecified: Secondary | ICD-10-CM

## 2020-10-23 DIAGNOSIS — F250 Schizoaffective disorder, bipolar type: Secondary | ICD-10-CM

## 2020-10-26 ENCOUNTER — Encounter (INDEPENDENT_AMBULATORY_CARE_PROVIDER_SITE_OTHER): Payer: Medicare Other | Admitting: Ophthalmology

## 2020-10-26 ENCOUNTER — Other Ambulatory Visit: Payer: Self-pay

## 2020-10-26 DIAGNOSIS — H43813 Vitreous degeneration, bilateral: Secondary | ICD-10-CM | POA: Diagnosis not present

## 2020-10-26 DIAGNOSIS — D3132 Benign neoplasm of left choroid: Secondary | ICD-10-CM

## 2020-10-26 DIAGNOSIS — H33301 Unspecified retinal break, right eye: Secondary | ICD-10-CM | POA: Diagnosis not present

## 2020-10-26 DIAGNOSIS — H2513 Age-related nuclear cataract, bilateral: Secondary | ICD-10-CM | POA: Diagnosis not present

## 2020-11-02 ENCOUNTER — Encounter (HOSPITAL_COMMUNITY): Payer: Self-pay | Admitting: Psychiatry

## 2020-11-02 ENCOUNTER — Other Ambulatory Visit: Payer: Self-pay

## 2020-11-02 ENCOUNTER — Telehealth (INDEPENDENT_AMBULATORY_CARE_PROVIDER_SITE_OTHER): Payer: Medicare Other | Admitting: Psychiatry

## 2020-11-02 DIAGNOSIS — L918 Other hypertrophic disorders of the skin: Secondary | ICD-10-CM | POA: Diagnosis not present

## 2020-11-02 DIAGNOSIS — L821 Other seborrheic keratosis: Secondary | ICD-10-CM | POA: Diagnosis not present

## 2020-11-02 DIAGNOSIS — L570 Actinic keratosis: Secondary | ICD-10-CM | POA: Diagnosis not present

## 2020-11-02 DIAGNOSIS — F419 Anxiety disorder, unspecified: Secondary | ICD-10-CM | POA: Diagnosis not present

## 2020-11-02 DIAGNOSIS — D485 Neoplasm of uncertain behavior of skin: Secondary | ICD-10-CM | POA: Diagnosis not present

## 2020-11-02 DIAGNOSIS — F250 Schizoaffective disorder, bipolar type: Secondary | ICD-10-CM

## 2020-11-02 DIAGNOSIS — D1801 Hemangioma of skin and subcutaneous tissue: Secondary | ICD-10-CM | POA: Diagnosis not present

## 2020-11-02 DIAGNOSIS — L905 Scar conditions and fibrosis of skin: Secondary | ICD-10-CM | POA: Diagnosis not present

## 2020-11-02 DIAGNOSIS — L218 Other seborrheic dermatitis: Secondary | ICD-10-CM | POA: Diagnosis not present

## 2020-11-02 MED ORDER — RISPERIDONE 1 MG PO TABS: 1.0000 mg | ORAL_TABLET | Freq: Every day | ORAL | 0 refills | Status: DC

## 2020-11-02 MED ORDER — SERTRALINE HCL 100 MG PO TABS: 100.0000 mg | ORAL_TABLET | Freq: Every day | ORAL | 0 refills | Status: DC

## 2020-11-02 NOTE — Progress Notes (Signed)
Virtual Visit via Telephone Note  I connected with Brandon Oliver on 11/02/20 at 10:40 AM EDT by telephone and verified that I am speaking with the correct person using two identifiers.  Location: Patient: Home Provider: Home Office   I discussed the limitations, risks, security and privacy concerns of performing an evaluation and management service by telephone and the availability of in person appointments. I also discussed with the patient that there may be a patient responsible charge related to this service. The patient expressed understanding and agreed to proceed.   History of Present Illness: Patient is evaluated by phone session.  He is stable on his current medication.  Recently he has blood work for autoimmune disease as patient has abnormal chest x-ray of the lungs.  Patient was told everything is looking for and reviewed is not active.  Patient has history of fatty liver and he is seeing physician for that.  Patient reported his paranoia is under control.  He does not feel worsening of symptoms.  He sleeps good.  He denies any mania, psychosis, poor impulse control or any agitation.  He tried to keep himself busy helping the parents and seems.  He has no tremors shakes or any EPS.  His appetite is okay and his weight is stable.  Energy level is okay.  We have discussed further reducing Risperdal but he is reluctant because he believes current medicine is working and does not want to think about it.  Past Psychiatric History: H/O inpatientin 2005due topsychosis and paranoia at behavioral [place].  Recent Results (from the past 2160 hour(s))  SARS CORONAVIRUS 2 (TAT 6-24 HRS) Nasopharyngeal Nasopharyngeal Swab     Status: None   Collection Time: 09/01/20 10:54 AM   Specimen: Nasopharyngeal Swab  Result Value Ref Range   SARS Coronavirus 2 NEGATIVE NEGATIVE    Comment: (NOTE) SARS-CoV-2 target nucleic acids are NOT DETECTED.  The SARS-CoV-2 RNA is generally  detectable in upper and lower respiratory specimens during the acute phase of infection. Negative results do not preclude SARS-CoV-2 infection, do not rule out co-infections with other pathogens, and should not be used as the sole basis for treatment or other patient management decisions. Negative results must be combined with clinical observations, patient history, and epidemiological information. The expected result is Negative.  Fact Sheet for Patients: SugarRoll.be  Fact Sheet for Healthcare Providers: https://www.woods-mathews.com/  This test is not yet approved or cleared by the [place] FDA and  has been authorized for detection and/or diagnosis of SARS-CoV-2 by FDA under an Emergency Use Authorization (EUA). This EUA will remain  in effect (meaning this test can be used) for the duration of the COVID-19 declaration under Se ction 564(b)(1) of the Act, 21 U.S.C. section 360bbb-3(b)(1), unless the authorization is terminated or revoked sooner.  Performed at [facility], [place] [address], [place], [place] [address]   Pulmonary function test     Status: None   Collection Time: 09/05/20 11:54 AM  Result Value Ref Range   FVC-Pre 3.43 L   FVC-%Pred-Pre 83 %   FVC-Post 3.49 L   FVC-%Pred-Post 84 %   FVC-%Change-Post 1 %   FEV1-Pre 2.55 L   FEV1-%Pred-Pre 85 %   FEV1-Post 2.69 L   FEV1-%Pred-Post 90 %   FEV1-%Change-Post 5 %   FEV6-Pre 3.43 L   FEV6-%Pred-Pre 88 %   FEV6-Post 3.49 L   FEV6-%Pred-Post 90 %   FEV6-%Change-Post 1 %   Pre FEV1/FVC ratio 74 %  FEV1FVC-%Pred-Pre 102 %   Post FEV1/FVC ratio 77 %   FEV1FVC-%Change-Post 3 %   Pre FEV6/FVC Ratio 100 %   FEV6FVC-%Pred-Pre 106 %   Post FEV6/FVC ratio 100 %   FEV6FVC-%Pred-Post 106 %   FEF 25-75 Pre 1.95 L/sec   FEF2575-%Pred-Pre 89 %   FEF 25-75 Post 2.39 L/sec   FEF2575-%Pred-Post 109 %   FEF2575-%Change-Post 22 %   RV 2.12 L   RV % pred 85 %   TLC  5.42 L   TLC % pred 79 %   DLCO unc 22.25 ml/min/mmHg   DLCO unc % pred 90 %   DLCO cor 22.25 ml/min/mmHg   DLCO cor % pred 90 %   DL/VA 4.03 ml/min/mmHg/L   DL/VA % pred 100 %  C3 and C4     Status: None   Collection Time: 09/20/20 10:51 AM  Result Value Ref Range   C3 Complement 149 82 - 185 mg/dL   C4 Complement 30 15 - 53 mg/dL  Sedimentation rate     Status: None   Collection Time: 09/20/20 10:51 AM  Result Value Ref Range   Sed Rate 14 0 - 20 mm/h  CK     Status: Abnormal   Collection Time: 09/20/20 10:51 AM  Result Value Ref Range   Total CK 36 (L) 44 - 196 U/L  Anti-Smith antibody     Status: None   Collection Time: 09/20/20 10:51 AM  Result Value Ref Range   ENA SM Ab Ser-aCnc <1.0 NEG <1.0 NEG AI  RNP Antibody     Status: None   Collection Time: 09/20/20 10:51 AM  Result Value Ref Range   Ribonucleic Protein(ENA) Antibody, IgG <1.0 NEG <1.0 NEG AI  Anti-smooth muscle antibody, IgG     Status: None   Collection Time: 09/20/20 10:51 AM  Result Value Ref Range   Actin (Smooth Muscle) Antibody (IGG) <20 <20 U    Comment: . Reference Range:    <20 U: Negative >or=20 U: Positive . Marland Kitchen Antibodies recognizing actin are the main component of smooth muscle antibodies associated with auto- immune liver disease. Actin antibodies are found in approximately 75% of patients with autoimmune hepatitis (AIH) type 1, approximately 65% of patients with autoimmune cholangitis, approximately 30% of patients with primary biliary cirrhosis and approximately 2% of healthy controls. High values are closely correlated with AIH type 1. .   Mitochondrial antibodies     Status: None   Collection Time: 09/20/20 10:51 AM  Result Value Ref Range   Mitochondrial M2 Ab, IgG < OR = 20.0 U    Comment:                 Reference Range                 Negative:  < or = 20.0                 Equivocal: 20.1 - 24.9                 Positive:  > or = 25.0      Psychiatric Specialty  Exam: Physical Exam  Review of Systems  Weight 187 lb (84.8 kg).There is no height or weight on file to calculate BMI.  General Appearance: NA  Eye Contact:  NA  Speech:  Clear and Coherent  Volume:  Normal  Mood:  Euthymic  Affect:  NA  Thought Process:  Goal Directed  Orientation:  Full (Time, Place,  and Person)  Thought Content:  Logical  Suicidal Thoughts:  No  Homicidal Thoughts:  No  Memory:  Immediate;   Good Recent;   Good Remote;   Good  Judgement:  Good  Insight:  Present  Psychomotor Activity:  NA  Concentration:  Concentration: Good and Attention Span: Good  Recall:  Good  Fund of Knowledge:  Good  Language:  Good  Akathisia:  No  Handed:  Right  AIMS (if indicated):     Assets:  Communication Skills Desire for Improvement Housing Resilience Social Support Transportation  ADL's:  Intact  Cognition:  WNL  Sleep:   ok      Assessment and Plan: Schizoaffective disorder, bipolar type.  Anxiety.  I reviewed blood work results.  He is relieved after the blood work has no major concern was told by his physician.Patient is taking the Risperdal and Zoloft and does not want to change the medication.  Continue Zoloft 100 mg daily and Risperdal 1 mg at bedtime.  Recommended to call us back with any question or any concern.  Follow-up in 3 months.  Follow Up Instructions:    I discussed the assessment and treatment plan with the patient. The patient was provided an opportunity to ask questions and all were answered. The patient agreed with the plan and demonstrated an understanding of the instructions.   The patient was advised to call back or seek an in-person evaluation if the symptoms worsen or if the condition fails to improve as anticipated.  I provided 14 minutes of non-face-to-face time during this encounter.   [name], MD

## 2020-11-27 DIAGNOSIS — K219 Gastro-esophageal reflux disease without esophagitis: Secondary | ICD-10-CM | POA: Diagnosis not present

## 2020-11-27 DIAGNOSIS — I252 Old myocardial infarction: Secondary | ICD-10-CM | POA: Diagnosis not present

## 2020-11-27 DIAGNOSIS — E782 Mixed hyperlipidemia: Secondary | ICD-10-CM | POA: Diagnosis not present

## 2020-12-19 ENCOUNTER — Ambulatory Visit: Payer: Medicare Other | Admitting: Podiatry

## 2021-01-10 DIAGNOSIS — L97911 Non-pressure chronic ulcer of unspecified part of right lower leg limited to breakdown of skin: Secondary | ICD-10-CM | POA: Diagnosis not present

## 2021-01-24 DIAGNOSIS — E782 Mixed hyperlipidemia: Secondary | ICD-10-CM | POA: Diagnosis not present

## 2021-01-24 DIAGNOSIS — Z79899 Other long term (current) drug therapy: Secondary | ICD-10-CM | POA: Diagnosis not present

## 2021-01-28 ENCOUNTER — Other Ambulatory Visit (HOSPITAL_COMMUNITY): Payer: Self-pay | Admitting: Psychiatry

## 2021-01-28 DIAGNOSIS — F250 Schizoaffective disorder, bipolar type: Secondary | ICD-10-CM

## 2021-02-02 ENCOUNTER — Telehealth (INDEPENDENT_AMBULATORY_CARE_PROVIDER_SITE_OTHER): Payer: Medicare Other | Admitting: Psychiatry

## 2021-02-02 ENCOUNTER — Other Ambulatory Visit: Payer: Self-pay

## 2021-02-02 ENCOUNTER — Encounter (HOSPITAL_COMMUNITY): Payer: Self-pay | Admitting: Psychiatry

## 2021-02-02 DIAGNOSIS — F250 Schizoaffective disorder, bipolar type: Secondary | ICD-10-CM | POA: Diagnosis not present

## 2021-02-02 DIAGNOSIS — F419 Anxiety disorder, unspecified: Secondary | ICD-10-CM

## 2021-02-02 MED ORDER — SERTRALINE HCL 100 MG PO TABS: 100.0000 mg | ORAL_TABLET | Freq: Every day | ORAL | 1 refills | Status: DC

## 2021-02-02 MED ORDER — RISPERIDONE 1 MG PO TABS: 1.0000 mg | ORAL_TABLET | Freq: Every day | ORAL | 1 refills | Status: DC

## 2021-02-02 NOTE — Progress Notes (Signed)
Virtual Visit via Telephone Note  I connected with Brandon Oliver on 02/02/21 at 10:40 AM EDT by telephone and verified that I am speaking with the correct person using two identifiers.  Location: Patient: Home Provider: Home Office   I discussed the limitations, risks, security and privacy concerns of performing an evaluation and management service by telephone and the availability of in person appointments. I also discussed with the patient that there may be a patient responsible charge related to this service. The patient expressed understanding and agreed to proceed.   History of Present Illness: Patient is evaluated by phone session.  He is taking his medication as prescribed.  He tried to keep himself busy.  He denies any paranoia, hallucination or any panic attack.  He had a blood work last week at [facility] to track his fatty liver enzymes.  He do not know the results but he also did not receive any phone calls so he believes everything will be fine.  His appetite is okay.  His weight is stable.  He has no tremor or shakes or any EPS.  We have discussed reducing further Risperdal but patient is comfortable with the current dose.  He used to take a very high dose which was gradually cut it down.  Patient denies worsening of symptoms and denies any anxiety or any panic attack.  He denies drinking or using any illegal substances.  His energy level is good.   Past Psychiatric History: H/O inpatient in 2005 due to psychosis and paranoia at behavioral [place].    Psychiatric Specialty Exam: Physical Exam  Review of Systems  Weight 189 lb (85.7 kg).There is no height or weight on file to calculate BMI.  General Appearance: NA  Eye Contact:  NA  Speech:  Clear and Coherent  Volume:  Normal  Mood:  Euthymic  Affect:  NA  Thought Process:  Goal Directed  Orientation:  Full (Time, Place, and Person)  Thought Content:  WDL  Suicidal Thoughts:  No  Homicidal Thoughts:  No   Memory:  Immediate;   Good Recent;   Good Remote;   Good  Judgement:  Good  Insight:  Present  Psychomotor Activity:  NA  Concentration:  Concentration: Good and Attention Span: Good  Recall:  Good  Fund of Knowledge:  Good  Language:  Good  Akathisia:  No  Handed:  Right  AIMS (if indicated):     Assets:  Communication Skills Desire for Improvement Housing Resilience Social Support Transportation  ADL's:  Intact  Cognition:  WNL  Sleep:   good      Assessment and Plan: Schizoaffective disorder, bipolar type.  Anxiety  Patient is stable on his current medication.  He has no tremors or shakes.  Continue Zoloft 100 mg daily and Risperdal 1 mg at bedtime.  Patient does not want to change the medication.  Recommended to call us back if is any question or any concern.  Follow-up in 3 months.  Follow Up Instructions:    I discussed the assessment and treatment plan with the patient. The patient was provided an opportunity to ask questions and all were answered. The patient agreed with the plan and demonstrated an understanding of the instructions.   The patient was advised to call back or seek an in-person evaluation if the symptoms worsen or if the condition fails to improve as anticipated.  I provided 16 minutes of non-face-to-face time during this encounter.   [name], MD

## 2021-02-05 DIAGNOSIS — K219 Gastro-esophageal reflux disease without esophagitis: Secondary | ICD-10-CM | POA: Diagnosis not present

## 2021-02-05 DIAGNOSIS — I252 Old myocardial infarction: Secondary | ICD-10-CM | POA: Diagnosis not present

## 2021-02-05 DIAGNOSIS — E782 Mixed hyperlipidemia: Secondary | ICD-10-CM | POA: Diagnosis not present

## 2021-02-27 DIAGNOSIS — M65332 Trigger finger, left middle finger: Secondary | ICD-10-CM | POA: Diagnosis not present

## 2021-02-28 ENCOUNTER — Ambulatory Visit: Payer: Medicare Other | Admitting: Podiatry

## 2021-02-28 ENCOUNTER — Encounter: Payer: Self-pay | Admitting: Podiatry

## 2021-02-28 ENCOUNTER — Other Ambulatory Visit: Payer: Self-pay

## 2021-02-28 DIAGNOSIS — M79676 Pain in unspecified toe(s): Secondary | ICD-10-CM

## 2021-02-28 DIAGNOSIS — B351 Tinea unguium: Secondary | ICD-10-CM | POA: Diagnosis not present

## 2021-02-28 NOTE — Progress Notes (Signed)
This patient presents to the office with chief complaint of long thick painful nails.  Patient says the nails are painful walking and wearing shoes.  This patient is unable to self treat.  This patient is unable to trim his nails since she is unable to reach his nails.  he presents to the office for preventative foot care services.  General Appearance  Alert, conversant and in no acute stress.  Vascular  Dorsalis pedis and posterior tibial  pulses are palpable  bilaterally.  Capillary return is within normal limits  bilaterally. Temperature is within normal limits  bilaterally.  Neurologic  Senn-Weinstein monofilament wire test within normal limits  bilaterally. Muscle power within normal limits bilaterally.  Nails Thick disfigured discolored nails with subungual debris  from hallux to fifth toes bilaterally. No evidence of bacterial infection or drainage bilaterally.  Orthopedic  No limitations of motion  feet .  No crepitus or effusions noted.  No bony pathology or digital deformities noted.  Skin  normotropic skin with no porokeratosis noted bilaterally.  No signs of infections or ulcers noted.     Onychomycosis  Nails  B/L.  Pain in right toes  Pain in left toes  Debridement of nails both feet followed trimming the nails with dremel tool.    RTC  6   months.   Alsace Dowd DPM  

## 2021-03-27 DIAGNOSIS — K760 Fatty (change of) liver, not elsewhere classified: Secondary | ICD-10-CM | POA: Diagnosis not present

## 2021-03-27 DIAGNOSIS — R198 Other specified symptoms and signs involving the digestive system and abdomen: Secondary | ICD-10-CM | POA: Diagnosis not present

## 2021-03-28 DIAGNOSIS — M65332 Trigger finger, left middle finger: Secondary | ICD-10-CM | POA: Diagnosis not present

## 2021-03-29 ENCOUNTER — Other Ambulatory Visit: Payer: Self-pay | Admitting: Family Medicine

## 2021-03-29 DIAGNOSIS — R198 Other specified symptoms and signs involving the digestive system and abdomen: Secondary | ICD-10-CM

## 2021-03-29 DIAGNOSIS — D1779 Benign lipomatous neoplasm of other sites: Secondary | ICD-10-CM

## 2021-04-03 DIAGNOSIS — R059 Cough, unspecified: Secondary | ICD-10-CM | POA: Diagnosis not present

## 2021-04-03 DIAGNOSIS — Z03818 Encounter for observation for suspected exposure to other biological agents ruled out: Secondary | ICD-10-CM | POA: Diagnosis not present

## 2021-04-03 DIAGNOSIS — J069 Acute upper respiratory infection, unspecified: Secondary | ICD-10-CM | POA: Diagnosis not present

## 2021-04-03 DIAGNOSIS — R051 Acute cough: Secondary | ICD-10-CM | POA: Diagnosis not present

## 2021-04-06 ENCOUNTER — Other Ambulatory Visit: Payer: Medicare Other

## 2021-04-10 DIAGNOSIS — E782 Mixed hyperlipidemia: Secondary | ICD-10-CM | POA: Diagnosis not present

## 2021-04-10 DIAGNOSIS — K219 Gastro-esophageal reflux disease without esophagitis: Secondary | ICD-10-CM | POA: Diagnosis not present

## 2021-04-10 DIAGNOSIS — I252 Old myocardial infarction: Secondary | ICD-10-CM | POA: Diagnosis not present

## 2021-04-20 ENCOUNTER — Ambulatory Visit
Admission: RE | Admit: 2021-04-20 | Discharge: 2021-04-20 | Disposition: A | Discharge status: Home / Self Care | Payer: Medicare Other | Source: Ambulatory Visit | Attending: Family Medicine | Admitting: Family Medicine

## 2021-04-20 DIAGNOSIS — R198 Other specified symptoms and signs involving the digestive system and abdomen: Secondary | ICD-10-CM

## 2021-04-20 DIAGNOSIS — R932 Abnormal findings on diagnostic imaging of liver and biliary tract: Secondary | ICD-10-CM | POA: Diagnosis not present

## 2021-04-20 IMAGING — US US ABDOMEN COMPLETE
1 series · 14 of 25 positions shown · non-contrast
Comparison: MRI [DATE]

CLINICAL DATA: Abnormal abdominal exam

EXAM:
ABDOMEN ULTRASOUND COMPLETE

[Series 1: us abdomen complete · 0.16mm/px · 14 of 91 slices shown]
[im 1/91]
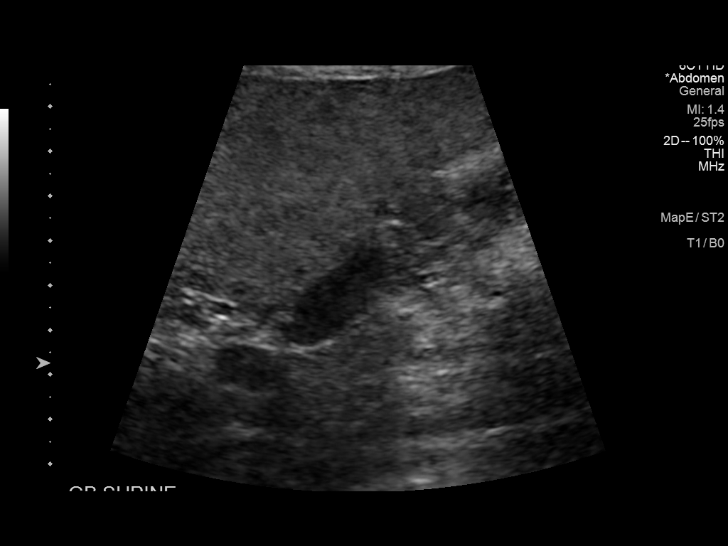
[im 8/91]
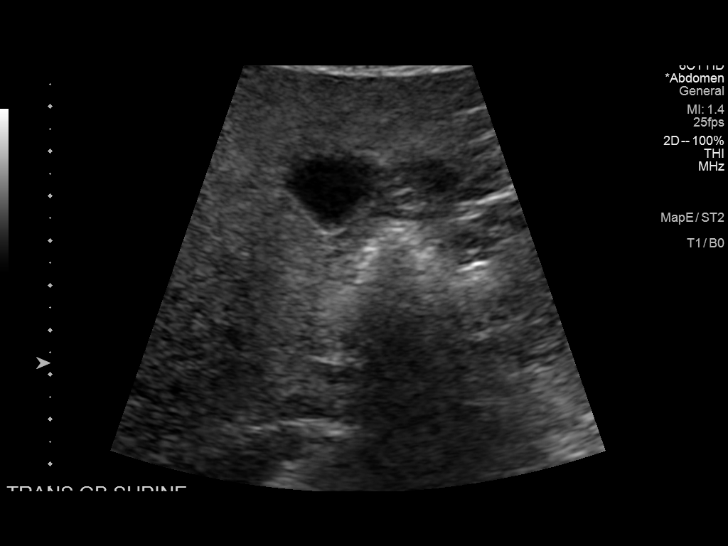
[im 16/91]
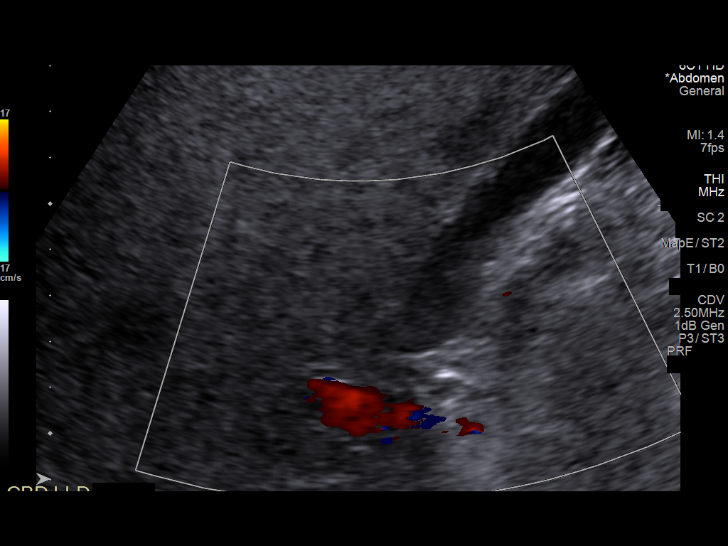
[im 23/91]
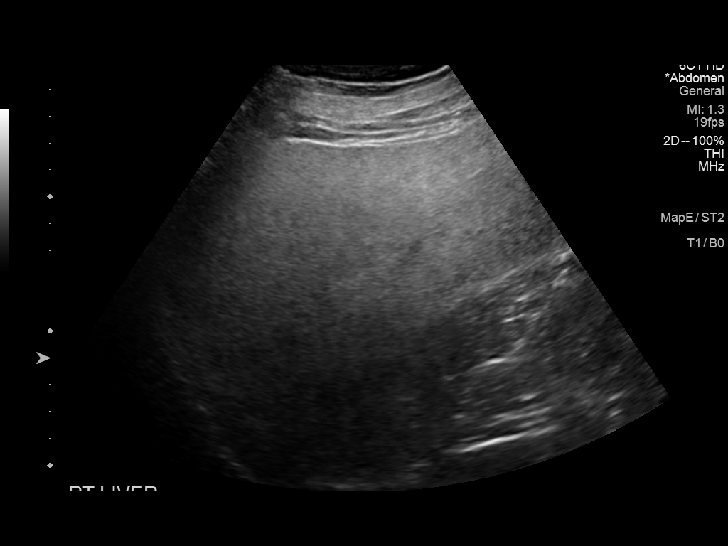
[im 31/91]
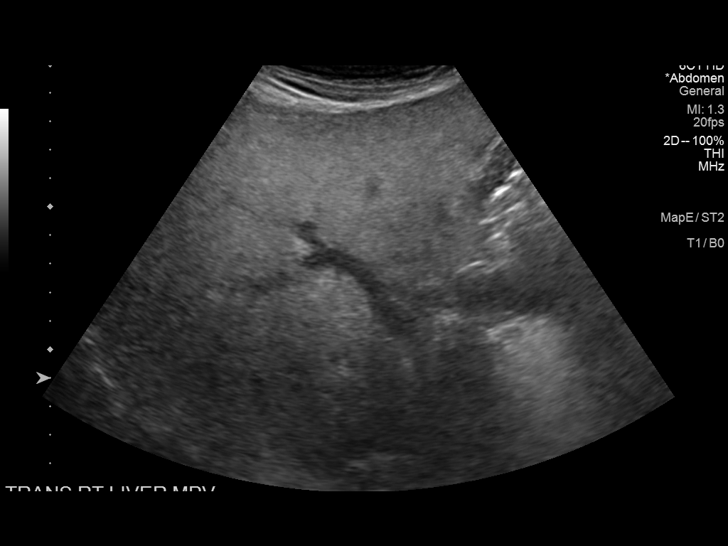
[im 34/91]
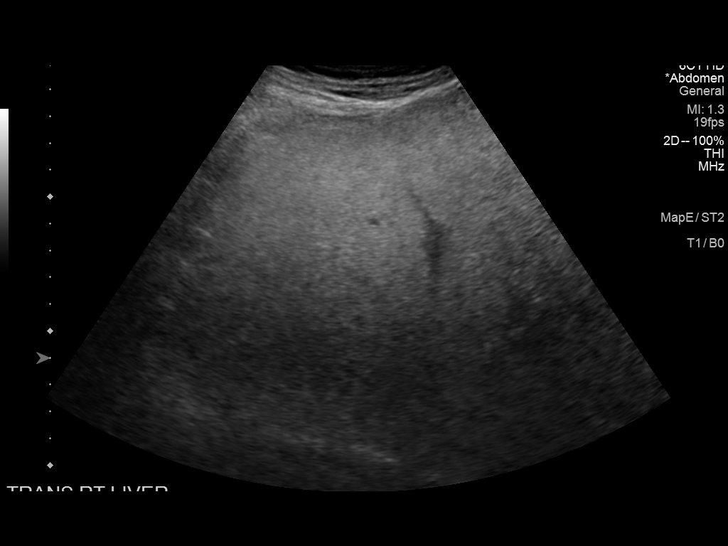
[im 42/91]
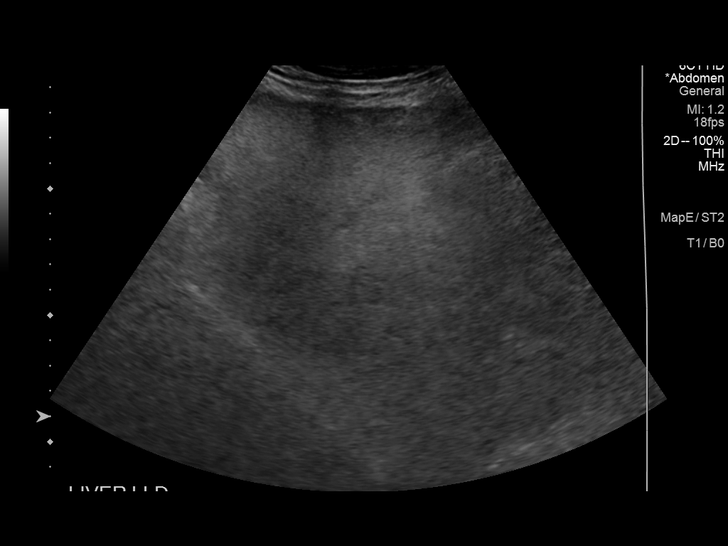
[im 49/91]
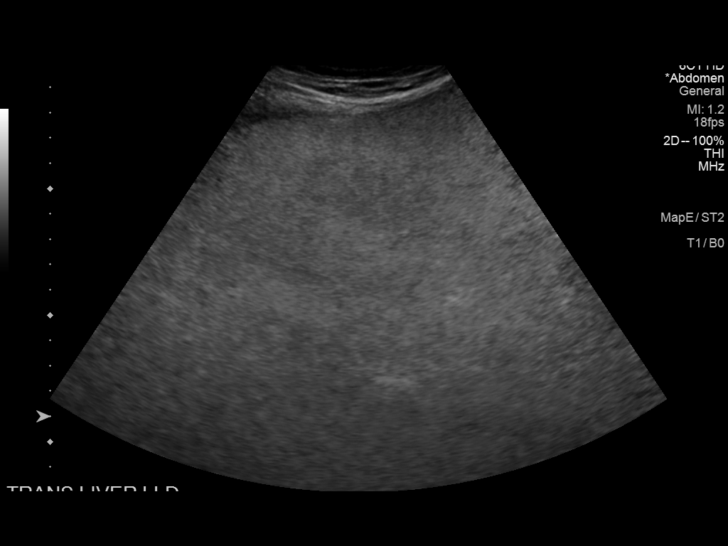
[im 57/91]
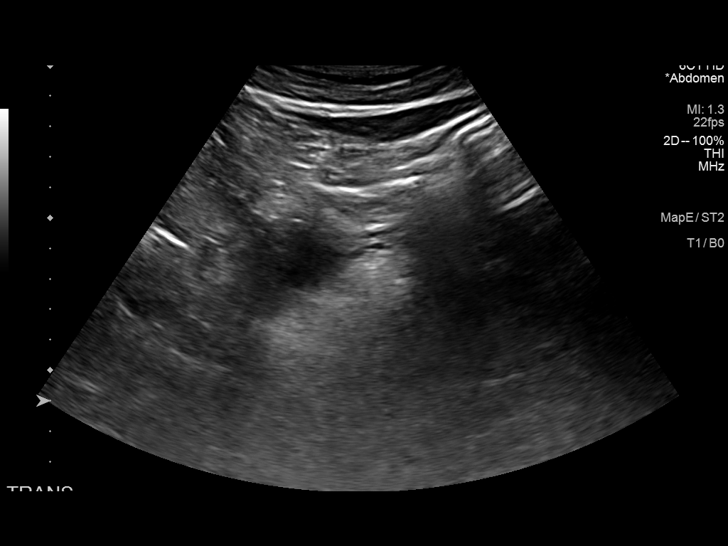
[im 61/91]
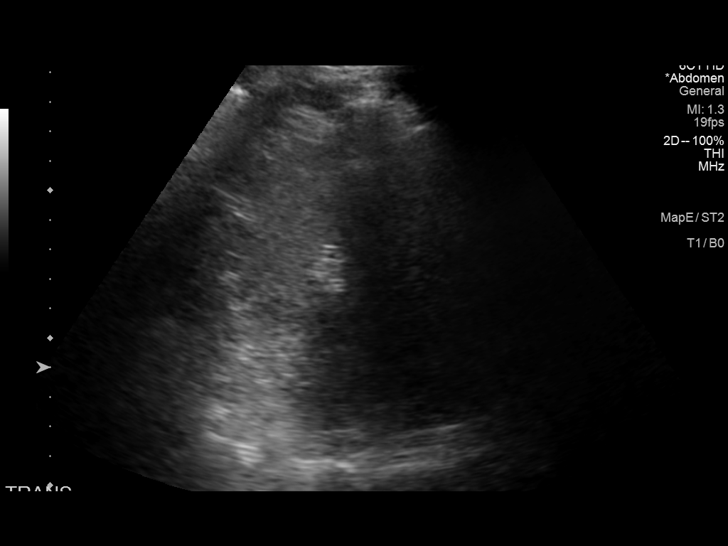
[im 68/91]
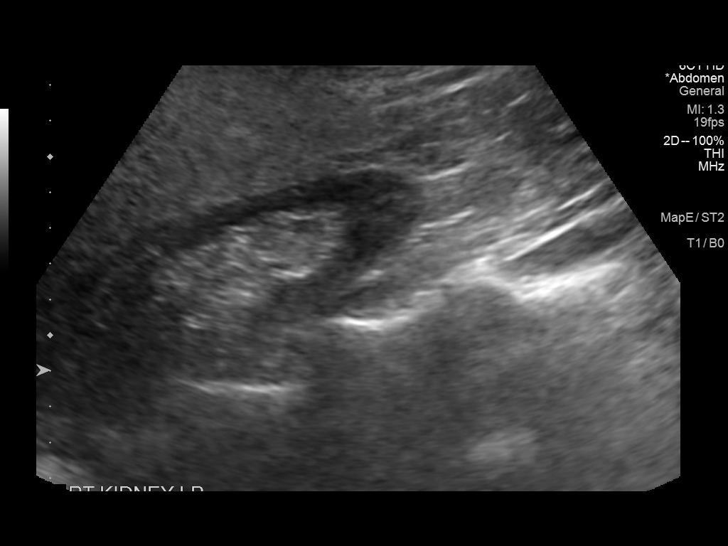
[im 76/91]
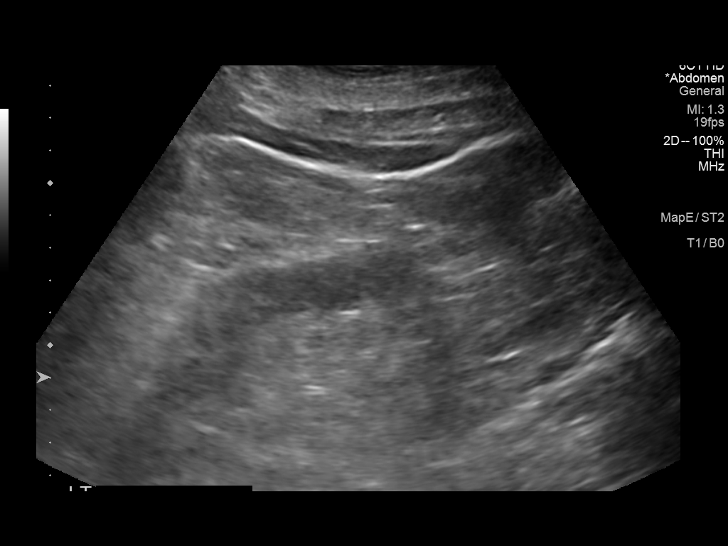
[im 83/91]
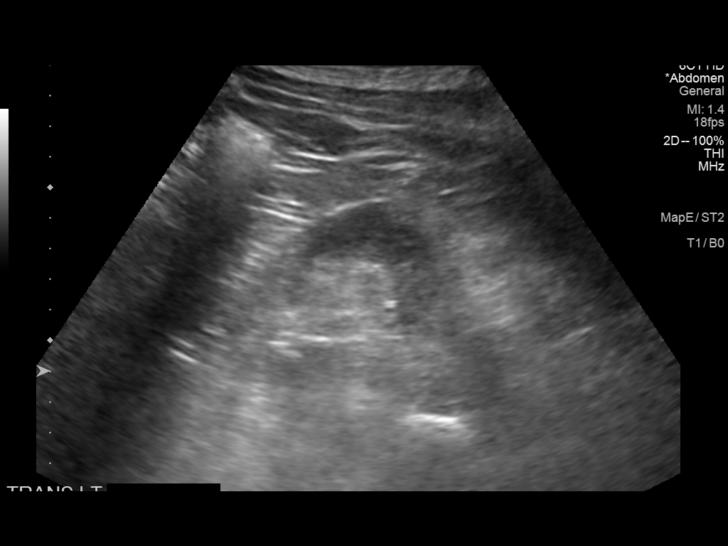
[im 91/91]
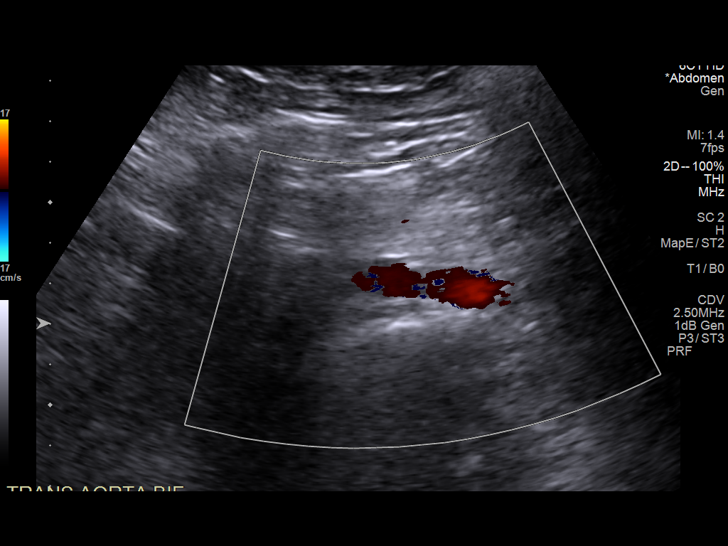

[14 of 25 positions shown; findings below may reference images not displayed]

FINDINGS: Gallbladder: No gallstones or wall thickening visualized. No
sonographic Murphy sign noted by sonographer.

Common bile duct: Diameter: 5 mm

Liver: Heterogenous echotexture, diffusely increased. No focal
hepatic mass lesion. Portal vein is patent on color Doppler imaging
with normal direction of blood flow towards the liver.

IVC: No abnormality visualized.

Pancreas: Visualized portion unremarkable.

Spleen: Size and appearance within normal limits.

Right Kidney: Length: 10.5 cm. Echogenicity within normal limits. No
mass or hydronephrosis visualized.

Left Kidney: Length: 12.2 cm. Echogenicity within normal limits. No
mass or hydronephrosis visualized.

Abdominal aorta: No aneurysm visualized.

Other findings: None.
IMPRESSION: 1. Heterogeneous, coarse hepatic echotexture with overall increased
echogenicity, consistent with steatosis. Difficult to exclude subtle
contour nodularity as may be seen with early changes of cirrhosis.
2. Otherwise negative abdominal ultrasound

## 2021-04-25 DIAGNOSIS — M65332 Trigger finger, left middle finger: Secondary | ICD-10-CM | POA: Diagnosis not present

## 2021-05-02 DIAGNOSIS — Z23 Encounter for immunization: Secondary | ICD-10-CM | POA: Diagnosis not present

## 2021-05-10 ENCOUNTER — Ambulatory Visit
Admission: RE | Admit: 2021-05-10 | Discharge: 2021-05-10 | Disposition: A | Discharge status: Home / Self Care | Payer: Medicare Other | Source: Ambulatory Visit | Attending: Family Medicine | Admitting: Family Medicine

## 2021-05-10 DIAGNOSIS — R1900 Intra-abdominal and pelvic swelling, mass and lump, unspecified site: Secondary | ICD-10-CM | POA: Diagnosis not present

## 2021-05-10 DIAGNOSIS — D1779 Benign lipomatous neoplasm of other sites: Secondary | ICD-10-CM

## 2021-05-10 DIAGNOSIS — R198 Other specified symptoms and signs involving the digestive system and abdomen: Secondary | ICD-10-CM

## 2021-05-10 IMAGING — US US ABDOMEN LIMITED
1 series · 14 of 22 positions shown · non-contrast
Comparison: [DATE] MRI.

CLINICAL DATA: Right abdominal wall palpable abnormality.

EXAM:
ULTRASOUND ABDOMEN LIMITED

[Series 1: us abdomen limited · 0.06mm/px · 22 acquisitions, 14 frames shown]
[im 1/22]
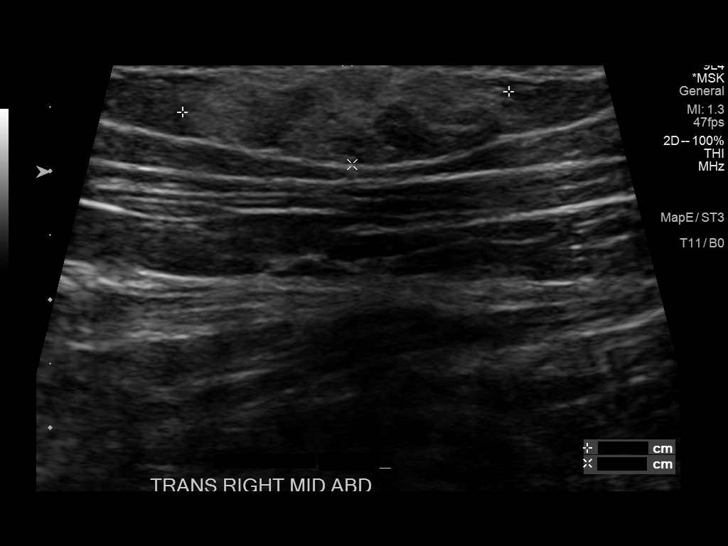
[im 3/22]
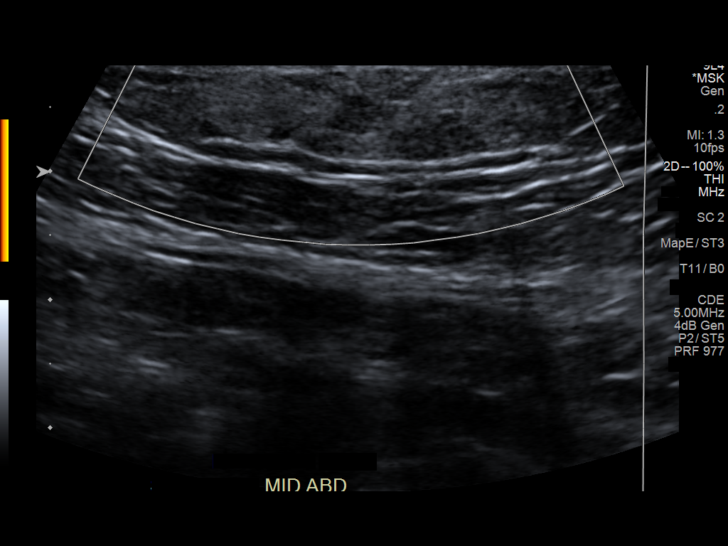
[im 4/22]
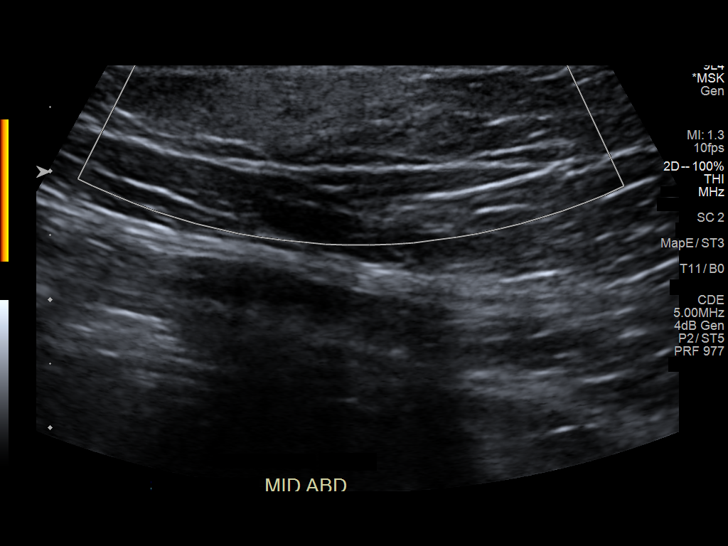
[im 6/22]
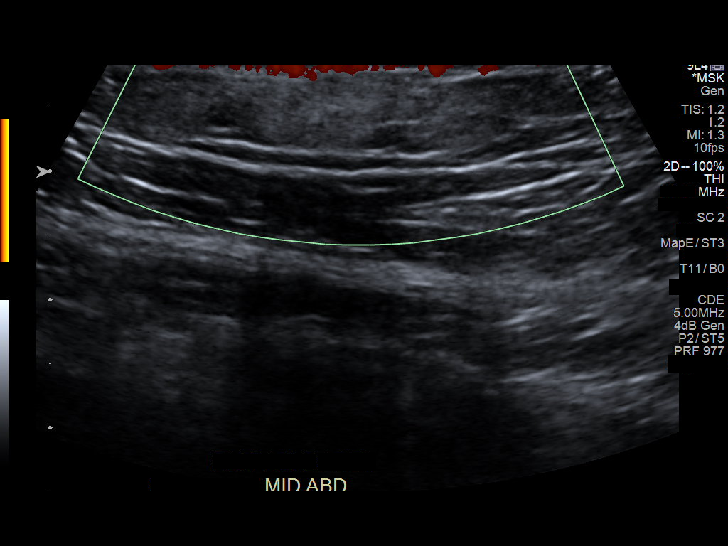
[im 8/22]
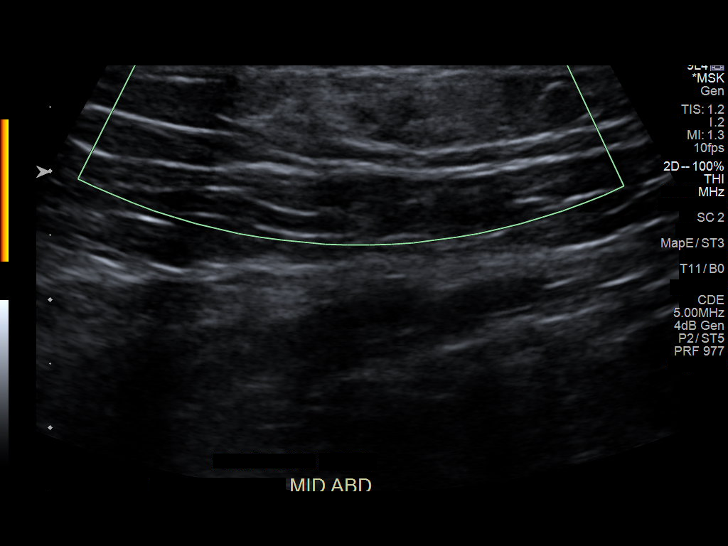
[im 9/22]
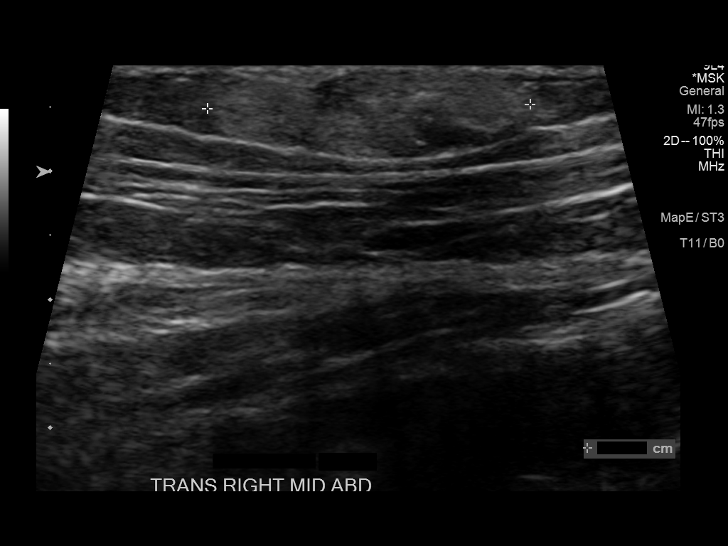
[im 11/22]
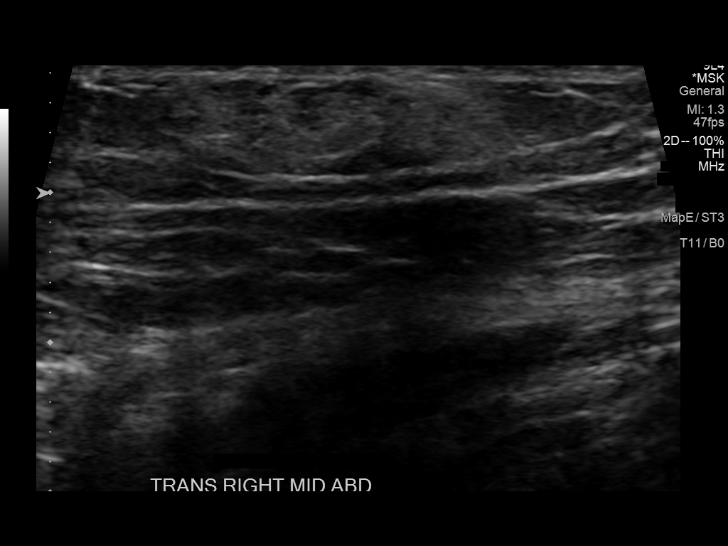
[im 12/22]
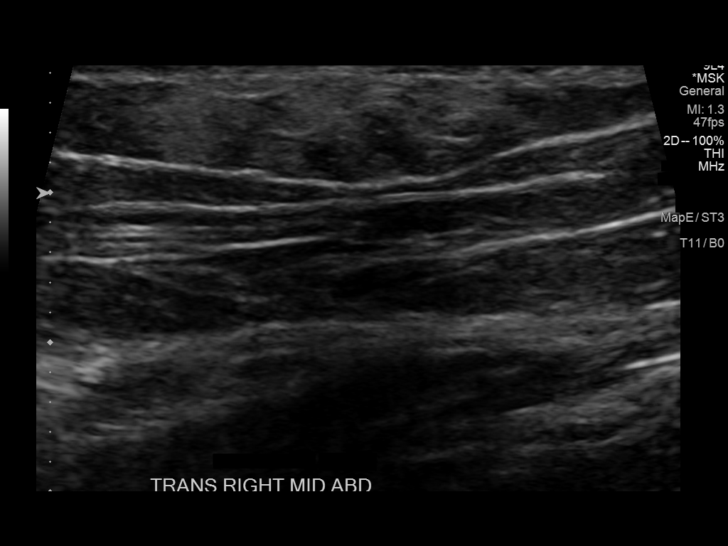
[im 14/22]
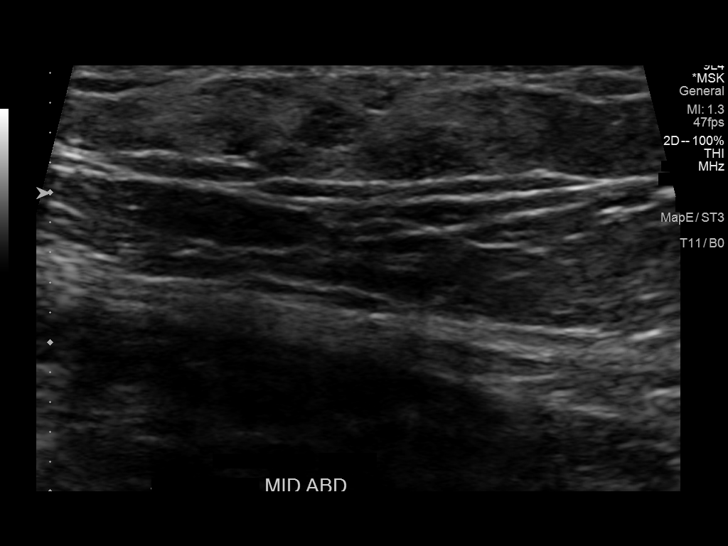
[im 15/22]
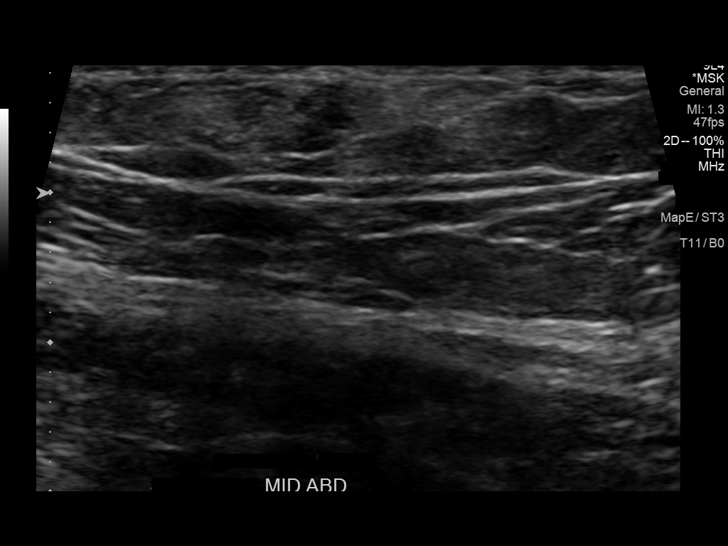
[im 17/22]
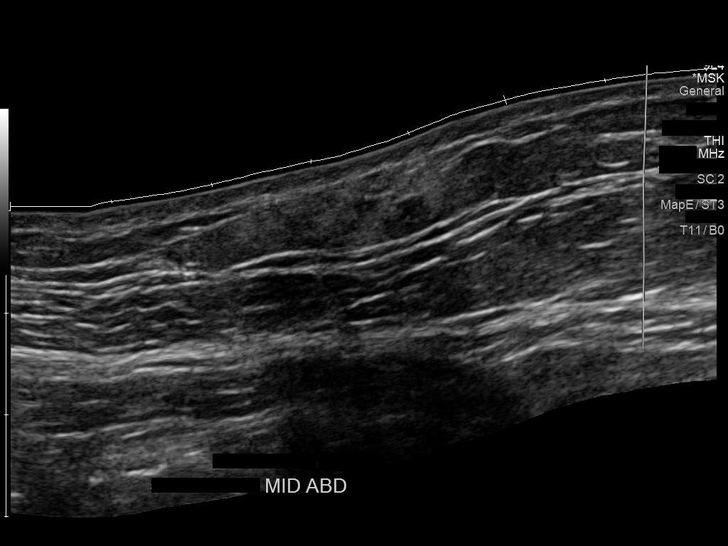
[im 19/22]
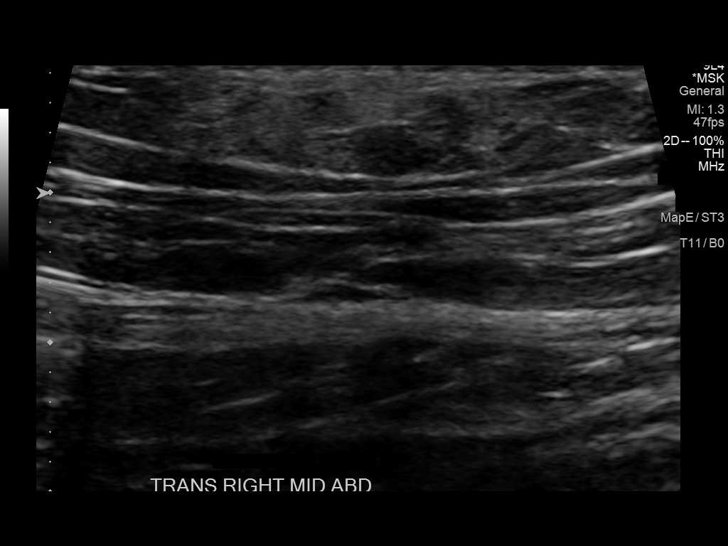
[im 20/22]
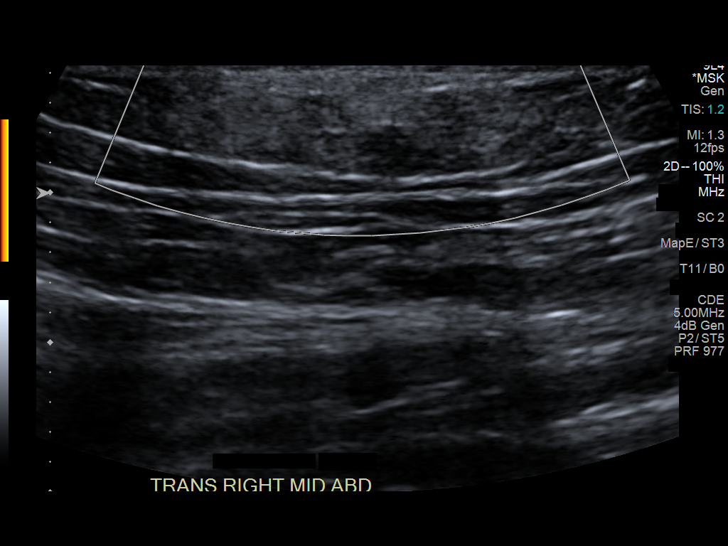
[im 22/22]
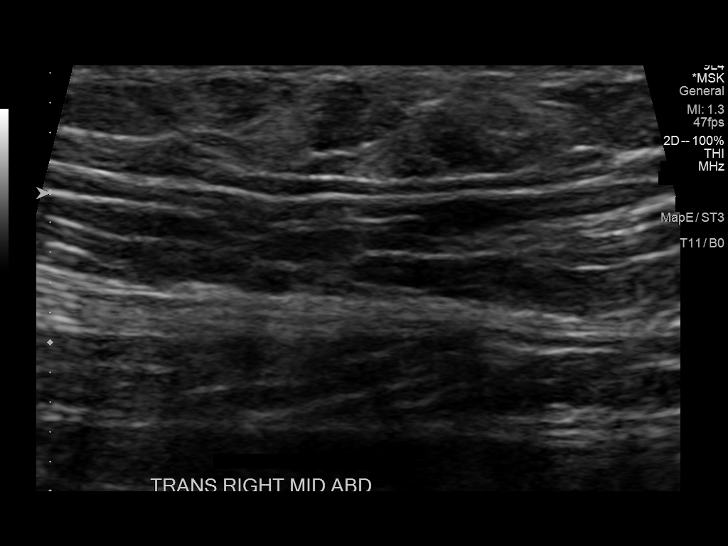

[14 of 22 positions shown; findings below may reference images not displayed]

FINDINGS: Corresponding to the palpable abnormality, within the superficial
right abdominal wall, is a 2.6 x 0.8 x 2.5 cm mildly heterogeneous,
relatively well-circumscribed hyperechoic lesion without significant
vascularity.
IMPRESSION: The palpable abnormality corresponds to a hyperechoic abdominal wall
lesion. Appearance is technically nonspecific but favored to
represent a lipoma.

## 2021-05-23 DIAGNOSIS — M65332 Trigger finger, left middle finger: Secondary | ICD-10-CM | POA: Diagnosis not present

## 2021-06-07 DIAGNOSIS — I252 Old myocardial infarction: Secondary | ICD-10-CM | POA: Diagnosis not present

## 2021-06-07 DIAGNOSIS — E782 Mixed hyperlipidemia: Secondary | ICD-10-CM | POA: Diagnosis not present

## 2021-06-07 DIAGNOSIS — K219 Gastro-esophageal reflux disease without esophagitis: Secondary | ICD-10-CM | POA: Diagnosis not present

## 2021-06-12 ENCOUNTER — Telehealth: Payer: Self-pay | Admitting: Pulmonary Disease

## 2021-06-12 NOTE — Telephone Encounter (Signed)
Spoke with the pt  He is due for HRCT 07/26/22  This was already ordered  He has several dates he is unable to do and wants scheduler to call him before making appt  Will forward to the [facility]

## 2021-06-14 DIAGNOSIS — K760 Fatty (change of) liver, not elsewhere classified: Secondary | ICD-10-CM | POA: Diagnosis not present

## 2021-06-19 NOTE — Telephone Encounter (Signed)
Pt scheduled for HRCT 12/27. Closing encounter.

## 2021-07-24 ENCOUNTER — Other Ambulatory Visit: Payer: Medicare Other

## 2021-07-25 ENCOUNTER — Other Ambulatory Visit (HOSPITAL_COMMUNITY): Payer: Self-pay | Admitting: Psychiatry

## 2021-07-25 DIAGNOSIS — F250 Schizoaffective disorder, bipolar type: Secondary | ICD-10-CM

## 2021-07-28 ENCOUNTER — Other Ambulatory Visit (HOSPITAL_COMMUNITY): Payer: Self-pay | Admitting: Psychiatry

## 2021-07-28 DIAGNOSIS — F250 Schizoaffective disorder, bipolar type: Secondary | ICD-10-CM

## 2021-07-28 DIAGNOSIS — F419 Anxiety disorder, unspecified: Secondary | ICD-10-CM

## 2021-08-02 ENCOUNTER — Ambulatory Visit: Payer: Medicare Other | Admitting: Interventional Cardiology

## 2021-08-06 ENCOUNTER — Encounter (HOSPITAL_COMMUNITY): Payer: Self-pay | Admitting: Psychiatry

## 2021-08-06 ENCOUNTER — Telehealth (HOSPITAL_BASED_OUTPATIENT_CLINIC_OR_DEPARTMENT_OTHER): Payer: Medicare Other | Admitting: Psychiatry

## 2021-08-06 ENCOUNTER — Other Ambulatory Visit: Payer: Self-pay

## 2021-08-06 DIAGNOSIS — F250 Schizoaffective disorder, bipolar type: Secondary | ICD-10-CM | POA: Diagnosis not present

## 2021-08-06 DIAGNOSIS — F419 Anxiety disorder, unspecified: Secondary | ICD-10-CM | POA: Diagnosis not present

## 2021-08-06 MED ORDER — RISPERIDONE 1 MG PO TABS: 1.0000 mg | ORAL_TABLET | Freq: Every day | ORAL | 1 refills | Status: DC

## 2021-08-06 MED ORDER — SERTRALINE HCL 100 MG PO TABS: 100.0000 mg | ORAL_TABLET | Freq: Every day | ORAL | 1 refills | Status: DC

## 2021-08-06 NOTE — Progress Notes (Signed)
Virtual Visit via Telephone Note  I connected with Brandon Oliver on 08/06/21 at 10:00 AM EST by telephone and verified that I am speaking with the correct person using two identifiers.  Location: Patient: Home Provider: Home Office   I discussed the limitations, risks, security and privacy concerns of performing an evaluation and management service by telephone and the availability of in person appointments. I also discussed with the patient that there may be a patient responsible charge related to this service. The patient expressed understanding and agreed to proceed.   History of Present Illness: Patient is evaluated by phone session.  He is taking his medication as prescribed.  He feels things are going very well.  He had a good Christmas.  He spent time with the family of his deceased friend.  He tried to keep himself busy.  He has good social network.  He denies any paranoia, hallucination or any suicidal thoughts.  He sleeps good.  Denies any panic attack.  He had a liver ultrasound and he was told everything is normal but he is having CT scan of the chest in few months as a routine work-up.  His energy level is good.  His appetite is okay and his weight is stable.  Like to keep his current medication.   Past Psychiatric History: H/O inpatient in 2005 due to psychosis and paranoia at behavioral [place].     Psychiatric Specialty Exam: Physical Exam  Review of Systems  Weight 190 lb (86.2 kg).There is no height or weight on file to calculate BMI.  General Appearance: NA  Eye Contact:  NA  Speech:  Clear and Coherent and Slow  Volume:  Normal  Mood:  Euthymic  Affect:  NA  Thought Process:  Goal Directed  Orientation:  Full (Time, Place, and Person)  Thought Content:  WDL  Suicidal Thoughts:  No  Homicidal Thoughts:  No  Memory:  Immediate;   Good Recent;   Good Remote;   Good  Judgement:  Intact  Insight:  Present  Psychomotor Activity:  NA  Concentration:   Concentration: Good and Attention Span: Good  Recall:  Good  Fund of Knowledge:  Good  Language:  Good  Akathisia:  No  Handed:  Right  AIMS (if indicated):     Assets:  Communication Skills Desire for Improvement Housing Resilience Social Support Transportation  ADL's:  Intact  Cognition:  WNL  Sleep:   ok      Assessment and Plan: Schizoaffective disorder, bipolar type.  Anxiety.  Patient is stable on his current medication.  Continue Zoloft 100 mg daily and Risperdal 1 mg at bedtime.  Recommended to call us back if is any question or any concern.  Follow-up in 6 months.  Follow Up Instructions:    I discussed the assessment and treatment plan with the patient. The patient was provided an opportunity to ask questions and all were answered. The patient agreed with the plan and demonstrated an understanding of the instructions.   The patient was advised to call back or seek an in-person evaluation if the symptoms worsen or if the condition fails to improve as anticipated.  I provided 21 minutes of non-face-to-face time during this encounter.   [name], MD

## 2021-08-10 ENCOUNTER — Ambulatory Visit
Admission: RE | Admit: 2021-08-10 | Discharge: 2021-08-10 | Disposition: A | Discharge status: Home / Self Care | Payer: Medicare Other | Source: Ambulatory Visit | Attending: Pulmonary Disease | Admitting: Pulmonary Disease

## 2021-08-10 DIAGNOSIS — J479 Bronchiectasis, uncomplicated: Secondary | ICD-10-CM | POA: Diagnosis not present

## 2021-08-10 DIAGNOSIS — I2510 Atherosclerotic heart disease of native coronary artery without angina pectoris: Secondary | ICD-10-CM | POA: Diagnosis not present

## 2021-08-10 DIAGNOSIS — J849 Interstitial pulmonary disease, unspecified: Secondary | ICD-10-CM | POA: Diagnosis not present

## 2021-08-10 DIAGNOSIS — J8410 Pulmonary fibrosis, unspecified: Secondary | ICD-10-CM | POA: Diagnosis not present

## 2021-08-10 IMAGING — CT CT CHEST HIGH RESOLUTION
2 of 8 series · 11 of 36 positions shown, 13 images · non-contrast
Comparison: [DATE] high-resolution chest CT.

CLINICAL DATA: Follow-up interstitial lung disease.  Nonsmoker.



[Series 5: chest 2.00 br36 s3 cor soft · coronal · 0.64mm/px · 3 of 157 slices shown]
[im 32/157  lung]
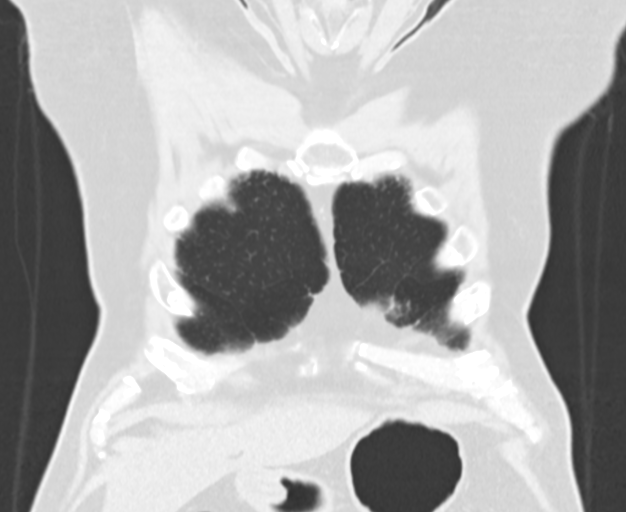
[im 63/157  lung]
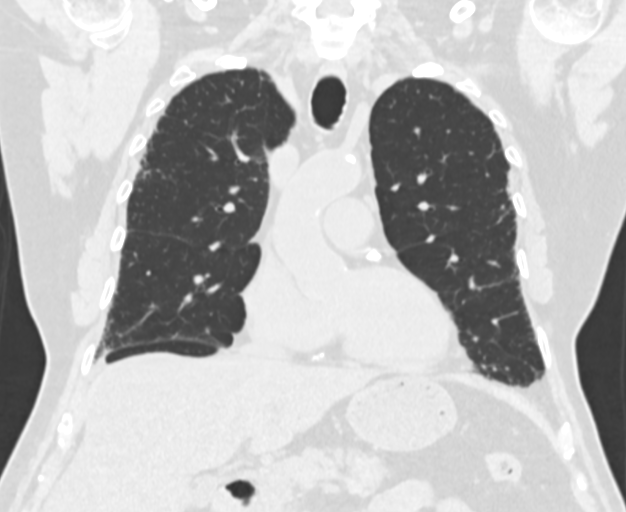
[im 94/157  lung]
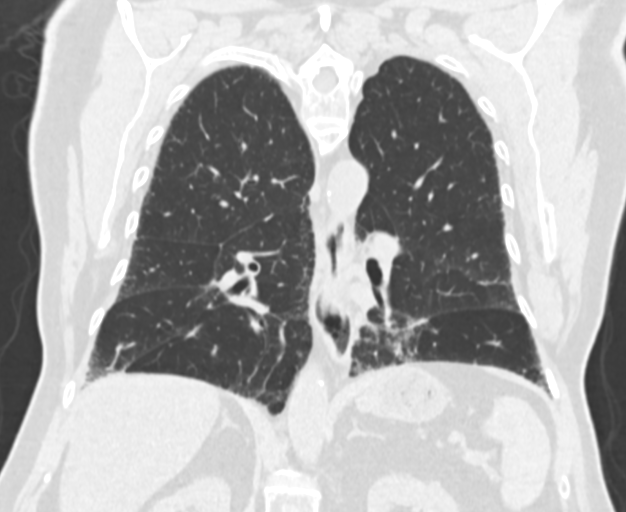

[Series 12: chest 1.00 br60 s3 high res thins 1x1 mm · axial · 0.61mm/px · z∈[+1585,+1824]mm · 8 of 287 slices shown, 10 images]
[im 24/287  mediastinal]
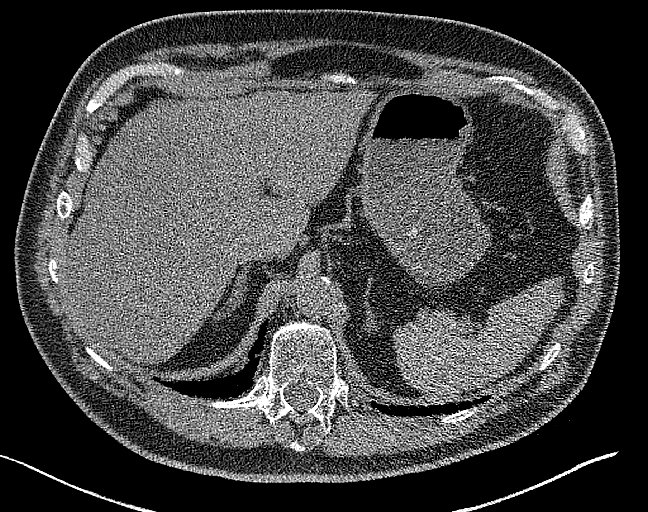
[im 24/287  lung]
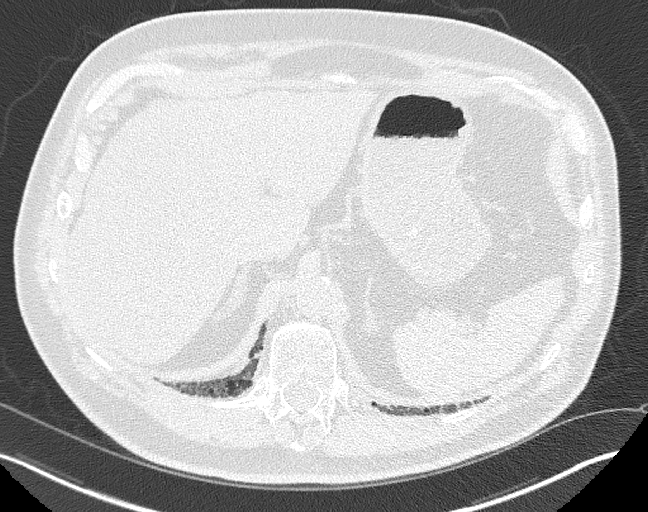
[im 72/287  lung]
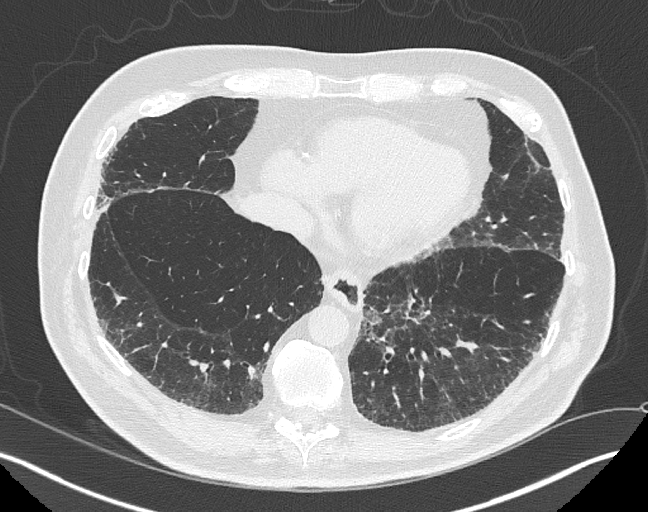
[im 96/287  lung]
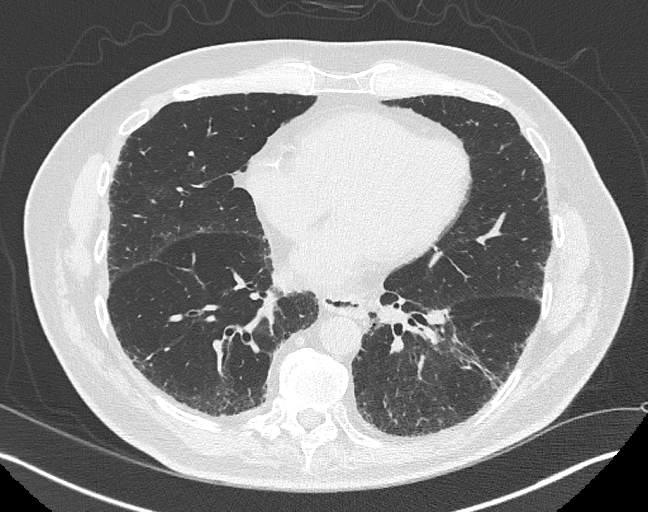
[im 120/287  lung]
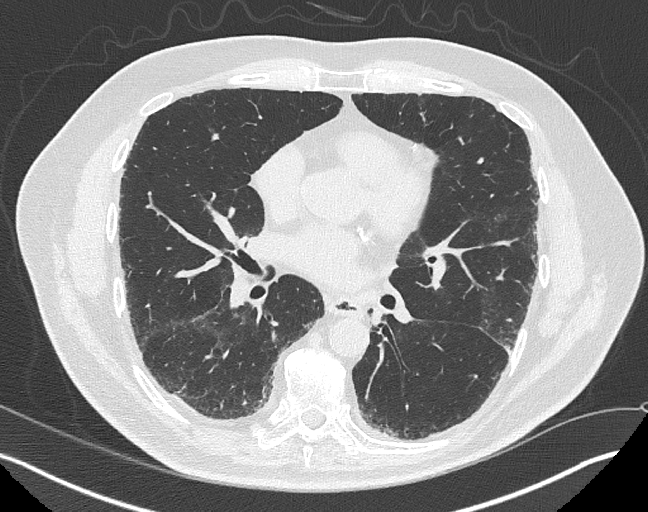
[im 167/287  mediastinal]
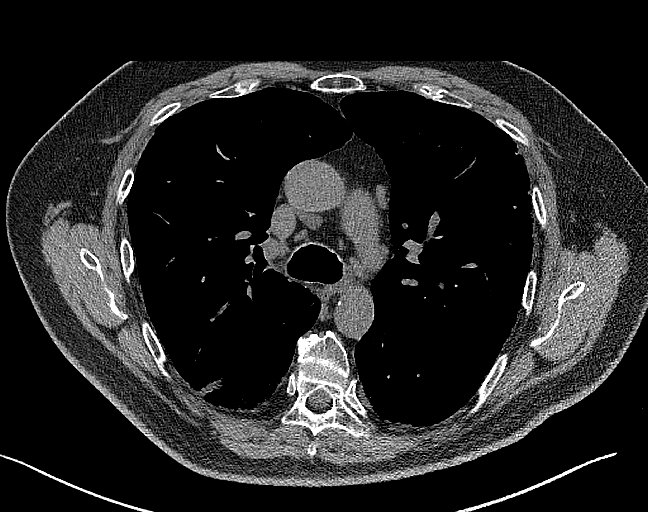
[im 167/287  lung]
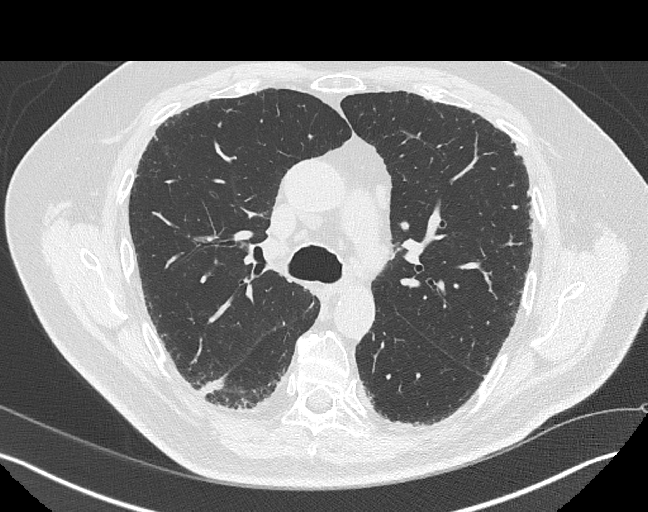
[im 191/287  lung]
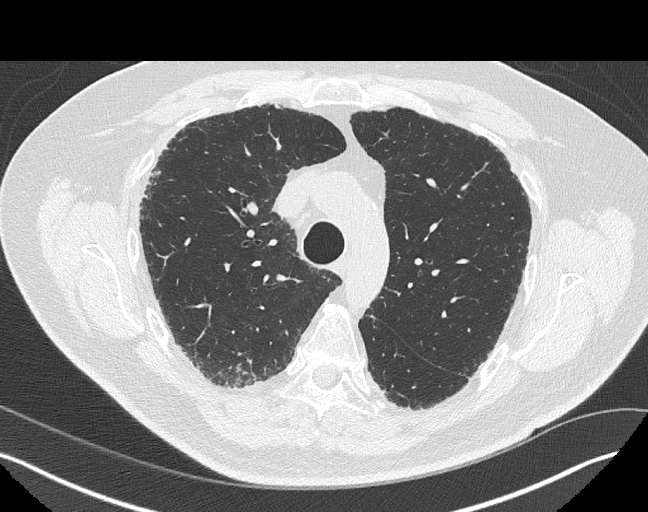
[im 215/287  lung]
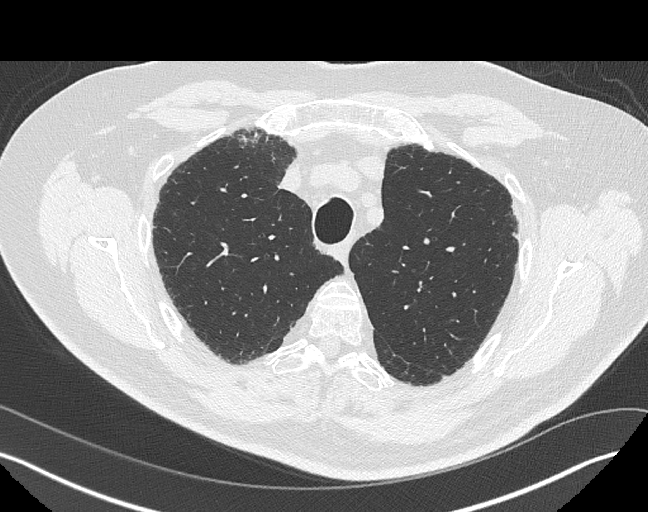
[im 263/287  lung]
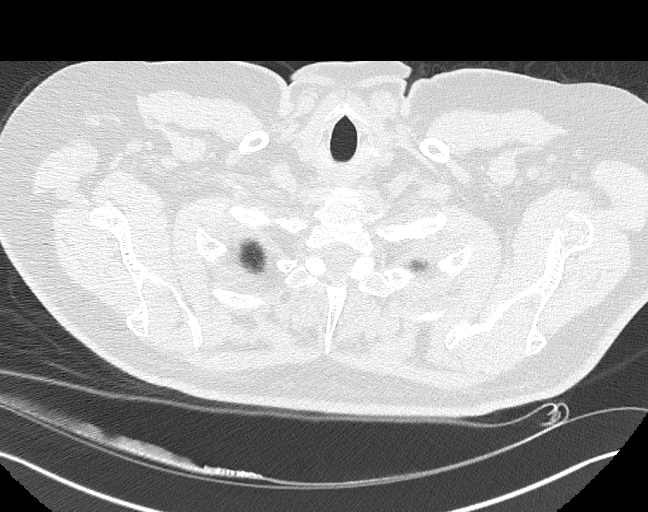

[11 of 36 positions shown; findings below may reference images not displayed]

FINDINGS: Cardiovascular: Normal heart size. No significant pericardial
effusion/thickening. Three-vessel coronary atherosclerosis.
Atherosclerotic nonaneurysmal thoracic aorta. Top-normal caliber
main pulmonary artery (3.3 cm diameter).

Mediastinum/Nodes: No discrete thyroid nodules. Unremarkable
esophagus. No pathologically enlarged axillary, mediastinal or hilar
lymph nodes, noting limited sensitivity for the detection of hilar
adenopathy on this noncontrast study.

Lungs/Pleura: No pneumothorax. No pleural effusion. No acute
consolidative airspace disease, lung masses or significant pulmonary
nodules. Bandlike subpleural consolidation in the medial basilar
left lower lobe is unchanged, compatible with nonspecific
postinfectious/postinflammatory scarring. No significant lobular air
trapping or evidence of tracheobronchomalacia on the expiration
sequence. Moderate patchy subpleural reticulation and ground-glass
opacity throughout both lungs with associated mild traction
bronchiectasis and architectural distortion. No frank honeycombing.
Mild basilar predominance to these findings. No appreciable interval
progression.

Upper abdomen: Mild diffuse hepatic steatosis.

Musculoskeletal: No aggressive appearing focal osseous lesions.
Moderate thoracic spondylosis.
IMPRESSION: 1. Spectrum of findings compatible with fibrotic interstitial lung
disease with mild basilar predominance. No frank honeycombing. No
appreciable interval progression. Findings are categorized as
probable UIP per consensus guidelines: Diagnosis of Idiopathic
Pulmonary Fibrosis: An Official ATS/ERS/JRS/ALAT Clinical Practice
Guideline. Am J Respir Crit Care Med Vol 198, JOVIS 5, [9U]-e[DATE]. Three-vessel coronary atherosclerosis.
3. Mild diffuse hepatic steatosis.
4. Aortic Atherosclerosis ([9U]-[9U]).

## 2021-08-16 ENCOUNTER — Telehealth: Payer: Self-pay | Admitting: Pulmonary Disease

## 2021-08-16 NOTE — Telephone Encounter (Signed)
Called and spoke with pt letting him know the results of HRCT and he verbalized understanding. Pt also has been scheduled for a f/u with Dr. Vaughan Browner. Nothing further needed.

## 2021-09-04 DIAGNOSIS — E782 Mixed hyperlipidemia: Secondary | ICD-10-CM | POA: Diagnosis not present

## 2021-09-04 DIAGNOSIS — I252 Old myocardial infarction: Secondary | ICD-10-CM | POA: Diagnosis not present

## 2021-09-20 ENCOUNTER — Other Ambulatory Visit: Payer: Self-pay | Admitting: Gastroenterology

## 2021-09-20 DIAGNOSIS — Z800 Family history of malignant neoplasm of digestive organs: Secondary | ICD-10-CM

## 2021-09-23 NOTE — Progress Notes (Signed)
Cardiology Office Note:    Date:  09/24/2021   ID:  Brandon Oliver, DOB 22-Jan-1946, MRN [id number]  PCP:  Brandon Cruel, MD  Cardiologist:  Brandon Grooms, MD   Referring MD: Brandon Cruel, MD   Chief Complaint  Patient presents with   Coronary Artery Disease    History of Present Illness:    Brandon Oliver is a 76 y.o. male with a hx of CAD with coronary stents after lytic therapy at [facility] in [place], history of bare metal stent circumflex 1998, hyperlipidemia, and schizoaffective disorder.    He walks 1 mile 3-5 times per week.  No symptoms associated.  He is concerned about weight gain.  He is concerned about fatty liver that is being assessed by Dr. Harrington Oliver.  He has not had angina, nitroglycerin use, tachycardia/palpitations, orthopnea, PND, or edema.  Past Medical History:  Diagnosis Date   Cataracts, both eyes    none mature   History of kidney stones    x3 episodes   Hyperlipidemia    Myocardial infarction [facility])    coronary stents x2 at that time.   Retina disorder    "floaters" ? tear- Brandon Oliver follows   Schizoaffective disorder, bipolar type [facility])    sees MD every 3- 6 months- controlled.    Past Surgical History:  Procedure Laterality Date   CARDIAC CATHETERIZATION     stents x2- Dr. Linard Millers follows   COLONOSCOPY WITH PROPOFOL N/A 02/07/2015   Procedure: COLONOSCOPY WITH PROPOFOL;  Surgeon: Brandon Fair, MD;  Location: [facility] ENDOSCOPY;  Service: Endoscopy;  Laterality: N/A;   HEMORROIDECTOMY      Current Medications: Current Meds  Medication Sig   aspirin 81 MG tablet Take 81 mg by mouth daily.   carvedilol (COREG) 3.125 MG tablet Take 3.125 mg by mouth 2 (two) times daily with a meal.    Coenzyme Q10 (COQ10) 100 MG CAPS Take 100 mg by mouth daily.   fenofibrate (TRICOR) 145 MG tablet Take 145 mg by mouth daily.   ferrous gluconate (FERGON) 240 (27 FE) MG tablet Take 1 tablet by mouth daily.   Flaxseed,  Linseed, (FLAX SEED OIL) 1000 MG CAPS Take 1 capsule by mouth daily.   Garlic 0814 MG CAPS See admin instructions.   Multiple Vitamin (MULTIVITAMIN) tablet Take 1 tablet by mouth daily.   Omega-3 Fatty Acids (FISH OIL) 1000 MG CAPS Take 1,000 mg by mouth daily.   risperiDONE (RISPERDAL) 1 MG tablet Take 1 tablet (1 mg total) by mouth at bedtime.   rosuvastatin (CRESTOR) 20 MG tablet Take 20 mg by mouth daily.   sertraline (ZOLOFT) 100 MG tablet Take 1 tablet (100 mg total) by mouth daily.     Allergies:   Penicillins and Benadryl [diphenhydramine hcl]   Social History   Socioeconomic History   Marital status: Single    Spouse name: Not on file   Number of children: Not on file   Years of education: Not on file   Highest education level: Not on file  Occupational History   Not on file  Tobacco Use   Smoking status: Never   Smokeless tobacco: Never  Vaping Use   Vaping Use: Never used  Substance and Sexual Activity   Alcohol use: No    Alcohol/week: 0.0 standard drinks   Drug use: No   Sexual activity: Not Currently  Other Topics Concern   Not on file  Social History Narrative  Not on file   Social Determinants of Health   Financial Resource Strain: Not on file  Food Insecurity: Not on file  Transportation Needs: Not on file  Physical Activity: Not on file  Stress: Not on file  Social Connections: Not on file     Family History: The patient's family history includes Cancer in his father, maternal grandmother, mother, paternal aunt, paternal grandfather, paternal uncle, and paternal uncle; Diabetes in his maternal aunt; Parkinson's disease in his father; Rheum arthritis in his father.  ROS:   Please see the history of present illness.    Appetite is stable.  No medication side effects.  Taking over-the-counter omega-3 fish oil.  We discussed that there is no data that shows it provides any benefit.  He is on fenofibrate for his hypertriglyceridemia.  All other systems  reviewed and are negative.  EKGs/Labs/Other Studies Reviewed:    The following studies were reviewed today: No new or recent imaging data.  I do not believe any is needed currently as the patient is asymptomatic and gets regular exercise.  EKG:  EKG normal sinus rhythm, old inferior infarct, otherwise normal.  Compared to January 2022, significant changes noted.  Recent Labs: No results found for requested labs within last 8760 hours.  Recent Lipid Panel No results found for: CHOL, TRIG, HDL, CHOLHDL, VLDL, LDLCALC, LDLDIRECT  Physical Exam:    VS:  BP 130/80    Pulse 78    Ht 5\' 9"  (1.753 m)    Wt 195 lb 3.2 oz (88.5 kg)    SpO2 97%    BMI 28.83 kg/m     Wt Readings from Last 3 Encounters:  09/24/21 195 lb 3.2 oz (88.5 kg)  10/11/20 187 lb 3.2 oz (84.9 kg)  09/20/20 189 lb 12.8 oz (86.1 kg)     GEN: Overweight. No acute distress HEENT: Normal NECK: No JVD. LYMPHATICS: No lymphadenopathy CARDIAC: No murmur. RRR no gallop, or edema. VASCULAR:  Normal Pulses. No bruits. RESPIRATORY:  Clear to auscultation without rales, wheezing or rhonchi  ABDOMEN: Soft, non-tender, non-distended, No pulsatile mass, MUSCULOSKELETAL: No deformity  SKIN: Warm and dry NEUROLOGIC:  Alert and oriented x 3 PSYCHIATRIC:  Normal affect   ASSESSMENT:    1. Atherosclerosis of coronary artery of native heart with stable angina pectoris, unspecified vessel or lesion type ([place])   2. Mixed hyperlipidemia   3. Dyspnea on exertion   4. Pulmonary fibrosis ([place])    PLAN:    In order of problems listed above:  Secondary prevention reviewed.  I have encouraged him to increase the duration of his walks to achieve at least 150 minutes of moderate activity every week.  We also discussed the lack of evidence that fenofibrate and omega-3 fatty acids provide cardiovascular benefit.  They do lower triglyceride and if there is some noncardiovascular benefit such as pancreatitis, okay to continue those therapies.   From cardiac standpoint icosapent ethyl Vascepa is the only therapy with data for CV event reduction. Continue statin.  Consider starting Vascepa and discontinuing over-the-counter omega-3 fatty acid and Tricor. No complaints of dyspnea on exertion.    Overall education and awareness concerning secondary risk prevention was discussed in detail: LDL less than 70, hemoglobin A1c less than 7, blood pressure target less than 130/80 mmHg, >150 minutes of moderate aerobic activity per week, avoidance of smoking, weight control (via diet and exercise), and continued surveillance/management of/for obstructive sleep apnea.   Medication Adjustments/Labs and Tests Ordered: Current medicines are reviewed  at length with the patient today.  Concerns regarding medicines are outlined above.  Orders Placed This Encounter  Procedures   EKG 12-Lead   No orders of the defined types were placed in this encounter.   Patient Instructions  Medication Instructions:  Your physician recommends that you continue on your current medications as directed. Please refer to the Current Medication list given to you today.  *If you need a refill on your cardiac medications before your next appointment, please call your pharmacy*   Lab Work: None If you have labs (blood work) drawn today and your tests are completely normal, you will receive your results only by: [place] (if you have MyChart) OR A paper copy in the mail If you have any lab test that is abnormal or we need to change your treatment, we will call you to review the results.   Testing/Procedures: None   Follow-Up: At [facility], you and your health needs are our priority.  As part of our continuing mission to provide you with exceptional heart care, we have created designated Provider Care Teams.  These Care Teams include your primary Cardiologist (physician) and Advanced Practice Providers (APPs -  Physician Assistants and Nurse  Practitioners) who all work together to provide you with the care you need, when you need it.  We recommend signing up for the patient portal called "MyChart".  Sign up information is provided on this After Visit Summary.  MyChart is used to connect with patients for Virtual Visits (Telemedicine).  Patients are able to view lab/test results, encounter notes, upcoming appointments, etc.  Non-urgent messages can be sent to your provider as well.   To learn more about what you can do with MyChart, go to [email or web address].    Your next appointment:   1 year(s)  The format for your next appointment:   In Person  Provider:   [name], MD     Other Instructions     Signed, Brandon Grooms, MD  09/24/2021 3:18 PM    [place]

## 2021-09-24 ENCOUNTER — Ambulatory Visit: Payer: Medicare Other | Admitting: Interventional Cardiology

## 2021-09-24 ENCOUNTER — Other Ambulatory Visit: Payer: Self-pay

## 2021-09-24 ENCOUNTER — Encounter: Payer: Self-pay | Admitting: Interventional Cardiology

## 2021-09-24 VITALS — BP 130/80 | HR 78 | Ht 69.0 in | Wt 195.2 lb

## 2021-09-24 DIAGNOSIS — E782 Mixed hyperlipidemia: Secondary | ICD-10-CM | POA: Diagnosis not present

## 2021-09-24 DIAGNOSIS — I25118 Atherosclerotic heart disease of native coronary artery with other forms of angina pectoris: Secondary | ICD-10-CM | POA: Diagnosis not present

## 2021-09-24 DIAGNOSIS — J8410 Pulmonary fibrosis, unspecified: Secondary | ICD-10-CM | POA: Diagnosis not present

## 2021-09-24 DIAGNOSIS — R0609 Other forms of dyspnea: Secondary | ICD-10-CM | POA: Diagnosis not present

## 2021-09-24 NOTE — Patient Instructions (Signed)

## 2021-10-09 ENCOUNTER — Ambulatory Visit
Admission: RE | Admit: 2021-10-09 | Discharge: 2021-10-09 | Disposition: A | Discharge status: Home / Self Care | Payer: Medicare Other | Source: Ambulatory Visit | Attending: Gastroenterology | Admitting: Gastroenterology

## 2021-10-09 DIAGNOSIS — K760 Fatty (change of) liver, not elsewhere classified: Secondary | ICD-10-CM | POA: Diagnosis not present

## 2021-10-09 DIAGNOSIS — R188 Other ascites: Secondary | ICD-10-CM | POA: Diagnosis not present

## 2021-10-09 DIAGNOSIS — R160 Hepatomegaly, not elsewhere classified: Secondary | ICD-10-CM | POA: Diagnosis not present

## 2021-10-09 DIAGNOSIS — Z800 Family history of malignant neoplasm of digestive organs: Secondary | ICD-10-CM

## 2021-10-09 DIAGNOSIS — N281 Cyst of kidney, acquired: Secondary | ICD-10-CM | POA: Diagnosis not present

## 2021-10-09 IMAGING — MR MR ABDOMEN WO/W CM
15 of 21 series · 30 of 48 positions shown · IV contrast (multihance)
Comparison: Abdominal ultrasound [DATE], MRI abdomen [DATE]

CLINICAL DATA: Abdominal wall lesion

EXAM:
MRI ABDOMEN WITHOUT AND WITH CONTRAST
TECHNIQUE: Multiplanar multisequence MR imaging of the abdomen was performed
both before and after the administration of intravenous contrast.
CONTRAST:  20mL MULTIHANCE GADOBENATE DIMEGLUMINE 529 MG/ML IV SOLN

[Series 3: T2 · coronal · 5.0mm · 1.64mm/px · 2 of 39 slices shown (1 of 6)]
[im 1/39]
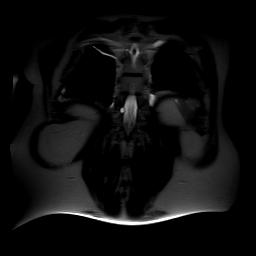
[im 39/39]
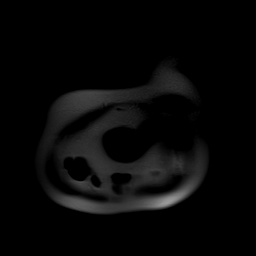

[Series 4: axial in out · axial · 6.0mm · 0.82mm/px · z∈[-108,+137]mm · 3 of 72 slices shown]
[im 1/72]
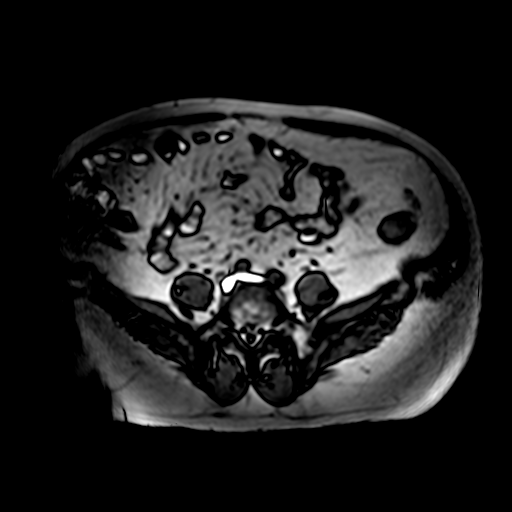
[im 36/72]
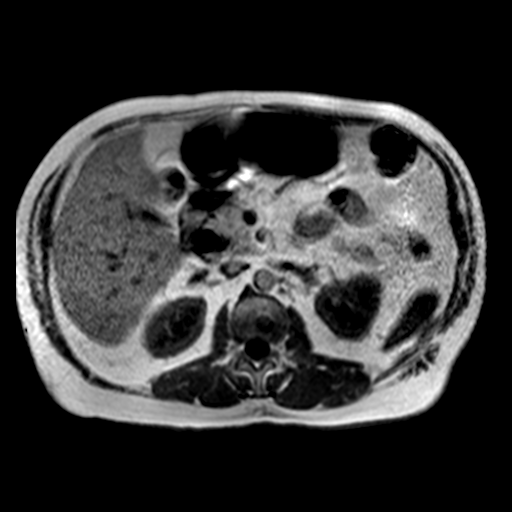
[im 72/72]
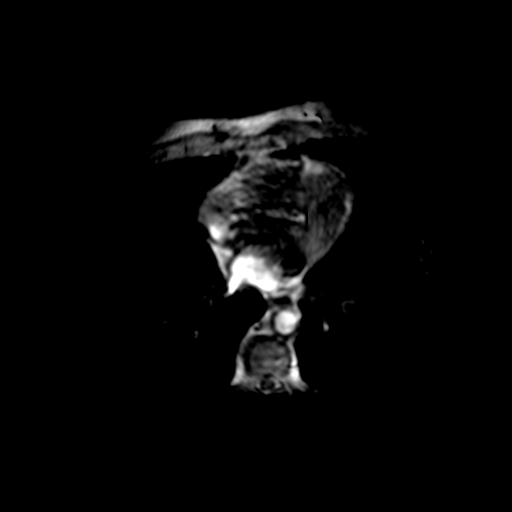

[Series 5: T2 · axial · 6.0mm · 0.82mm/px · 1 of 40 slices shown (2 of 6)]
[im 1/40]
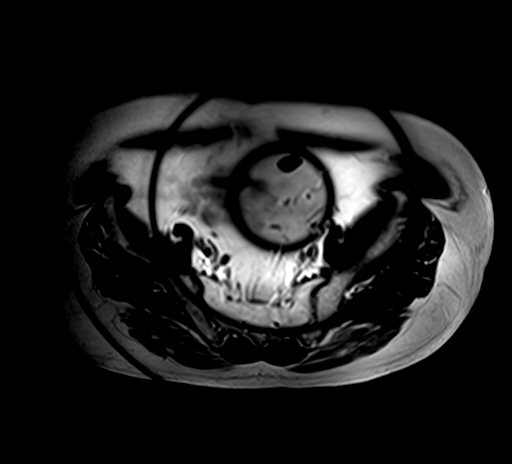

[Series 6: ep2d_diff_b50_500_800_p2_trig · axial · 6.0mm · 2.19mm/px · z∈[-115,+166]mm · 4 of 119 slices shown]
[im 1/119]
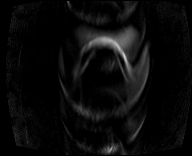
[im 40/119]
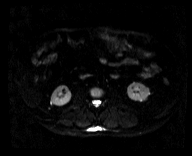
[im 79/119]
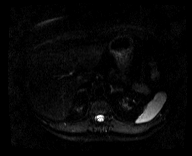
[im 119/119]
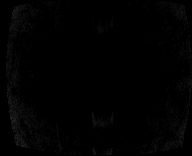

[Series 7: ep2d_diff_b50_500_800_p2_trig_adc · axial · 6.0mm · 2.19mm/px · 1 of 40 slices shown]
[im 1/40]
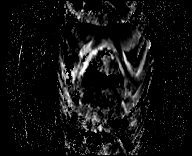

[Series 10: T2 · axial · 6.0mm · 0.82mm/px · 1 of 40 slices shown (3 of 6)]
[im 1/40]
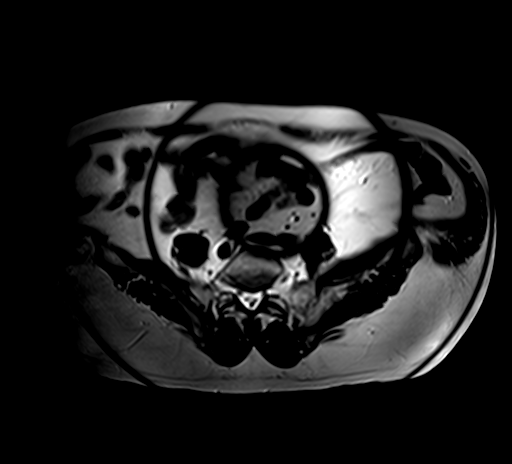

[Series 13: T2 · coronal · 3.0mm · 1.48mm/px · 1 of 39 slices shown (4 of 6)]
[im 1/39]
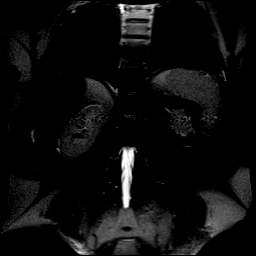

[Series 14: T2 · axial · 5.0mm · 1.64mm/px · 1 of 42 slices shown (5 of 6)]
[im 1/42]
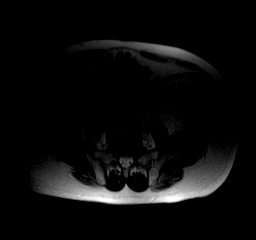

[Series 15: T2 · coronal · 3.0mm · 1.48mm/px · 1 of 39 slices shown (6 of 6)]
[im 1/39]
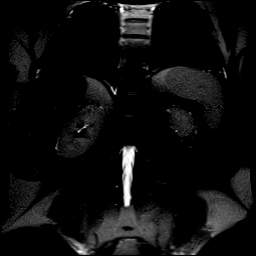

[Series 16: axial tru fisp · axial · 5.5mm · 1.64mm/px · 1 of 37 slices shown]
[im 1/37]
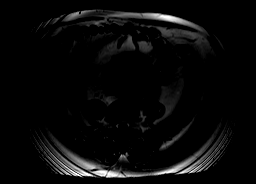

[Series 17: T1 dynamic · axial · non-contrast · 3.0mm · 0.78mm/px · z∈[-113,+124]mm · 3 of 80 slices shown]
[im 1/80]
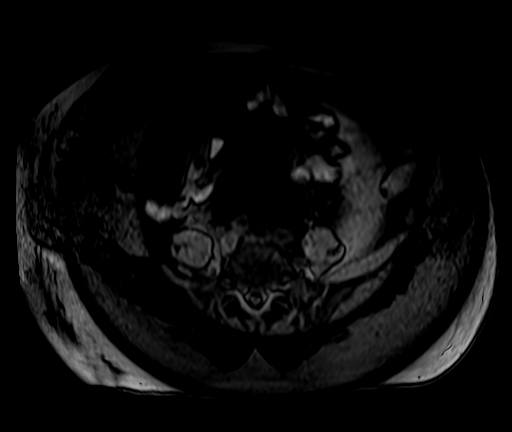
[im 40/80]
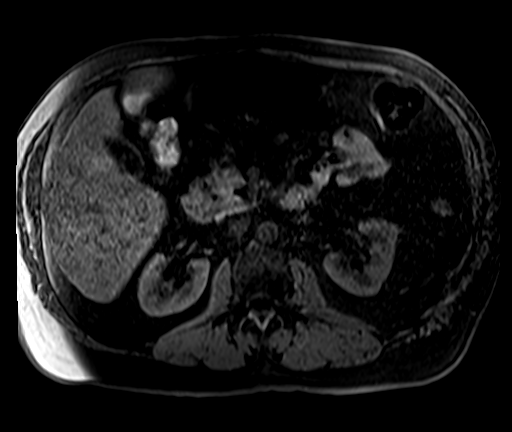
[im 80/80]
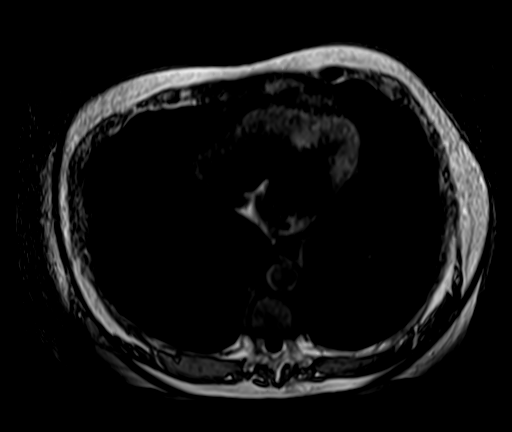

[Series 18: post 25 sec · axial · 3.0mm · 0.78mm/px · z∈[-113,+124]mm · 3 of 80 slices shown]
[im 1/80]
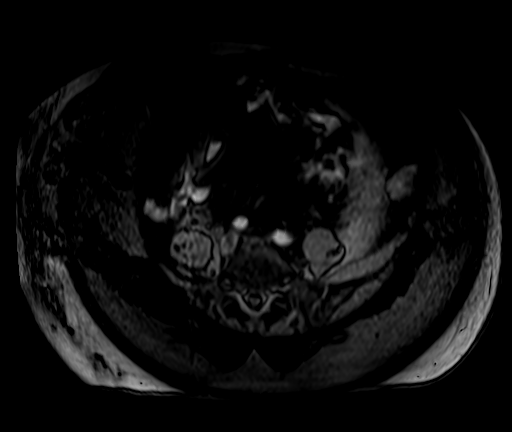
[im 40/80]
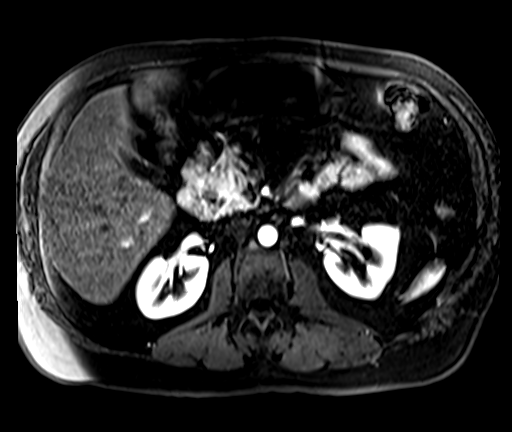
[im 80/80]
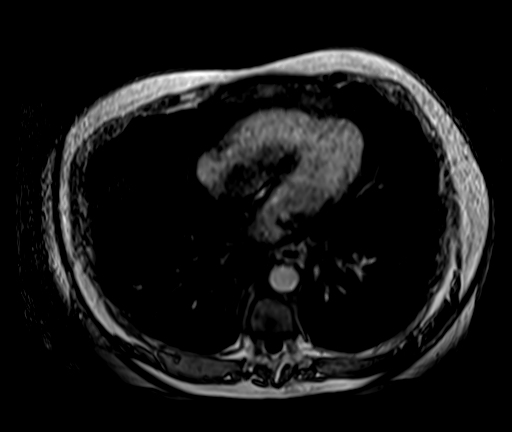

[Series 19: post 25 sec_sub · axial · 3.0mm · 0.78mm/px · z∈[-113,+124]mm · 3 of 80 slices shown]
[im 1/80]
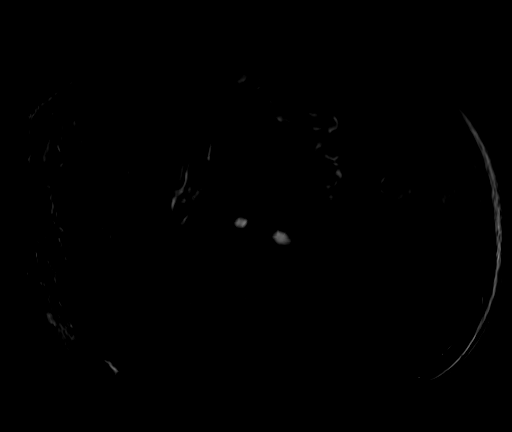
[im 40/80]
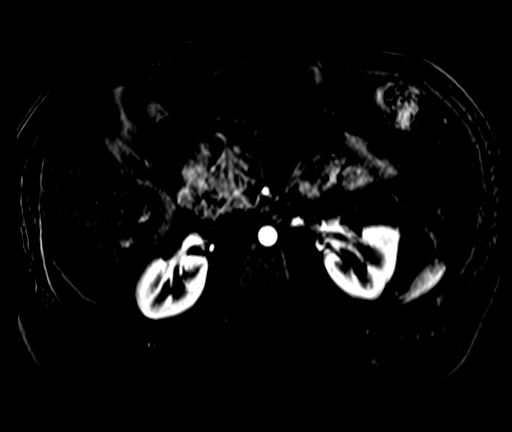
[im 80/80]
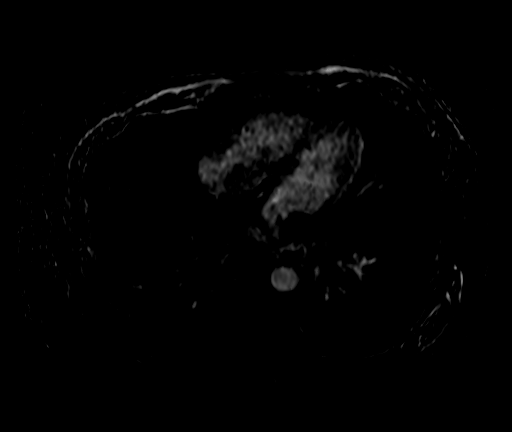

[Series 20: post 45 sec · axial · 3.0mm · 0.78mm/px · z∈[-113,+124]mm · 3 of 80 slices shown]
[im 1/80]
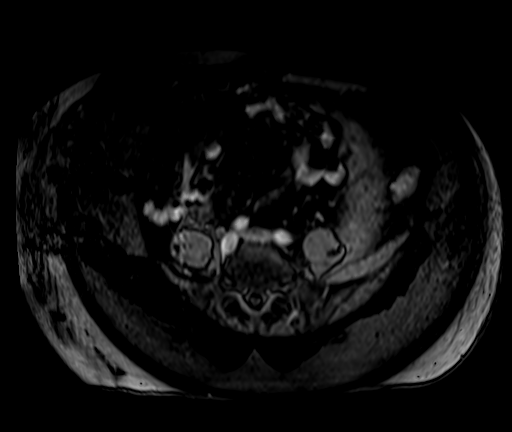
[im 40/80]
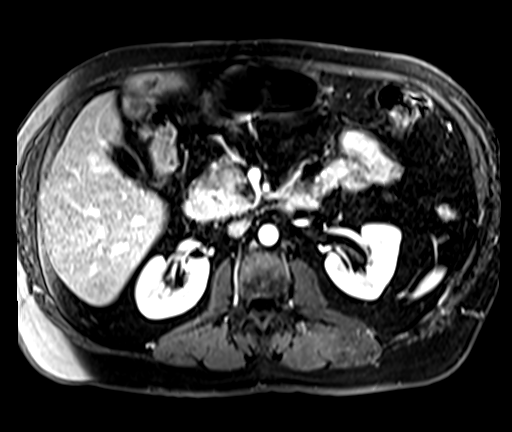
[im 80/80]
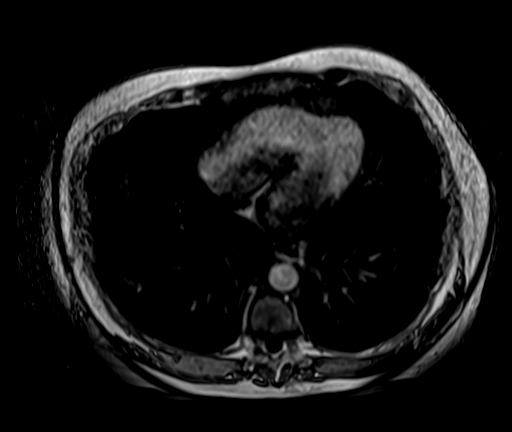

[Series 21: post 45 sec_sub · axial · 3.0mm · 0.78mm/px · z∈[-113,+4]mm · 2 of 80 slices shown]
[im 1/80]
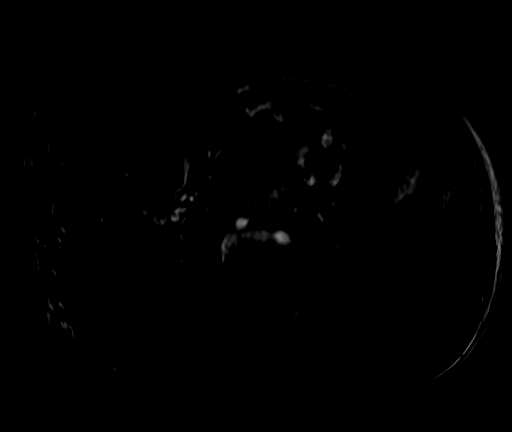
[im 40/80]
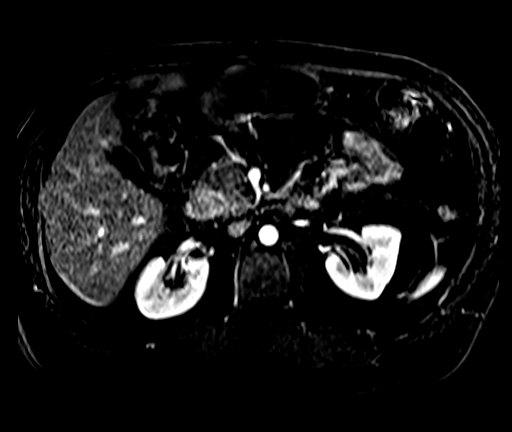

[30 of 48 positions shown; findings below may reference images not displayed]

FINDINGS: Lower chest: No acute findings.

Hepatobiliary: Liver is mildly enlarged with evidence of diffuse
steatosis. No suspicious hepatic mass identified. Gallbladder
appears normal. No biliary ductal dilatation identified.

Pancreas: No mass, inflammatory changes, or other parenchymal
abnormality identified.

Spleen:  Within normal limits in size and appearance.

Adrenals/Urinary Tract: Adrenal glands appear normal. Tiny cyst in
the upper pole left kidney. Kidneys are otherwise unremarkable.

Stomach/Bowel: No evidence of bowel obstruction.

Vascular/Lymphatic: No pathologically enlarged lymph nodes
identified. No abdominal aortic aneurysm demonstrated.

Other:  Ascites.

Musculoskeletal: No suspicious bone lesions identified. No
suspicious abdominal wall mass identified.
IMPRESSION: 1. No suspicious abdominal wall mass identified.
2. Hepatic steatosis and mild hepatomegaly.

## 2021-10-09 MED ORDER — GADOBENATE DIMEGLUMINE 529 MG/ML IV SOLN
20.0000 mL | Freq: Once | INTRAVENOUS | Status: AC | PRN
  Administered 2021-10-09: 20 mL via INTRAVENOUS

## 2021-10-21 DIAGNOSIS — R059 Cough, unspecified: Secondary | ICD-10-CM | POA: Diagnosis not present

## 2021-10-21 DIAGNOSIS — B349 Viral infection, unspecified: Secondary | ICD-10-CM | POA: Diagnosis not present

## 2021-10-21 DIAGNOSIS — Z03818 Encounter for observation for suspected exposure to other biological agents ruled out: Secondary | ICD-10-CM | POA: Diagnosis not present

## 2021-10-22 DIAGNOSIS — E782 Mixed hyperlipidemia: Secondary | ICD-10-CM | POA: Diagnosis not present

## 2021-10-22 DIAGNOSIS — Z0000 Encounter for general adult medical examination without abnormal findings: Secondary | ICD-10-CM | POA: Diagnosis not present

## 2021-10-22 DIAGNOSIS — I252 Old myocardial infarction: Secondary | ICD-10-CM | POA: Diagnosis not present

## 2021-10-22 DIAGNOSIS — Z79899 Other long term (current) drug therapy: Secondary | ICD-10-CM | POA: Diagnosis not present

## 2021-10-26 ENCOUNTER — Encounter (INDEPENDENT_AMBULATORY_CARE_PROVIDER_SITE_OTHER): Payer: Medicare Other | Admitting: Ophthalmology

## 2021-11-06 DIAGNOSIS — J208 Acute bronchitis due to other specified organisms: Secondary | ICD-10-CM | POA: Diagnosis not present

## 2021-11-13 ENCOUNTER — Encounter (INDEPENDENT_AMBULATORY_CARE_PROVIDER_SITE_OTHER): Payer: Medicare Other | Admitting: Ophthalmology

## 2021-11-14 ENCOUNTER — Encounter: Payer: Self-pay | Admitting: Pulmonary Disease

## 2021-11-14 ENCOUNTER — Ambulatory Visit (INDEPENDENT_AMBULATORY_CARE_PROVIDER_SITE_OTHER): Payer: Medicare Other

## 2021-11-14 ENCOUNTER — Ambulatory Visit: Payer: Medicare Other | Admitting: Pulmonary Disease

## 2021-11-14 VITALS — BP 122/58 | HR 77 | Temp 98.4°F | Ht 69.0 in | Wt 190.0 lb

## 2021-11-14 DIAGNOSIS — J849 Interstitial pulmonary disease, unspecified: Secondary | ICD-10-CM

## 2021-11-14 DIAGNOSIS — R059 Cough, unspecified: Secondary | ICD-10-CM | POA: Diagnosis not present

## 2021-11-14 IMAGING — DX DG CHEST 2V
2 series · 2 of 2 positions shown · non-contrast
Comparison: Chest radiograph [DATE].

CLINICAL DATA: Cough.  Interstitial lung disease.

EXAM:
CHEST - 2 VIEW

[chest pa]
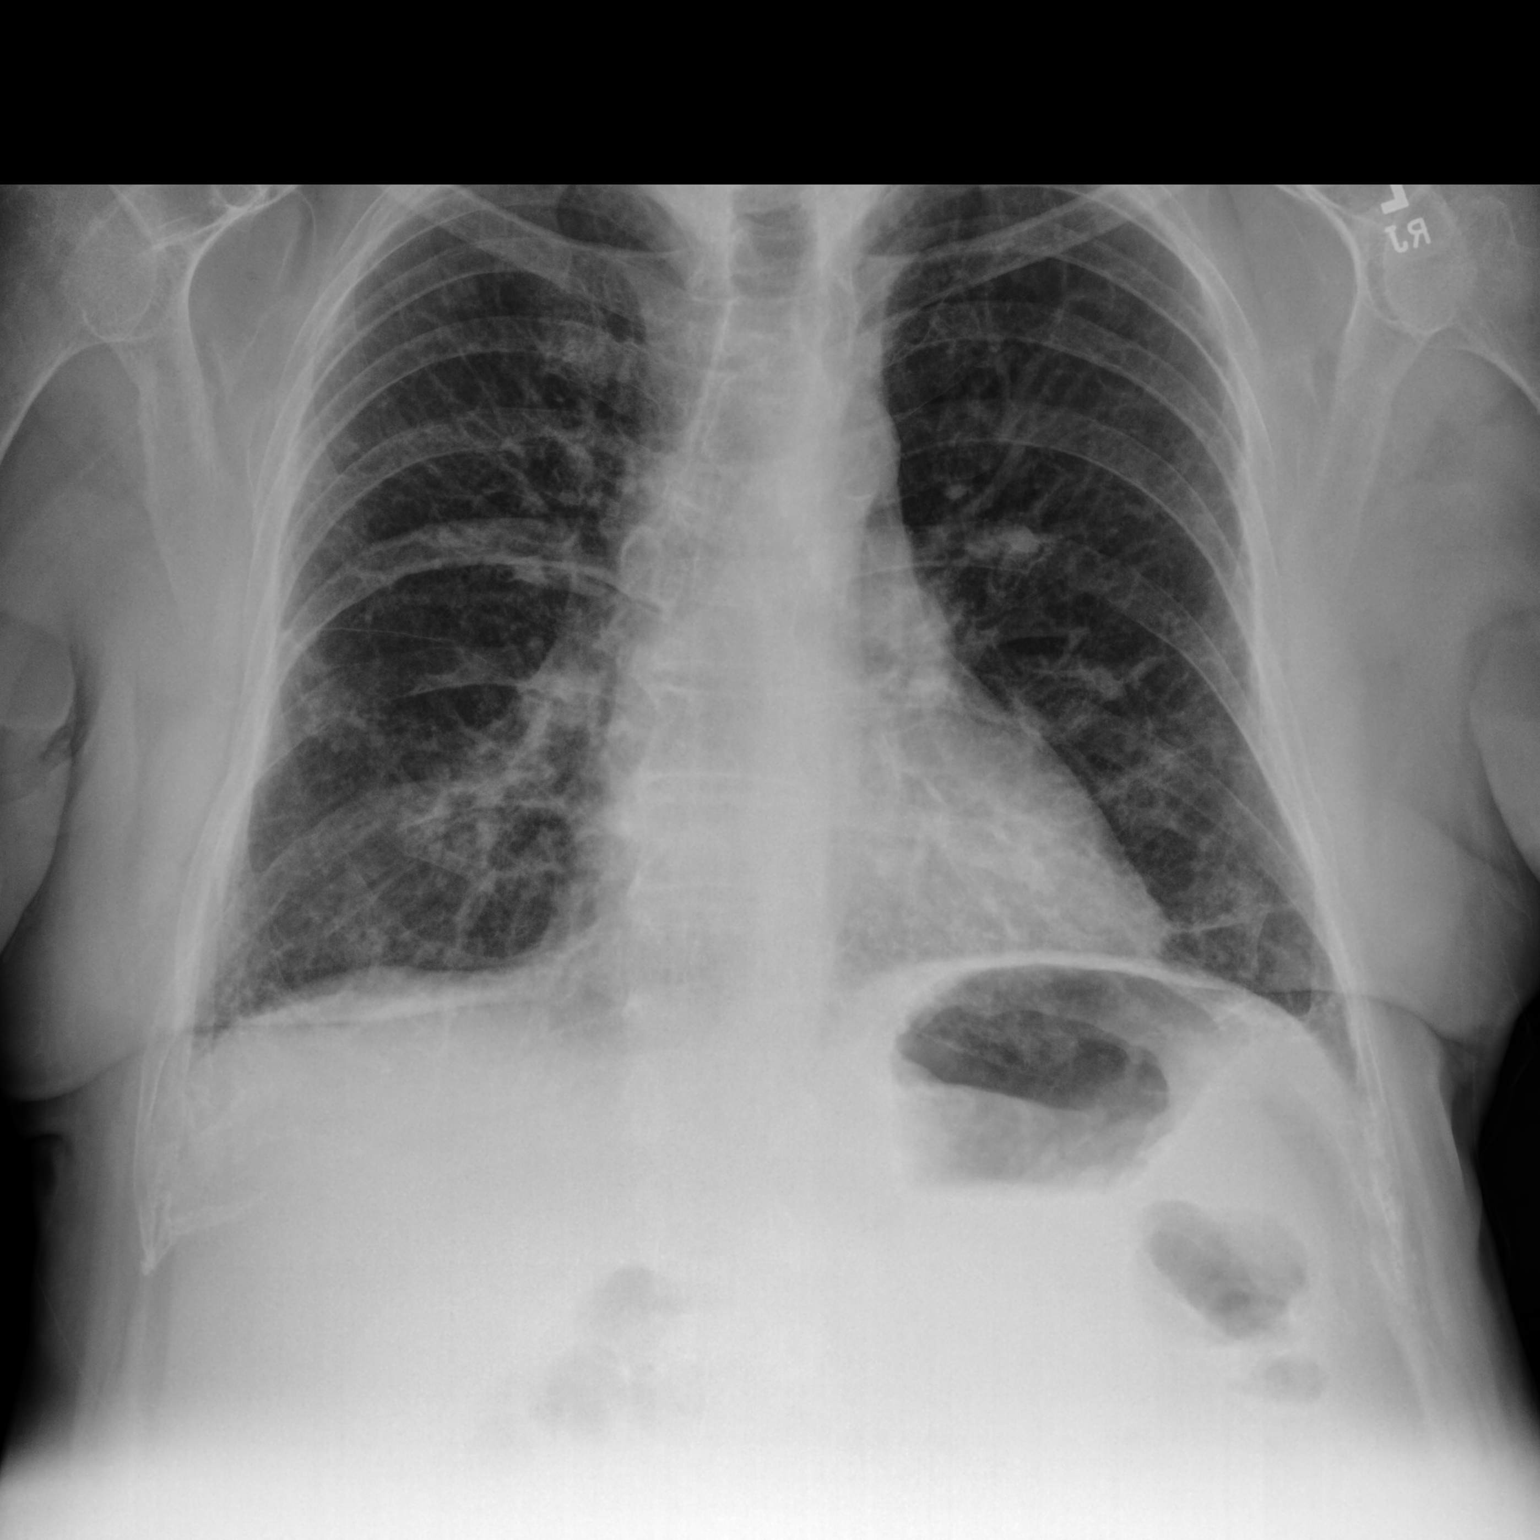

[chest lat]
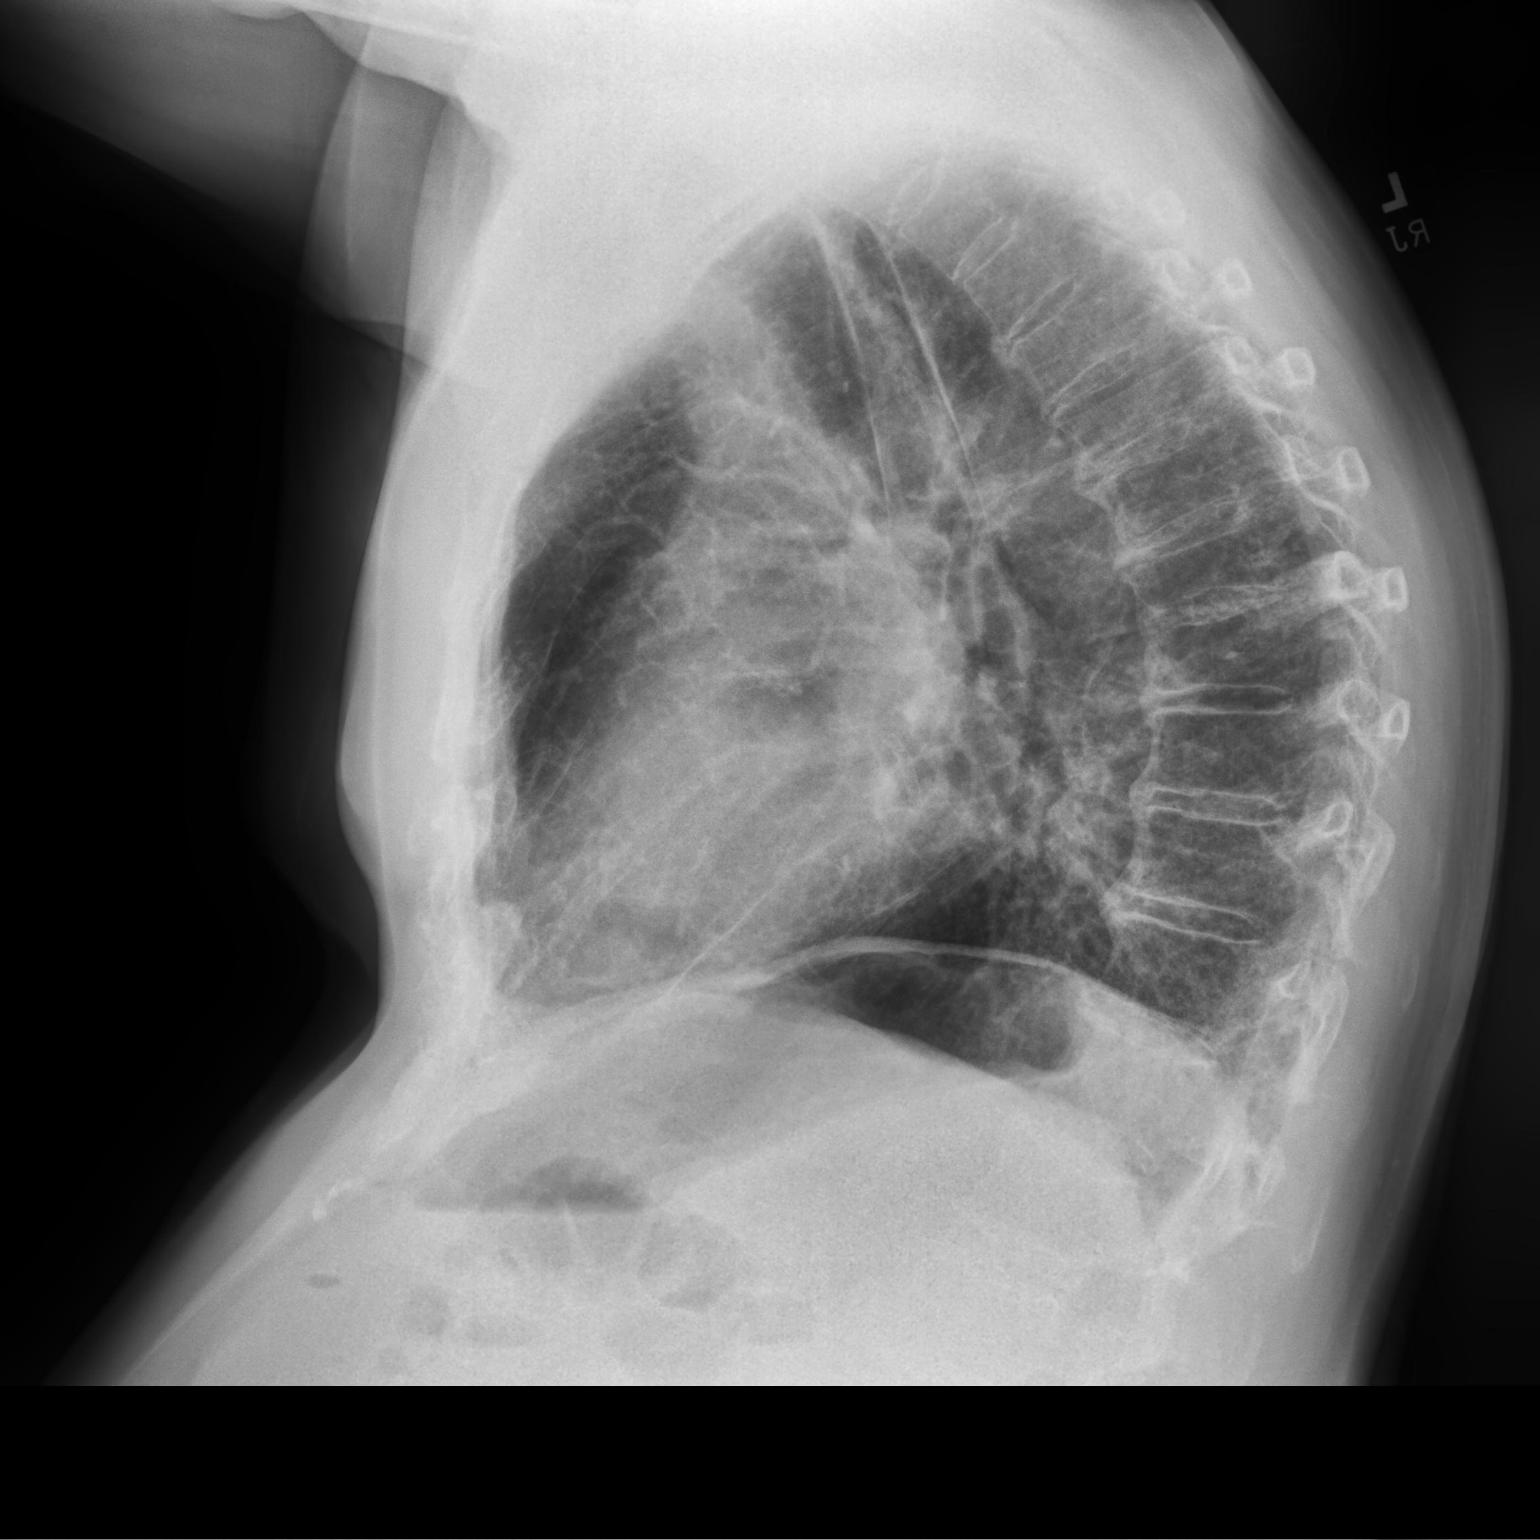

[2 of 2 positions shown; findings below may reference images not displayed]

FINDINGS: Stable cardiac and mediastinal contours. Similar-appearing coarse
bilateral interstitial pulmonary opacities. No definite acute
superimposed process. No pleural effusion or pneumothorax. Thoracic
spine degenerative changes.
IMPRESSION: Findings compatible with fibrotic interstitial lung disease. No
definite superimposed process identified.

## 2021-11-14 NOTE — Addendum Note (Signed)
Addended by: Elton Sin on: 11/14/2021 02:14 PM ? ? Modules accepted: Orders ? ?

## 2021-11-14 NOTE — Progress Notes (Signed)
? ?      Brandon Oliver    [id number]    06/19/1946 ? ?Primary Care Physician:Ross, Dwyane Luo, MD ? ?Referring Physician: [name], MD ?[address] ?[place],  [place] [id number] ? ?Chief complaint: Follow-up for interstitial lung disease ? ?HPI: ?76 year old with history of hypertension, MI, schizoaffective disorder ?Sent here for an abnormal chest x-ray that he got from his primary care.  He was told that he had "fluid, lung nodules and damage to the lungs" on the chest x-ray needed a pulmonary evaluation ? ?Complains of mild dyspnea on exertion.  He is able to exercise and walks up to a mile without any issue every day.  Denies any cough, sputum production, fevers, chills.  He has history of coronary artery disease, heart failure and follows with Dr. Tamala Julian, cardiology ? ?He has been referred to rheumatology for elevated ANA in 2022.  Evaluated by Dr. Benjamine Mola who did not feel he has any autoimmune disease ? 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 ?Exposures: No known exposures.  No mold, hot tub, Jacuzzi ?Smoking history: Never smoker ?Travel history: Originally from [place].  However travel to [place] in October 2021 ?Relevant family history: No significant family history of lung disease. ? ?Interim history: ?States that breathing is stable with mild dyspnea on exertion with no change since last time. ? ?He had episode of bronchitis with symptoms starting 1 month ago.  Evaluated in urgent care and given Z-Pak and Tessalon with some improvement. ?Still continues to have occasional cough with white mucus. ? ?Outpatient Encounter Medications as of 11/14/2021  ?Medication Sig  ? aspirin 81 MG tablet Take 81 mg by mouth daily.  ? carvedilol (COREG) 3.125 MG tablet Take 3.125 mg by mouth 2 (two) times daily with a meal.   ? Coenzyme Q10 (COQ10) 100 MG CAPS Take 100 mg by mouth daily.  ? fenofibrate (TRICOR) 145 MG tablet Take 145 mg by mouth daily.  ? ferrous  gluconate (FERGON) 240 (27 FE) MG tablet Take 1 tablet by mouth daily.  ? Flaxseed, Linseed, (FLAX SEED OIL) 1000 MG CAPS Take 1 capsule by mouth daily.  ? Garlic 6213 MG CAPS See admin instructions.  ? Multiple Vitamin (MULTIVITAMIN) tablet Take 1 tablet by mouth daily.  ? Omega-3 Fatty Acids (FISH OIL) 1000 MG CAPS Take 1,000 mg by mouth daily.  ? risperiDONE (RISPERDAL) 1 MG tablet Take 1 tablet (1 mg total) by mouth at bedtime.  ? rosuvastatin (CRESTOR) 20 MG tablet Take 20 mg by mouth daily.  ? sertraline (ZOLOFT) 100 MG tablet Take 1 tablet (100 mg total) by mouth daily.  ? ?No facility-administered encounter medications on file as of 11/14/2021.  ? ? ?Physical Exam: ?Blood pressure (!) 122/58, pulse 77, temperature 98.4 ?F (36.9 ?C), temperature source Oral, height '5\' 9"'$  (1.753 m), weight 190 lb (86.2 kg), SpO2 95 %. ?Gen:      No acute distress ?HEENT:  EOMI, sclera anicteric ?Neck:     No masses; no thyromegaly ?Lungs:    Clear to auscultation bilaterally; normal respiratory effort ?CV:         Regular rate and rhythm; no murmurs ?Abd:      + bowel sounds; soft, non-tender; no palpable masses, no distension ?Ext:    No edema; adequate peripheral perfusion ?Skin:      Warm and dry; no rash ?Neuro: alert and oriented x 3 ?Psych: normal mood and affect  ? ?  Data Reviewed: ?Imaging: ?Chest x-ray 02/22/2016-mild interstitial changes ? ?Chest x-ray from primary care 06/06/2020- pulmonary vascular congestion with bibasal airspace disease/atelectasis.   ? ?High-resolution CT 07/25/2020-peripheral groundglass with septal thickening, mild bronchiectasis and probable UIP pattern.  Aortic and three-vessel coronary atherosclerosis ? ?High-resolution CT 08/11/2021-stable pattern of pulmonary fibrosis and probable UIP pattern ? ?PFTs: ?09/05/2020 ?FVC 2.49 [4%], FEV1 2.69 [90%], F/F 74, TLC 5.4 [79%], DLCO 22.25 [90%] ?Normal test ? ?Labs: ?CTD serologies 08/02/2020-ANA 1:1280, nuclear, SRP weakly positive ? ?Assessment:   ?Interstitial lung disease ?I have reviewed CT which shows probable UIP pattern pulmonary fibrosis.  I note that he had a chest x-ray in 2017 which showed mild interstitial changes.  Though he has high ANA and positive SRP rheumatology feels that there is no evidence of autoimmune process ? ?Symptomatically he feels well with no issues and PFTs are normal ?I discussed treatment options with him in detail today.  We could consider initiation of antifibrotics but he has Karlene Lineman and is concerned about side effects on his liver.  He would like to hold off any work-up such as biopsy or treatment for now and monitor next ? ?Repeat CT from January noted with stable scarring.  We will schedule PFTs in 6 months and follow-up in clinic ? ?Acute bronchitis ?He is recovering after getting azithromycin and Tessalon but still has occasional cough with mucus. ?Get chest x-ray today to reevaluate ? ? ?Plan/Recommendations: ?Chest x-ray ?Follow-up PFTs in 6 months. ? ?Marshell Garfinkel MD ?[facility] ?11/14/2021, 1:57 PM ? ?CC: [name], MD ? ? ?

## 2021-11-14 NOTE — Patient Instructions (Signed)
We will get a chest x-ray today to reevaluate her lungs. ?Schedule spirometry and diffusion capacity in 6 months and follow-up in clinic after that ?

## 2021-11-15 ENCOUNTER — Telehealth: Payer: Self-pay | Admitting: Pulmonary Disease

## 2021-11-16 NOTE — Telephone Encounter (Signed)
Called patient but he did not answer. Left message for him to call back.  

## 2021-11-16 NOTE — Telephone Encounter (Signed)
Chest x-ray stable with chronic scarring.  There is no evidence of pneumonia.  Continue current management. ?

## 2021-11-16 NOTE — Telephone Encounter (Signed)
Called and spoke with Patient.  Dr. Matilde Bash results and recommendations given. Understanding stated. Nothing further at this time.  ?

## 2021-11-16 NOTE — Telephone Encounter (Signed)
Dr. Vaughan Browner, please advise on results of pt's cxr which was done after pt's OV 4/19 as he is calling requesting the results. ?

## 2021-11-20 DIAGNOSIS — L218 Other seborrheic dermatitis: Secondary | ICD-10-CM | POA: Diagnosis not present

## 2021-11-20 DIAGNOSIS — D225 Melanocytic nevi of trunk: Secondary | ICD-10-CM | POA: Diagnosis not present

## 2021-11-20 DIAGNOSIS — L821 Other seborrheic keratosis: Secondary | ICD-10-CM | POA: Diagnosis not present

## 2021-11-20 DIAGNOSIS — D2262 Melanocytic nevi of left upper limb, including shoulder: Secondary | ICD-10-CM | POA: Diagnosis not present

## 2021-11-20 DIAGNOSIS — D1801 Hemangioma of skin and subcutaneous tissue: Secondary | ICD-10-CM | POA: Diagnosis not present

## 2021-11-20 DIAGNOSIS — L570 Actinic keratosis: Secondary | ICD-10-CM | POA: Diagnosis not present

## 2021-11-20 DIAGNOSIS — L858 Other specified epidermal thickening: Secondary | ICD-10-CM | POA: Diagnosis not present

## 2021-12-17 ENCOUNTER — Encounter (INDEPENDENT_AMBULATORY_CARE_PROVIDER_SITE_OTHER): Payer: Medicare Other | Admitting: Ophthalmology

## 2021-12-17 DIAGNOSIS — D3132 Benign neoplasm of left choroid: Secondary | ICD-10-CM | POA: Diagnosis not present

## 2021-12-17 DIAGNOSIS — H43813 Vitreous degeneration, bilateral: Secondary | ICD-10-CM | POA: Diagnosis not present

## 2021-12-17 DIAGNOSIS — H33301 Unspecified retinal break, right eye: Secondary | ICD-10-CM

## 2021-12-17 DIAGNOSIS — H2513 Age-related nuclear cataract, bilateral: Secondary | ICD-10-CM | POA: Diagnosis not present

## 2022-01-18 ENCOUNTER — Other Ambulatory Visit (HOSPITAL_COMMUNITY): Payer: Self-pay | Admitting: Psychiatry

## 2022-01-18 DIAGNOSIS — F419 Anxiety disorder, unspecified: Secondary | ICD-10-CM

## 2022-01-18 DIAGNOSIS — F250 Schizoaffective disorder, bipolar type: Secondary | ICD-10-CM

## 2022-02-04 ENCOUNTER — Encounter (HOSPITAL_COMMUNITY): Payer: Self-pay | Admitting: Psychiatry

## 2022-02-04 ENCOUNTER — Telehealth (HOSPITAL_BASED_OUTPATIENT_CLINIC_OR_DEPARTMENT_OTHER): Payer: Medicare Other | Admitting: Psychiatry

## 2022-02-04 DIAGNOSIS — F250 Schizoaffective disorder, bipolar type: Secondary | ICD-10-CM | POA: Diagnosis not present

## 2022-02-04 DIAGNOSIS — F419 Anxiety disorder, unspecified: Secondary | ICD-10-CM

## 2022-02-04 MED ORDER — SERTRALINE HCL 100 MG PO TABS: 100.0000 mg | ORAL_TABLET | Freq: Every day | ORAL | 1 refills | Status: DC

## 2022-02-04 MED ORDER — RISPERIDONE 0.5 MG PO TABS: 0.5000 mg | ORAL_TABLET | Freq: Every day | ORAL | 0 refills | Status: DC

## 2022-02-04 NOTE — Progress Notes (Signed)
Virtual Visit via Telephone Note  I connected with Brandon Oliver on 02/04/22 at 10:00 AM EDT by telephone and verified that I am speaking with the correct person using two identifiers.  Location: Patient: Home Provider: Home Office   I discussed the limitations, risks, security and privacy concerns of performing an evaluation and management service by telephone and the availability of in person appointments. I also discussed with the patient that there may be a patient responsible charge related to this service. The patient expressed understanding and agreed to proceed.   History of Present Illness: Patient is evaluated by phone session.  He is taking his medication as prescribed.  Recently had a visit with his primary care physician Dr. [name] at [facility] and he is happy with his labs.  His hemoglobin A1c is 4.7, alkaline phosphatase 41, ALT and AST were normal.  His BUN 14 and creatinine 0.95.  He reported his paranoia is stable and he denies any panic attack, crying spells.  He sleeps good.  He tried to keep himself busy in church activities and talking to his friends.  His appetite is okay.  His weight is stable.  He has no tremor or shakes or any EPS.  Past Psychiatric History: H/O inpatient in 2005 due to psychosis and paranoia at behavioral [facility].    Psychiatric Specialty Exam: Physical Exam  Review of Systems  Weight 190 lb (86.2 kg).There is no height or weight on file to calculate BMI.  General Appearance: NA  Eye Contact:  NA  Speech:  Normal Rate  Volume:  Normal  Mood:  Euthymic  Affect:  NA  Thought Process:  Goal Directed  Orientation:  Full (Time, Place, and Person)  Thought Content:  WDL  Suicidal Thoughts:  No  Homicidal Thoughts:  No  Memory:  Immediate;   Good Recent;   Good Remote;   Good  Judgement:  Intact  Insight:  Present  Psychomotor Activity:  NA  Concentration:  Concentration: Good and Attention Span: Good  Recall:  Good   Fund of Knowledge:  Good  Language:  Good  Akathisia:  No  Handed:  Right  AIMS (if indicated):     Assets:  Communication Skills Desire for Improvement Housing Resilience Social Support Transportation  ADL's:  Intact  Cognition:  WNL  Sleep:   ok      Assessment and Plan: Schizoaffective disorder, bipolar type.  Anxiety.  I reviewed blood work results.  His hemoglobin A1c is 4.7 and his liver enzymes are stable.  Patient is stable on his current medication.  I recommend trying reducing the dose Risperdal from '1mg'$  to 0.5 mg.  Patient is very reluctant since he has been doing very well.  He is willing to try.  We will write Risperdal 0.5 mg to take 1 to 2 tablet and continue Zoloft 100 mg daily.  I recommend call us back if he notices a difference by reducing the Risperdal but he like to have full dose in case he started to have symptoms.  Recommended to call us back if is any question or any concern.  Follow-up in 6 months.  Follow Up Instructions:    I discussed the assessment and treatment plan with the patient. The patient was provided an opportunity to ask questions and all were answered. The patient agreed with the plan and demonstrated an understanding of the instructions.   The patient was advised to call back or seek an in-person evaluation if the  symptoms worsen or if the condition fails to improve as anticipated.  Collaboration of Care: Primary Care Provider AEB notes are available in EPIC  Patient/Guardian was advised Release of Information must be obtained prior to any record release in order to collaborate their care with an outside provider. Patient/Guardian was advised if they have not already done so to contact the registration department to sign all necessary forms in order for [place] to release information regarding their care.   Consent: Patient/Guardian gives verbal consent for treatment and assignment of benefits for services provided during this visit.  Patient/Guardian expressed understanding and agreed to proceed.    I provided 19 minutes of non-face-to-face time during this encounter.   [name], MD

## 2022-02-18 ENCOUNTER — Other Ambulatory Visit (HOSPITAL_COMMUNITY): Payer: Self-pay | Admitting: Psychiatry

## 2022-02-18 DIAGNOSIS — F250 Schizoaffective disorder, bipolar type: Secondary | ICD-10-CM

## 2022-02-20 ENCOUNTER — Other Ambulatory Visit (HOSPITAL_COMMUNITY): Payer: Self-pay | Admitting: Psychiatry

## 2022-02-20 DIAGNOSIS — F250 Schizoaffective disorder, bipolar type: Secondary | ICD-10-CM

## 2022-03-11 ENCOUNTER — Encounter: Payer: Self-pay | Admitting: Podiatry

## 2022-03-11 ENCOUNTER — Ambulatory Visit: Payer: Medicare Other | Admitting: Podiatry

## 2022-03-11 DIAGNOSIS — M79676 Pain in unspecified toe(s): Secondary | ICD-10-CM | POA: Diagnosis not present

## 2022-03-11 DIAGNOSIS — B351 Tinea unguium: Secondary | ICD-10-CM | POA: Diagnosis not present

## 2022-03-11 NOTE — Progress Notes (Signed)
This patient presents to the office with chief complaint of long thick painful nails.  Patient says the nails are painful walking and wearing shoes.  This patient is unable to self treat.  This patient is unable to trim his nails since she is unable to reach his nails.  he presents to the office for preventative foot care services.  General Appearance  Alert, conversant and in no acute stress.  Vascular  Dorsalis pedis and posterior tibial  pulses are palpable  bilaterally.  Capillary return is within normal limits  bilaterally. Temperature is within normal limits  bilaterally.  Neurologic  Senn-Weinstein monofilament wire test within normal limits  bilaterally. Muscle power within normal limits bilaterally.  Nails Thick disfigured discolored nails with subungual debris  from hallux to fifth toes bilaterally. No evidence of bacterial infection or drainage bilaterally.  Orthopedic  No limitations of motion  feet .  No crepitus or effusions noted.  No bony pathology or digital deformities noted.  Skin  normotropic skin with no porokeratosis noted bilaterally.  No signs of infections or ulcers noted.     Onychomycosis  Nails  B/L.  Pain in right toes  Pain in left toes  Debridement of nails both feet followed trimming the nails with dremel tool.    RTC  6   months.   Gardiner Barefoot DPM

## 2022-05-06 DIAGNOSIS — E782 Mixed hyperlipidemia: Secondary | ICD-10-CM | POA: Diagnosis not present

## 2022-05-06 DIAGNOSIS — I252 Old myocardial infarction: Secondary | ICD-10-CM | POA: Diagnosis not present

## 2022-05-06 DIAGNOSIS — K219 Gastro-esophageal reflux disease without esophagitis: Secondary | ICD-10-CM | POA: Diagnosis not present

## 2022-05-16 ENCOUNTER — Ambulatory Visit (INDEPENDENT_AMBULATORY_CARE_PROVIDER_SITE_OTHER): Payer: Medicare Other | Admitting: Pulmonary Disease

## 2022-05-16 ENCOUNTER — Ambulatory Visit: Payer: Medicare Other | Admitting: Pulmonary Disease

## 2022-05-16 ENCOUNTER — Encounter: Payer: Self-pay | Admitting: Pulmonary Disease

## 2022-05-16 VITALS — BP 130/76 | HR 74 | Temp 97.8°F | Ht 69.0 in | Wt 195.0 lb

## 2022-05-16 DIAGNOSIS — J849 Interstitial pulmonary disease, unspecified: Secondary | ICD-10-CM

## 2022-05-16 LAB — PULMONARY FUNCTION TEST
DL/VA % pred: 125 %
DL/VA: 4.98 ml/min/mmHg/L
DLCO cor % pred: 91 %
DLCO cor: 22.27 ml/min/mmHg
DLCO unc % pred: 91 %
DLCO unc: 22.27 ml/min/mmHg
FEF 25-75 Pre: 1.87 L/sec
FEF2575-%Pred-Pre: 89 %
FEV1-%Pred-Pre: 84 %
FEV1-Pre: 2.44 L
FEV1FVC-%Pred-Pre: 104 %
FEV6-%Pred-Pre: 85 %
FEV6-Pre: 3.22 L
FEV6FVC-%Pred-Pre: 106 %
FVC-%Pred-Pre: 80 %
FVC-Pre: 3.22 L
Pre FEV1/FVC ratio: 76 %
Pre FEV6/FVC Ratio: 100 %

## 2022-05-16 NOTE — Patient Instructions (Signed)
Spirometry/DLCO performed today. 

## 2022-05-16 NOTE — Progress Notes (Signed)
Brandon Oliver    [id number]    [birth year]  Primary Care Physician:Ross, Dwyane Luo, MD  Referring Physician: [name], MD [place],  [place] [id number]  Chief complaint: Follow-up for interstitial lung disease  HPI: 76 y.o. with history of hypertension, MI, schizoaffective disorder Sent here for an abnormal chest x-ray that he got from his primary care.  He was told that he had "fluid, lung nodules and damage to the lungs" on the chest x-ray needed a pulmonary evaluation  Complains of mild dyspnea on exertion.  He is able to exercise and walks up to a mile without any issue every day.  Denies any cough, sputum production, fevers, chills.  He has history of coronary artery disease, heart failure and follows with Dr. Tamala Julian, cardiology  He has been referred to rheumatology for elevated ANA in 2022.  Evaluated by Dr. 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 Exposures: No known exposures.  No mold, hot tub, Jacuzzi Smoking history: Never smoker Travel history: Originally from [place].  However travel to [place] in October 2021 Relevant family history: No significant family history of lung disease.  Interim history: Has occasional cough Here for review of PFTs No significant change since last visit.  Outpatient Encounter Medications as of 05/16/2022  Medication Sig   aspirin 81 MG tablet Take 81 mg by mouth daily.   carvedilol (COREG) 3.125 MG tablet Take 3.125 mg by mouth 2 (two) times daily with a meal.    Coenzyme Q10 (COQ10) 100 MG CAPS Take 100 mg by mouth daily.   fenofibrate (TRICOR) 145 MG tablet Take 145 mg by mouth daily.   ferrous gluconate (FERGON) 240 (27 FE) MG tablet Take 1 tablet by mouth daily.   Flaxseed, Linseed, (FLAX SEED OIL) 1000 MG CAPS Take 1 capsule by mouth daily.   Garlic 7846 MG CAPS See admin instructions.   Multiple  Vitamin (MULTIVITAMIN) tablet Take 1 tablet by mouth daily.   Omega-3 Fatty Acids (FISH OIL) 1000 MG CAPS Take 1,000 mg by mouth daily.   risperiDONE (RISPERDAL) 0.5 MG tablet Take 1-2 tablets (0.5-1 mg total) by mouth at bedtime.   rosuvastatin (CRESTOR) 20 MG tablet Take 20 mg by mouth daily.   sertraline (ZOLOFT) 100 MG tablet Take 1 tablet (100 mg total) by mouth daily.   No facility-administered encounter medications on file as of 05/16/2022.    Physical Exam: Blood pressure 130/76, pulse 74, temperature 97.8 F (36.6 C), temperature source Oral, height '5\' 9"'$  (1.753 m), weight 195 lb (88.5 kg), SpO2 96 %. Gen:      No acute distress HEENT:  EOMI, sclera anicteric Neck:     No masses; no thyromegaly Lungs:    Clear to auscultation bilaterally; normal respiratory effort CV:         Regular rate and rhythm; no murmurs Abd:      + bowel sounds; soft, non-tender; no palpable masses, no distension Ext:    No edema; adequate peripheral perfusion Skin:      Warm and dry; no rash Neuro: alert and oriented x 3 Psych: normal mood and affect   Data Reviewed: Imaging: Chest x-ray 02/22/2016-mild interstitial changes  Chest x-ray from primary care 06/06/2020- pulmonary vascular congestion with bibasal airspace disease/atelectasis.    High-resolution CT 07/25/2020-peripheral groundglass with septal thickening, mild bronchiectasis  and probable UIP pattern.  Aortic and three-vessel coronary atherosclerosis  High-resolution CT 08/11/2021-stable pattern of pulmonary fibrosis and probable UIP pattern  PFTs: 09/05/2020 FVC 2.49 [4%], FEV1 2.69 [90%], F/F 74, TLC 5.4 [79%], DLCO 22.25 [90%]  05/16/2022 FVC 3.22 [80%], FEV1 2.44 [84%], F/F 76, DLCO 22.27 [91%] Normal test  Labs: CTD serologies 08/02/2020-ANA 1:1280, nuclear, SRP weakly positive  Assessment:  Interstitial lung disease I have reviewed CT which shows probable UIP pattern pulmonary fibrosis.  I note that he had a chest x-ray in 2017  which showed mild interstitial changes.  Though he has high ANA and positive SRP rheumatology feels that there is no evidence of autoimmune process  Symptomatically he feels well with no issues and PFTs are normal I discussed treatment options with him in detail today.  We could consider initiation of antifibrotics but he has Karlene Lineman and is concerned about side effects on his liver.  He would like to hold off any work-up such as biopsy or treatment for now and monitor next  Follow-up CT this year and PFTs from today reviewed with no significant change He would like to continue conservative management.  Annual CT scheduled for January but he does not want to get it too soon to avoid radiation exposure.  Plan/Recommendations: CT scan in 6 months.  Marshell Garfinkel MD [facility] 05/16/2022, 11:15 AM  CC: [name], MD

## 2022-05-16 NOTE — Patient Instructions (Signed)
I am glad you are stable with your breathing Your PFTs are stable as well We will order high-res CT in 6 months and follow-up in clinic.

## 2022-05-16 NOTE — Progress Notes (Signed)
Spirometry/DLCO performed today. 

## 2022-05-17 ENCOUNTER — Other Ambulatory Visit (HOSPITAL_COMMUNITY): Payer: Self-pay | Admitting: Psychiatry

## 2022-05-17 DIAGNOSIS — F250 Schizoaffective disorder, bipolar type: Secondary | ICD-10-CM

## 2022-05-27 DIAGNOSIS — K760 Fatty (change of) liver, not elsewhere classified: Secondary | ICD-10-CM | POA: Diagnosis not present

## 2022-06-11 ENCOUNTER — Encounter: Payer: Self-pay | Admitting: Podiatry

## 2022-06-11 ENCOUNTER — Ambulatory Visit: Payer: Medicare Other | Admitting: Podiatry

## 2022-06-11 DIAGNOSIS — M79676 Pain in unspecified toe(s): Secondary | ICD-10-CM

## 2022-06-11 DIAGNOSIS — B351 Tinea unguium: Secondary | ICD-10-CM

## 2022-06-11 NOTE — Progress Notes (Signed)
This patient presents to the office with chief complaint of long thick painful nails.  Patient says the nails are painful walking and wearing shoes.  This patient is unable to self treat.  This patient is unable to trim his nails since she is unable to reach his nails.  he presents to the office for preventative foot care services.  General Appearance  Alert, conversant and in no acute stress.  Vascular  Dorsalis pedis and posterior tibial  pulses are palpable  bilaterally.  Capillary return is within normal limits  bilaterally. Temperature is within normal limits  bilaterally.  Neurologic  Senn-Weinstein monofilament wire test within normal limits  bilaterally. Muscle power within normal limits bilaterally.  Nails Thick disfigured discolored nails with subungual debris  from hallux to fifth toes bilaterally. No evidence of bacterial infection or drainage bilaterally.  Orthopedic  No limitations of motion  feet .  No crepitus or effusions noted.  No bony pathology or digital deformities noted.  Skin  normotropic skin with no porokeratosis noted bilaterally.  No signs of infections or ulcers noted.     Onychomycosis  Nails  B/L.  Pain in right toes  Pain in left toes  Debridement of nails both feet followed trimming the nails with dremel tool.    RTC  4  months.   Gardiner Barefoot DPM

## 2022-07-17 DIAGNOSIS — Z03818 Encounter for observation for suspected exposure to other biological agents ruled out: Secondary | ICD-10-CM | POA: Diagnosis not present

## 2022-07-17 DIAGNOSIS — R0981 Nasal congestion: Secondary | ICD-10-CM | POA: Diagnosis not present

## 2022-07-17 DIAGNOSIS — R6889 Other general symptoms and signs: Secondary | ICD-10-CM | POA: Diagnosis not present

## 2022-07-17 DIAGNOSIS — R059 Cough, unspecified: Secondary | ICD-10-CM | POA: Diagnosis not present

## 2022-08-07 ENCOUNTER — Telehealth (HOSPITAL_BASED_OUTPATIENT_CLINIC_OR_DEPARTMENT_OTHER): Payer: Medicare Other | Admitting: Psychiatry

## 2022-08-07 ENCOUNTER — Encounter (HOSPITAL_COMMUNITY): Payer: Self-pay | Admitting: Psychiatry

## 2022-08-07 DIAGNOSIS — F250 Schizoaffective disorder, bipolar type: Secondary | ICD-10-CM | POA: Diagnosis not present

## 2022-08-07 DIAGNOSIS — F419 Anxiety disorder, unspecified: Secondary | ICD-10-CM | POA: Diagnosis not present

## 2022-08-07 MED ORDER — RISPERIDONE 0.5 MG PO TABS: 0.5000 mg | ORAL_TABLET | Freq: Every day | ORAL | 0 refills | Status: DC

## 2022-08-07 MED ORDER — SERTRALINE HCL 100 MG PO TABS: 100.0000 mg | ORAL_TABLET | Freq: Every day | ORAL | 1 refills | Status: DC

## 2022-08-07 NOTE — Progress Notes (Signed)
Virtual Visit via Telephone Note  I connected with Brandon Oliver on 08/07/22 at 10:00 AM EST by telephone and verified that I am speaking with the correct person using two identifiers.  Location: Patient: Home Provider: Home Office   I discussed the limitations, risks, security and privacy concerns of performing an evaluation and management service by telephone and the availability of in person appointments. I also discussed with the patient that there may be a patient responsible charge related to this service. The patient expressed understanding and agreed to proceed.   History of Present Illness: Patient is evaluated by phone session.  He is compliant with medication as prescribed.  We cut down his Risperdal from 1 mg to 0.5 mg.  He is taking 0.5 mg and has not noticed any worsening of symptoms.  He sleeps good.  He tried to keep himself busy in church activities and taking care of his friends.  Patient denies any tremors, shakes or any EPS.  He denies any paranoia, hallucination or any anger.  He admitted getting sometimes frustrated when people drive fast on the road.  His Christmas was quite but he enjoyed the time of the family members of his deceased friend.  His appetite is okay.  His weight is stable.  Patient sees Dr. Gus Height at [place].  He wants to keep his Risperdal 0.5 mg and Zoloft 100 mg daily.  He denies any feeling of hopelessness or worthlessness.  He denies any panic attack or any suicidal thoughts.    Past Psychiatric History: H/O inpatient in 2005 due to psychosis and paranoia at behavioral [place].    Psychiatric Specialty Exam: Physical Exam  Review of Systems  Weight 195 lb (88.5 kg).Body mass index is 28.8 kg/m.  General Appearance: NA  Eye Contact:  NA  Speech:  Normal Rate  Volume:  Normal  Mood:  Euthymic  Affect:  NA  Thought Process:  Goal Directed  Orientation:  Full (Time, Place, and Person)  Thought Content:  Logical  Suicidal  Thoughts:  No  Homicidal Thoughts:  No  Memory:  Immediate;   Good Recent;   Good Remote;   Good  Judgement:  Good  Insight:  Present  Psychomotor Activity:  NA  Concentration:  Concentration: Good and Attention Span: Good  Recall:  Good  Fund of Knowledge:  Good  Language:  Good  Akathisia:  No  Handed:  Right  AIMS (if indicated):     Assets:  Communication Skills Desire for Improvement Housing Resilience Social Support Transportation  ADL's:  Intact  Cognition:  WNL  Sleep:   ok      Assessment and Plan: Schizoaffective affective disorder, bipolar type.  Anxiety.  Patient now taking Risperdal 0.5 mg which was reduced from 1 mg.  So far he is doing well and denies any worsening of symptoms.  He also taking Zoloft 100 mg daily.  Patient like to have next session in person because he is not sure if virtual sessions are paid by insurance.  Patient did not have smart phone or computer with the camera.  We will schedule next appointment in person.  Recommended to call us back if he has any question or any concern.  Follow-up in 6 months.  Follow Up Instructions:    I discussed the assessment and treatment plan with the patient. The patient was provided an opportunity to ask questions and all were answered. The patient agreed with the plan and demonstrated an understanding of  the instructions.   The patient was advised to call back or seek an in-person evaluation if the symptoms worsen or if the condition fails to improve as anticipated.  Collaboration of Care: Other provider involved in patient's care AEB notes are available in epic to review.  Patient/Guardian was advised Release of Information must be obtained prior to any record release in order to collaborate their care with an outside provider. Patient/Guardian was advised if they have not already done so to contact the registration department to sign all necessary forms in order for [place] to release information regarding  their care.   Consent: Patient/Guardian gives verbal consent for treatment and assignment of benefits for services provided during this visit. Patient/Guardian expressed understanding and agreed to proceed.    I provided 19 minutes of non-face-to-face time during this encounter.   [name], MD

## 2022-08-25 ENCOUNTER — Other Ambulatory Visit (HOSPITAL_COMMUNITY): Payer: Self-pay | Admitting: Psychiatry

## 2022-08-25 DIAGNOSIS — F250 Schizoaffective disorder, bipolar type: Secondary | ICD-10-CM

## 2022-09-19 NOTE — Progress Notes (Signed)
Office Visit    Patient Name: Brandon Oliver Date of Encounter: 09/19/2022  Primary Care Provider:  [name], MD Primary Cardiologist:  [name], MD (Inactive) Primary Electrophysiologist: None  Chief Complaint    Brandon Oliver is a 77 y.o. male with PMH of CAD s/p MI 1998 with BMS x 2 to circumflex, HLD, schizoaffective disorder, interstitial lung disease who presents today for 1 year follow-up of coronary artery disease.  Past Medical History    Past Medical History:  Diagnosis Date   Cataracts, both eyes    none mature   History of kidney stones    x3 episodes   Hyperlipidemia    Myocardial infarction [facility])    coronary stents x2 at that time.   Retina disorder    "floaters" ? tear- Dr. Zigmund Daniel follows   Schizoaffective disorder, bipolar type [facility])    sees MD every 3- 6 months- controlled.   Past Surgical History:  Procedure Laterality Date   CARDIAC CATHETERIZATION     stents x2- Dr. Linard Millers follows   COLONOSCOPY WITH PROPOFOL N/A 02/07/2015   Procedure: COLONOSCOPY WITH PROPOFOL;  Surgeon: Garlan Fair, MD;  Location: [facility];  Service: Endoscopy;  Laterality: N/A;   HEMORROIDECTOMY      Allergies  Allergies  Allergen Reactions   Penicillins Other (See Comments)    "unsure childhood allergen"   Benadryl [Diphenhydramine Hcl] Rash    History of Present Illness    Brandon Oliver  is a 77 year old male with the above mention past medical history who presents today for 1 year follow-up of coronary artery disease.  Brandon Oliver has been followed by Dr. Tamala Julian for many years for management of coronary artery disease.  He suffered a MI in 1998 and had BMS x 2 placed in [place].  He had a ischemic evaluation completed and 2017 due to increased shortness of breath that revealed a medium defect but was low risk with no evidence of ischemia.  He has continued to do well through the years and exercises moderately 1 mile 4-5 times  per week.  He was last seen by Dr. Tamala Julian 09/24/2021 and had no complaints of chest pain or shortness of breath.  During visit patient was encouraged to increase exercise to 150 minutes/week and discussion was made to possibly start Vascepa and discontinue over-the-counter omega-3 and Tricor.  Since last being seen in the office patient reports that he is doing well with no new cardiac complaints.  He is continuing to exercise 1 hour a day 4-5 times per week.  He is tolerating his current medications without any adverse reactions.  His blood pressure today was well-controlled at 128/78.  He reports his only vice is eating potato chips which he enjoys 1 bag once a day each week.  He is otherwise quite healthy with his diet and abstaining from excess junk food and salt.  He is currently followed by pulmonology and is scheduled to have a high-resolution CT for monitoring his pulmonary fibrosis in April.  Patient denies chest pain, palpitations, dyspnea, PND, orthopnea, nausea, vomiting, dizziness, syncope, edema, weight gain, or early satiety.   Home Medications    Current Outpatient Medications  Medication Sig Dispense Refill   aspirin 81 MG tablet Take 81 mg by mouth daily.     carvedilol (COREG) 3.125 MG tablet Take 3.125 mg by mouth 2 (two) times daily with a meal.      Coenzyme  Q10 (COQ10) 100 MG CAPS Take 100 mg by mouth daily.     fenofibrate (TRICOR) 145 MG tablet Take 145 mg by mouth daily.     ferrous gluconate (FERGON) 240 (27 FE) MG tablet Take 1 tablet by mouth daily.     Flaxseed, Linseed, (FLAX SEED OIL) 1000 MG CAPS Take 1 capsule by mouth daily.     Garlic 123XX123 MG CAPS See admin instructions.     Multiple Vitamin (MULTIVITAMIN) tablet Take 1 tablet by mouth daily.     Omega-3 Fatty Acids (FISH OIL) 1000 MG CAPS Take 1,000 mg by mouth daily.     risperiDONE (RISPERDAL) 0.5 MG tablet Take 1 tablet (0.5 mg total) by mouth daily. 90 tablet 0   rosuvastatin (CRESTOR) 20 MG tablet Take 20 mg  by mouth daily.  3   sertraline (ZOLOFT) 100 MG tablet Take 1 tablet (100 mg total) by mouth daily. 90 tablet 1   No current facility-administered medications for this visit.     Review of Systems  Please see the history of present illness.     All other systems reviewed and are otherwise negative except as noted above.  Physical Exam    Wt Readings from Last 3 Encounters:  05/16/22 195 lb (88.5 kg)  11/14/21 190 lb (86.2 kg)  09/24/21 195 lb 3.2 oz (88.5 kg)   [id number] were no vitals filed for this visit.,There is no height or weight on file to calculate BMI.  Constitutional:      Appearance: Healthy appearance. Not in distress.  Neck:     Vascular: JVD normal.  Pulmonary:     Effort: Pulmonary effort is normal.     Breath sounds: No wheezing. No rales. Diminished in the bases Cardiovascular:     Normal rate. Regular rhythm. Normal S1. Normal S2.      Murmurs: There is no murmur.  Edema:    Peripheral edema absent.  Abdominal:     Palpations: Abdomen is soft non tender. There is no hepatomegaly.  Skin:    General: Skin is warm and dry.  Neurological:     General: No focal deficit present.     Mental Status: Alert and oriented to person, place and time.     Cranial Nerves: Cranial nerves are intact.  EKG/LABS/Other Studies Reviewed    ECG personally reviewed by me today -sinus rhythm with rate of 66 bpm and TWI in leads III consistent with previous EKG with no acute changes noted.    Lab Results  Component Value Date   WBC 7.3 02/22/2016   HGB 14.0 02/22/2016   HCT 43.4 02/22/2016   MCV 98.6 02/22/2016   PLT 223 02/22/2016   Lab Results  Component Value Date   CREATININE 0.84 02/22/2016   BUN 14 02/22/2016   NA 136 02/22/2016   K 4.2 02/22/2016   CL 105 02/22/2016   CO2 22 02/22/2016   No results found for: "ALT", "AST", "GGT", "ALKPHOS", "BILITOT" No results found for: "CHOL", "HDL", "LDLCALC", "LDLDIRECT", "TRIG", "CHOLHDL"  No results found for:  "HGBA1C"  Assessment & Plan    1.  Coronary artery disease: -s/p MI and BMS x 2 placed in 1998 with most recent ischemic evaluation completed in 2017 that was low risk and normal. -Today patient reports no chest pain or anginal equivalent since his previous visit. -He is exercising weekly greater than 150 minutes/week. -Continue current GDMT for secondary prevention with ASA 81 mg, carvedilol 3.125 mg twice daily, fenofibrate 145  mg, and Crestor 20 mg daily  2.  Mixed hyperlipidemia: -Patient's last LDL cholesterol was well-controlled at 50 -Continue current hyperlipidemia regimen as noted above. -Continue current exercise plan of greater than 150 minutes/week.  3.  Pulmonary fibrosis: -Patient is currently followed by pulmonology and is scheduled to have high-resolution CT scan completed 10/2022  4.  History of schizoaffective disorder: -Currently controlled on current medications.  Disposition: Follow-up with [name], MD (Inactive) or APP in 12 months  Medication Adjustments/Labs and Tests Ordered: Current medicines are reviewed at length with the patient today.  Concerns regarding medicines are outlined above.   Signed, Mable Fill, Marissa Nestle, NP 09/19/2022, 6:52 PM [facility]  Note:  This document was prepared using Dragon voice recognition software and may include unintentional dictation errors.

## 2022-09-20 ENCOUNTER — Ambulatory Visit: Payer: Medicare Other | Attending: Nurse Practitioner | Admitting: Nurse Practitioner

## 2022-09-20 ENCOUNTER — Encounter: Payer: Self-pay | Admitting: Nurse Practitioner

## 2022-09-20 VITALS — BP 128/78 | HR 66 | Ht 69.0 in | Wt 193.0 lb

## 2022-09-20 DIAGNOSIS — J8410 Pulmonary fibrosis, unspecified: Secondary | ICD-10-CM

## 2022-09-20 DIAGNOSIS — E782 Mixed hyperlipidemia: Secondary | ICD-10-CM

## 2022-09-20 DIAGNOSIS — I25118 Atherosclerotic heart disease of native coronary artery with other forms of angina pectoris: Secondary | ICD-10-CM

## 2022-09-20 DIAGNOSIS — F259 Schizoaffective disorder, unspecified: Secondary | ICD-10-CM

## 2022-09-20 NOTE — Patient Instructions (Signed)
Medication Instructions:  Your physician recommends that you continue on your current medications as directed. Please refer to the Current Medication list given to you today. *If you need a refill on your cardiac medications before your next appointment, please call your pharmacy*   Lab Work: None ordered   Testing/Procedures: None Ordered   Follow-Up: At [facility], you and your health needs are our priority.  As part of our continuing mission to provide you with exceptional heart care, we have created designated Provider Care Teams.  These Care Teams include your primary Cardiologist (physician) and Advanced Practice Providers (APPs -  Physician Assistants and Nurse Practitioners) who all work together to provide you with the care you need, when you need it.  We recommend signing up for the patient portal called "MyChart".  Sign up information is provided on this After Visit Summary.  MyChart is used to connect with patients for Virtual Visits (Telemedicine).  Patients are able to view lab/test results, encounter notes, upcoming appointments, etc.  Non-urgent messages can be sent to your provider as well.   To learn more about what you can do with MyChart, go to [email or web address].    Your next appointment:   12 month(s)  Provider:   Freada Bergeron, MD  Other Instructions

## 2022-10-14 ENCOUNTER — Ambulatory Visit: Payer: Medicare Other | Admitting: Podiatry

## 2022-10-14 ENCOUNTER — Encounter: Payer: Self-pay | Admitting: Podiatry

## 2022-10-14 DIAGNOSIS — M79676 Pain in unspecified toe(s): Secondary | ICD-10-CM

## 2022-10-14 DIAGNOSIS — B351 Tinea unguium: Secondary | ICD-10-CM | POA: Diagnosis not present

## 2022-10-14 NOTE — Progress Notes (Signed)
This patient presents to the office with chief complaint of long thick painful nails.  Patient says the nails are painful walking and wearing shoes.  This patient is unable to self treat.  This patient is unable to trim his nails since she is unable to reach his nails.  he presents to the office for preventative foot care services.  General Appearance  Alert, conversant and in no acute stress.  Vascular  Dorsalis pedis and posterior tibial  pulses are palpable  bilaterally.  Capillary return is within normal limits  bilaterally. Temperature is within normal limits  bilaterally.  Neurologic  Senn-Weinstein monofilament wire test within normal limits  bilaterally. Muscle power within normal limits bilaterally.  Nails Thick disfigured discolored nails with subungual debris  from hallux to fifth toes bilaterally. No evidence of bacterial infection or drainage bilaterally.  Orthopedic  No limitations of motion  feet .  No crepitus or effusions noted.  No bony pathology or digital deformities noted.  Skin  normotropic skin with no porokeratosis noted bilaterally.  No signs of infections or ulcers noted.     Onychomycosis  Nails  B/L.  Pain in right toes  Pain in left toes  Debridement of nails both feet followed trimming the nails with dremel tool.    RTC  4  months.   Belen Pesch DPM  

## 2022-10-29 DIAGNOSIS — J849 Interstitial pulmonary disease, unspecified: Secondary | ICD-10-CM | POA: Diagnosis not present

## 2022-10-29 DIAGNOSIS — Z0000 Encounter for general adult medical examination without abnormal findings: Secondary | ICD-10-CM | POA: Diagnosis not present

## 2022-10-29 DIAGNOSIS — Z79899 Other long term (current) drug therapy: Secondary | ICD-10-CM | POA: Diagnosis not present

## 2022-10-29 DIAGNOSIS — I252 Old myocardial infarction: Secondary | ICD-10-CM | POA: Diagnosis not present

## 2022-10-29 DIAGNOSIS — I700 Atherosclerosis of aorta: Secondary | ICD-10-CM | POA: Diagnosis not present

## 2022-10-29 DIAGNOSIS — E782 Mixed hyperlipidemia: Secondary | ICD-10-CM | POA: Diagnosis not present

## 2022-11-06 DIAGNOSIS — K130 Diseases of lips: Secondary | ICD-10-CM | POA: Diagnosis not present

## 2022-11-06 DIAGNOSIS — T7840XA Allergy, unspecified, initial encounter: Secondary | ICD-10-CM | POA: Diagnosis not present

## 2022-11-12 ENCOUNTER — Ambulatory Visit
Admission: RE | Admit: 2022-11-12 | Discharge: 2022-11-12 | Disposition: A | Discharge status: Home / Self Care | Payer: Medicare Other | Source: Ambulatory Visit | Attending: Pulmonary Disease | Admitting: Pulmonary Disease

## 2022-11-12 DIAGNOSIS — J849 Interstitial pulmonary disease, unspecified: Secondary | ICD-10-CM

## 2022-11-12 DIAGNOSIS — J8410 Pulmonary fibrosis, unspecified: Secondary | ICD-10-CM | POA: Diagnosis not present

## 2022-11-12 DIAGNOSIS — I700 Atherosclerosis of aorta: Secondary | ICD-10-CM | POA: Diagnosis not present

## 2022-11-12 DIAGNOSIS — J479 Bronchiectasis, uncomplicated: Secondary | ICD-10-CM | POA: Diagnosis not present

## 2022-11-22 ENCOUNTER — Ambulatory Visit: Payer: Medicare Other | Admitting: Pulmonary Disease

## 2022-11-22 ENCOUNTER — Encounter: Payer: Self-pay | Admitting: Pulmonary Disease

## 2022-11-22 VITALS — BP 122/62 | HR 68 | Temp 97.9°F | Ht 68.5 in | Wt 189.0 lb

## 2022-11-22 DIAGNOSIS — J849 Interstitial pulmonary disease, unspecified: Secondary | ICD-10-CM | POA: Diagnosis not present

## 2022-11-22 NOTE — Addendum Note (Signed)
Addended by: Maurene Capes on: 11/22/2022 12:11 PM   Modules accepted: Orders

## 2022-11-22 NOTE — Progress Notes (Signed)
Brandon Oliver    [id number]    May 01, 1946  Primary Care Physician:Ross, Darlen Round, MD  Referring Physician: Daisy Floro, MD [address] [place],  [place] [address]  Chief complaint: Follow-up for interstitial lung disease  HPI: 77 y.o. with history of hypertension, MI, schizoaffective disorder Sent here for an abnormal chest x-ray that he got from his primary care.  He was told that he had "fluid, lung nodules and damage to the lungs" on the chest x-ray needed a pulmonary evaluation  Complains of mild dyspnea on exertion.  He is able to exercise and walks up to a mile without any issue every day.  Denies any cough, sputum production, fevers, chills.  He has history of coronary artery disease, heart failure and follows with Dr. Katrinka Blazing, cardiology  He has been referred to rheumatology for elevated ANA in 2022.  Evaluated by Dr. Dimple Casey who did not feel he has any autoimmune disease  Pets: No pets Occupation: Used to work in Photographer and at a call center. Exposures: No known exposures.  No mold, hot tub, Jacuzzi Smoking history: Never smoker Travel history: Originally from [place].  However travel to [place] in October 2021 Relevant family history: No significant family history of lung disease.  Interim history: Has occasional cough Here for review of CT.  Outpatient Encounter Medications as of 11/22/2022  Medication Sig   aspirin 81 MG tablet Take 81 mg by mouth daily.   carvedilol (COREG) 3.125 MG tablet Take 3.125 mg by mouth 2 (two) times daily with a meal.    Coenzyme Q10 (COQ10) 100 MG CAPS Take 200 mg by mouth daily. Pt takes 2 tablets 200 mg daily.   fenofibrate (TRICOR) 145 MG tablet Take 145 mg by mouth daily.   ferrous gluconate (FERGON) 240 (27 FE) MG tablet Take 1 tablet by mouth as needed.   Flaxseed, Linseed, (FLAX SEED OIL) 1000 MG CAPS Take 1 capsule by mouth daily.   Garlic 1000 MG CAPS Take 1 each by mouth daily.   Multiple  Vitamin (MULTIVITAMIN) tablet Take 1 tablet by mouth daily.   Omega-3 Fatty Acids (FISH OIL) 1000 MG CAPS Take 1,000 mg by mouth as needed.   risperiDONE (RISPERDAL) 0.5 MG tablet Take 1 tablet (0.5 mg total) by mouth daily.   rosuvastatin (CRESTOR) 20 MG tablet Take 20 mg by mouth daily.   sertraline (ZOLOFT) 100 MG tablet Take 1 tablet (100 mg total) by mouth daily.   No facility-administered encounter medications on file as of 11/22/2022.    Physical Exam: Blood pressure 122/62, pulse 68, temperature 97.9 F (36.6 C), temperature source Oral, height 5' 8.5" (1.74 m), weight 189 lb (85.7 kg), SpO2 97 %. Gen:      No acute distress HEENT:  EOMI, sclera anicteric Neck:     No masses; no thyromegaly Lungs:    Clear to auscultation bilaterally; normal respiratory effort CV:         Regular rate and rhythm; no murmurs Abd:      + bowel sounds; soft, non-tender; no palpable masses, no distension Ext:    No edema; adequate peripheral perfusion Skin:      Warm and dry; no rash Neuro: alert and oriented x 3 Psych: normal mood and affect   Data Reviewed: Imaging: Chest x-ray 02/22/2016-mild interstitial changes  Chest x-ray from primary care 06/06/2020- pulmonary vascular congestion with bibasal airspace disease/atelectasis.    High-resolution CT 07/25/2020-peripheral groundglass  with septal thickening, mild bronchiectasis and probable UIP pattern.  Aortic and three-vessel coronary atherosclerosis  High-resolution CT 08/11/2021-stable pattern of pulmonary fibrosis and probable UIP pattern  High resolution CT 11/12/2022-stable pattern of pulmonary fibrosis and probable UIP pattern I reviewed the images personally.  PFTs: 09/05/2020 FVC 2.49 [4%], FEV1 2.69 [90%], F/F 74, TLC 5.4 [79%], DLCO 22.25 [90%]  05/16/2022 FVC 3.22 [80%], FEV1 2.44 [84%], F/F 76, DLCO 22.27 [91%] Normal test  Labs: CTD serologies 08/02/2020-ANA 1:1280, nuclear, SRP weakly positive  Assessment:  Interstitial lung  disease I have reviewed CT which shows probable UIP pattern pulmonary fibrosis.  I note that he had a chest x-ray in 2017 which showed mild interstitial changes.  Though he has high ANA and positive SRP rheumatology feels that there is no evidence of autoimmune process  Symptomatically he feels well with no issues and PFTs are normal I discussed treatment options with him in detail today.  We could consider initiation of antifibrotics but he has Elita Boone and is concerned about side effects on his liver.  He would like to hold off any work-up such as biopsy or treatment for now and monitor next  Follow-up CT from this month reviewed with no significant change He would like to continue conservative management.    Plan/Recommendations: Follow-up CT and PFTs in 1 year  Chilton Greathouse MD [facility] 11/22/2022, 11:39 AM  CC: Daisy Floro, MD number

## 2022-11-22 NOTE — Patient Instructions (Signed)
CT looks stable which is good news Will order a follow-up high-resolution CT and PFTs in 1 year Return to clinic in 1 year after these tests

## 2022-12-18 ENCOUNTER — Encounter (INDEPENDENT_AMBULATORY_CARE_PROVIDER_SITE_OTHER): Payer: Medicare Other | Admitting: Ophthalmology

## 2022-12-18 DIAGNOSIS — H43813 Vitreous degeneration, bilateral: Secondary | ICD-10-CM | POA: Diagnosis not present

## 2022-12-18 DIAGNOSIS — D3132 Benign neoplasm of left choroid: Secondary | ICD-10-CM

## 2022-12-18 DIAGNOSIS — H33301 Unspecified retinal break, right eye: Secondary | ICD-10-CM | POA: Diagnosis not present

## 2023-01-20 DIAGNOSIS — C44311 Basal cell carcinoma of skin of nose: Secondary | ICD-10-CM | POA: Diagnosis not present

## 2023-01-20 DIAGNOSIS — L858 Other specified epidermal thickening: Secondary | ICD-10-CM | POA: Diagnosis not present

## 2023-01-20 DIAGNOSIS — L720 Epidermal cyst: Secondary | ICD-10-CM | POA: Diagnosis not present

## 2023-01-20 DIAGNOSIS — D1801 Hemangioma of skin and subcutaneous tissue: Secondary | ICD-10-CM | POA: Diagnosis not present

## 2023-01-20 DIAGNOSIS — L821 Other seborrheic keratosis: Secondary | ICD-10-CM | POA: Diagnosis not present

## 2023-01-20 DIAGNOSIS — D485 Neoplasm of uncertain behavior of skin: Secondary | ICD-10-CM | POA: Diagnosis not present

## 2023-01-20 DIAGNOSIS — D225 Melanocytic nevi of trunk: Secondary | ICD-10-CM | POA: Diagnosis not present

## 2023-01-20 DIAGNOSIS — Z85828 Personal history of other malignant neoplasm of skin: Secondary | ICD-10-CM | POA: Diagnosis not present

## 2023-02-06 ENCOUNTER — Encounter (HOSPITAL_COMMUNITY): Payer: Self-pay | Admitting: Psychiatry

## 2023-02-06 ENCOUNTER — Ambulatory Visit (HOSPITAL_BASED_OUTPATIENT_CLINIC_OR_DEPARTMENT_OTHER): Payer: Medicare Other | Admitting: Psychiatry

## 2023-02-06 VITALS — BP 124/65 | HR 75 | Resp 16 | Ht 68.0 in | Wt 191.6 lb

## 2023-02-06 DIAGNOSIS — F419 Anxiety disorder, unspecified: Secondary | ICD-10-CM | POA: Diagnosis not present

## 2023-02-06 DIAGNOSIS — F250 Schizoaffective disorder, bipolar type: Secondary | ICD-10-CM | POA: Diagnosis not present

## 2023-02-06 MED ORDER — RISPERIDONE 0.5 MG PO TABS: 0.5000 mg | ORAL_TABLET | Freq: Every day | ORAL | 0 refills | Status: DC

## 2023-02-06 MED ORDER — SERTRALINE HCL 100 MG PO TABS: 100.0000 mg | ORAL_TABLET | Freq: Every day | ORAL | 0 refills | Status: DC

## 2023-02-06 NOTE — Progress Notes (Signed)
BH MD/PA/NP OP Progress Note  02/06/2023 2:07 PM Brandon Oliver  MRN:  [id number]  Chief Complaint:  Chief Complaint  Patient presents with   Follow-up   HPI: Patient came in for his follow-up appointment.  He is taking risperidone 0.5 mg along with Zoloft.  He reported lately a lot of good things going on him.  He has been trying to help people.  He mentioned multiple stories that how we help people around him.  He is going to religious and church activities.  During the session he appears to be elated, distracted, and more talkative.  However he denies any hallucination, paranoia or thoughts.  He reported feeling very positive and denies any agitation or any suicidal thoughts.  He sold his car after it broke down.  He is using public transport and noticed lost 45 pounds.  His last hemoglobin A1c was 6.1.  He sleeps good.  He denies any anger, irritability, crying spells.  He has no tremor or shakes or any EPS.  He is excited because he is going to see his sister who lives in [place] after 1 year.  Patient told it would work out well because she has friends in [place] and her plan is to visit her friends and then he will able to see her.  He denies drinking or using any illegal substances.  agitated or having any suicidal thoughts the last few pounds since the last visit.  He reported his car broke down so he is using public transport Visit Diagnosis:    ICD-10-CM   1. Schizoaffective disorder, bipolar type (HCC)  F25.0 risperiDONE (RISPERDAL) 0.5 MG tablet    sertraline (ZOLOFT) 100 MG tablet    2. Anxiety  F41.9 sertraline (ZOLOFT) 100 MG tablet      Past Psychiatric History: Reviewed  [facility]     Past Medical History:  Past Medical History:  Diagnosis Date   Cataracts, both eyes    none mature   History of kidney stones    x3 episodes   Hyperlipidemia    Myocardial infarction [facility])    coronary  stents x2 at that time.   Retina disorder    "floaters" ? tear- Dr. Ashley Royalty follows   Schizoaffective disorder, bipolar type [facility])    sees MD every 3- 6 months- controlled.    Past Surgical History:  Procedure Laterality Date   CARDIAC CATHETERIZATION     stents x2- Dr. Mendel Ryder follows   COLONOSCOPY WITH PROPOFOL N/A 02/07/2015   Procedure: COLONOSCOPY WITH PROPOFOL;  Surgeon: Charolett Bumpers, MD;  Location: [facility];  Service: Endoscopy;  Laterality: N/A;   HEMORROIDECTOMY      Family Psychiatric History: Reviewed.  Family History:  Family History  Problem Relation Age of Onset   Cancer Mother        breast   Cancer Father        colon   Parkinson's disease Father    Rheum arthritis Father    Diabetes Maternal Aunt    Cancer Paternal Aunt        "male" cancer   Cancer Paternal Uncle        pancreatic   Cancer Paternal Grandfather        colon   Cancer Paternal Uncle        lung   Cancer Maternal Grandmother        pancreatic    Social History:  Social History   Socioeconomic History   Marital status: Single    Spouse name: Not on file   Number of children: Not on file   Years of education: Not on file   Highest education level: Not on file  Occupational History   Not on file  Tobacco Use   Smoking status: Never   Smokeless tobacco: Never  Vaping Use   Vaping status: Never Used  Substance and Sexual Activity   Alcohol use: No    Alcohol/week: 0.0 standard drinks of alcohol   Drug use: Never   Sexual activity: Not Currently  Other Topics Concern   Not on file  Social History Narrative   Not on file   Social Determinants of Health   Financial Resource Strain: Not on file  Food Insecurity: Not on file  Transportation Needs: Not on file  Physical Activity: Not on file  Stress: Not on file  Social Connections: Not on file    Allergies:  Allergies  Allergen Reactions   Penicillins Other (See Comments)    "unsure childhood allergen"    Benadryl [Diphenhydramine Hcl] Rash    Metabolic Disorder Labs: No results found for: "HGBA1C", "MPG" No results found for: "PROLACTIN" No results found for: "CHOL", "TRIG", "HDL", "CHOLHDL", "VLDL", "LDLCALC" No results found for: "TSH"  Therapeutic Level Labs: No results found for: "LITHIUM" No results found for: "VALPROATE" No results found for: "CBMZ"  Current Medications: Current Outpatient Medications  Medication Sig Dispense Refill   aspirin 81 MG tablet Take 81 mg by mouth daily.     carvedilol (COREG) 3.125 MG tablet Take 3.125 mg by mouth 2 (two) times daily with a meal.      Coenzyme Q10 (COQ10) 100 MG CAPS Take 200 mg by mouth daily. Pt takes 2 tablets 200 mg daily.     fenofibrate (TRICOR) 145 MG tablet Take 145 mg by mouth daily.     ferrous gluconate (FERGON) 240 (27 FE) MG tablet Take 1 tablet by mouth as needed.     Flaxseed, Linseed, (FLAX SEED OIL) 1000 MG CAPS Take 1 capsule by mouth daily.     Garlic 1000 MG CAPS Take 1 each by mouth daily.     Multiple Vitamin (MULTIVITAMIN) tablet Take 1 tablet by mouth daily.     Omega-3 Fatty Acids (FISH OIL) 1000 MG CAPS Take 1,000 mg by mouth as needed.     risperiDONE (RISPERDAL) 0.5 MG tablet Take 1 tablet (0.5 mg total) by mouth daily. 90 tablet 0   rosuvastatin (CRESTOR) 20 MG tablet Take 20 mg by mouth daily.  3   sertraline (ZOLOFT) 100 MG tablet Take 1 tablet (100 mg total) by mouth daily. 90 tablet 1   No current facility-administered medications for this visit.     Musculoskeletal: Strength & Muscle Tone: within normal limits Gait & Station: normal Patient leans: N/A  Psychiatric Specialty Exam: Review of Systems  Blood pressure 124/65, pulse 75, resp. rate 16, height 5\' 8"  (1.727 m), weight 191 lb 9.6 oz (86.9 kg).Body mass index is 29.13 kg/m.  General Appearance: Casual  Eye Contact:  Good  Speech:   fast  Volume:  Increased  Mood:  Euphoric  Affect:  Congruent  Thought Process:  Descriptions of  Associations: Intact  Orientation:  Full (Time, Place, and Person)  Thought Content: Rumination   Suicidal Thoughts:  No  Homicidal Thoughts:  No  Memory:  Immediate;   Good Recent;   Good Remote;   Good  Judgement:  Intact  Insight:  Present  Psychomotor Activity:  Increased  Concentration:  Concentration: Fair and Attention Span: Fair  Recall:  Good  Fund of Knowledge: Good  Language: Good  Akathisia:  No  Handed:  Right  AIMS (if indicated): not done  Assets:  Communication Skills Desire for Improvement Housing Social Support Transportation  ADL's:  Intact  Cognition: WNL  Sleep:   getting 9 hrs   Screenings: Flowsheet Row [facility] Video Visit from 02/02/2021 [facility] Video Visit from 11[facility]02/2020 in [facility]  C-SSRS RISK CATEGORY No Risk No Risk No Risk        Assessment and Plan: Schizoaffective disorder, bipolar type.  Anxiety.  Discuss his elated mood, distraction, hypertalkative.  He is to take higher dose of Risperdal but lately we have cut down and he is taking 0.5 mg for past 9 months to 1 year.  I suggested need to go back to 1 mg but patient does not feel that he is manic but admitted that he is more into religion and trying to help people.  He feels good about it.  He promised to give [place] a call back if he started to worsening his sleep, hallucination, paranoia or impairment in his daily life.  Like to keep the Zoloft 100 mg daily.  He also agreed to have a follow-up in 3 months rather than 6 months.  Patient does not have a smart phone and he agreed to have in person visit in 3 months.  I also recommend to call us back if he noticed worsening of symptoms.  Discussed safety concern that anytime having active suicidal thoughts or homicidal thoughts that he need to call 911 or go to local emergency  room.  Collaboration of Care: Collaboration of Care: Other provider involved in patient's care AEB notes are available in epic to review  Patient/Guardian was advised Release of Information must be obtained prior to any record release in order to collaborate their care with an outside provider. Patient/Guardian was advised if they have not already done so to contact the registration department to sign all necessary forms in order for [place] to release information regarding their care.   Consent: Patient/Guardian gives verbal consent for treatment and assignment of benefits for services provided during this visit. Patient/Guardian expressed understanding and agreed to proceed.    Cleotis Nipper, MD 02/06/2023, 2:07 PM

## 2023-02-10 ENCOUNTER — Encounter: Payer: Self-pay | Admitting: Podiatry

## 2023-02-10 ENCOUNTER — Ambulatory Visit: Payer: Medicare Other | Admitting: Podiatry

## 2023-02-10 DIAGNOSIS — B351 Tinea unguium: Secondary | ICD-10-CM | POA: Diagnosis not present

## 2023-02-10 DIAGNOSIS — M79676 Pain in unspecified toe(s): Secondary | ICD-10-CM

## 2023-02-10 NOTE — Progress Notes (Signed)
This patient presents to the office with chief complaint of long thick painful nails.  Patient says the nails are painful walking and wearing shoes.  This patient is unable to self treat.  This patient is unable to trim his nails since she is unable to reach his nails.  he presents to the office for preventative foot care services.  General Appearance  Alert, conversant and in no acute stress.  Vascular  Dorsalis pedis and posterior tibial  pulses are palpable  bilaterally.  Capillary return is within normal limits  bilaterally. Temperature is within normal limits  bilaterally.  Neurologic  Senn-Weinstein monofilament wire test within normal limits  bilaterally. Muscle power within normal limits bilaterally.  Nails Thick disfigured discolored nails with subungual debris  from hallux to fifth toes bilaterally. No evidence of bacterial infection or drainage bilaterally.  Orthopedic  No limitations of motion  feet .  No crepitus or effusions noted.  No bony pathology or digital deformities noted.  Skin  normotropic skin with no porokeratosis noted bilaterally.  No signs of infections or ulcers noted.     Onychomycosis  Nails  B/L.  Pain in right toes  Pain in left toes  Debridement of nails both feet followed trimming the nails with dremel tool.    RTC  4  months.   Gregory Mayer DPM  

## 2023-02-17 ENCOUNTER — Ambulatory Visit: Payer: Medicare Other | Admitting: Podiatry

## 2023-03-06 DIAGNOSIS — C44311 Basal cell carcinoma of skin of nose: Secondary | ICD-10-CM | POA: Diagnosis not present

## 2023-05-08 ENCOUNTER — Encounter (HOSPITAL_COMMUNITY): Payer: Self-pay | Admitting: Psychiatry

## 2023-05-08 ENCOUNTER — Ambulatory Visit (HOSPITAL_COMMUNITY): Payer: Medicare Other | Admitting: Psychiatry

## 2023-05-08 ENCOUNTER — Other Ambulatory Visit: Payer: Self-pay

## 2023-05-08 DIAGNOSIS — F250 Schizoaffective disorder, bipolar type: Secondary | ICD-10-CM

## 2023-05-08 DIAGNOSIS — F419 Anxiety disorder, unspecified: Secondary | ICD-10-CM

## 2023-05-08 MED ORDER — SERTRALINE HCL 100 MG PO TABS: 100.0000 mg | ORAL_TABLET | Freq: Every day | ORAL | 0 refills | Status: DC

## 2023-05-08 MED ORDER — RISPERIDONE 0.5 MG PO TABS: 0.5000 mg | ORAL_TABLET | Freq: Every day | ORAL | 0 refills | Status: DC

## 2023-05-08 NOTE — Progress Notes (Signed)
BH MD/PA/NP OP Progress Note  Patient location: Office Provider location: Office  05/08/2023 1:32 PM Brandon Oliver  MRN:  [id number]  Chief Complaint:  Chief Complaint  Patient presents with   Follow-up   HPI: Patient came for his follow-up appointment.  He reported things are going well and he has no major concern.  He continues to help people around his social network.  He feels good about it.  He tried to attend Bible studies and church activities if possible.  He is taking risperidone and Zoloft.  He sleeps at least 8 hours.  He denies any anger, irritability, impulsive behavior.  Patient appears to be hypertalkative and elated at times but denies any mania, anger, impulsive behavior.  He feels very positive and good about helping other people.  Since he had sold his car he is using public transportation.  Today he decided to walk from his place for the appointment.  Patient told it was a nice walk because of weather was nice.  It is less than a mile.  Denies any irritability, crying spells, suicidal thoughts.  His energy level is good.  His appetite is okay.  His weight is stable.  Denies drinking or using any illegal substances.  He does not feel paranoid or having any trust issue.  He had good social network.    Visit Diagnosis:    ICD-10-CM   1. Schizoaffective disorder, bipolar type (HCC)  F25.0 sertraline (ZOLOFT) 100 MG tablet    risperiDONE (RISPERDAL) 0.5 MG tablet    2. Anxiety  F41.9 sertraline (ZOLOFT) 100 MG tablet       Past Psychiatric History: [facility]     Past Medical History:  Past Medical History:  Diagnosis Date   Cataracts, both eyes    none mature   History of kidney stones    x3 episodes   Hyperlipidemia    Myocardial infarction [facility])    coronary stents x2 at that time.   Retina disorder    "floaters" ? tear- Dr. Ashley Royalty follows   Schizoaffective disorder, bipolar type [facility])     sees MD every 3- 6 months- controlled.    Past Surgical History:  Procedure Laterality Date   CARDIAC CATHETERIZATION     stents x2- Dr. Mendel Ryder follows   COLONOSCOPY WITH PROPOFOL N/A 02/07/2015   Procedure: COLONOSCOPY WITH PROPOFOL;  Surgeon: Charolett Bumpers, MD;  Location: [facility];  Service: Endoscopy;  Laterality: N/A;   HEMORROIDECTOMY      Family Psychiatric History: Reviewed.  Family History:  Family History  Problem Relation Age of Onset   Cancer Mother        breast   Cancer Father        colon   Parkinson's disease Father    Rheum arthritis Father    Diabetes Maternal Aunt    Cancer Paternal Aunt        "male" cancer   Cancer Paternal Uncle        pancreatic   Cancer Paternal Grandfather        colon   Cancer Paternal Uncle        lung   Cancer Maternal Grandmother        pancreatic    Social History:  Social History   Socioeconomic History   Marital status: Single    Spouse name: Not on file   Number of children: Not on file   Years  of education: Not on file   Highest education level: Not on file  Occupational History   Not on file  Tobacco Use   Smoking status: Never   Smokeless tobacco: Never  Vaping Use   Vaping status: Never Used  Substance and Sexual Activity   Alcohol use: No    Alcohol/week: 0.0 standard drinks of alcohol   Drug use: Never   Sexual activity: Not Currently  Other Topics Concern   Not on file  Social History Narrative   Not on file   Social Determinants of Health   Financial Resource Strain: Not on file  Food Insecurity: Not on file  Transportation Needs: Not on file  Physical Activity: Not on file  Stress: Not on file  Social Connections: Not on file    Allergies:  Allergies  Allergen Reactions   Penicillins Other (See Comments)    "unsure childhood allergen"   Benadryl [Diphenhydramine Hcl] Rash    Metabolic Disorder Labs: No results found for: "HGBA1C", "MPG" No results found for:  "PROLACTIN" No results found for: "CHOL", "TRIG", "HDL", "CHOLHDL", "VLDL", "LDLCALC" No results found for: "TSH"  Therapeutic Level Labs: No results found for: "LITHIUM" No results found for: "VALPROATE" No results found for: "CBMZ"  Current Medications: Current Outpatient Medications  Medication Sig Dispense Refill   aspirin 81 MG tablet Take 81 mg by mouth daily.     carvedilol (COREG) 3.125 MG tablet Take 3.125 mg by mouth 2 (two) times daily with a meal.      Coenzyme Q10 (COQ10) 100 MG CAPS Take 200 mg by mouth daily. Pt takes 2 tablets 200 mg daily.     fenofibrate (TRICOR) 145 MG tablet Take 145 mg by mouth daily.     ferrous gluconate (FERGON) 240 (27 FE) MG tablet Take 1 tablet by mouth as needed.     Flaxseed, Linseed, (FLAX SEED OIL) 1000 MG CAPS Take 1 capsule by mouth daily.     Garlic 1000 MG CAPS Take 1 each by mouth daily.     Multiple Vitamin (MULTIVITAMIN) tablet Take 1 tablet by mouth daily.     risperiDONE (RISPERDAL) 0.5 MG tablet Take 1 tablet (0.5 mg total) by mouth daily. 90 tablet 0   rosuvastatin (CRESTOR) 20 MG tablet Take 20 mg by mouth daily.  3   sertraline (ZOLOFT) 100 MG tablet Take 1 tablet (100 mg total) by mouth daily. 90 tablet 0   Omega-3 Fatty Acids (FISH OIL) 1000 MG CAPS Take 1,000 mg by mouth as needed.     No current facility-administered medications for this visit.     Musculoskeletal: Strength & Muscle Tone: within normal limits Gait & Station: normal Patient leans: N/A  Psychiatric Specialty Exam: Physical Exam  Review of Systems  Blood pressure 130/71, pulse 83, height 5\' 9"  (1.753 m), weight 193 lb (87.5 kg).Body mass index is 28.5 kg/m.  General Appearance: Casual  Eye Contact:  Good  Speech:   fast  Volume:  Increased  Mood:   somewhat elated  Affect:  Full Range  Thought Process:  Descriptions of Associations: Intact  Orientation:  Full (Time, Place, and Person)  Thought Content:  Logical  Suicidal Thoughts:  No   Homicidal Thoughts:  No  Memory:  Immediate;   Good Recent;   Good Remote;   Fair  Judgement:  Intact  Insight:  Present  Psychomotor Activity:  Increased  Concentration:  Concentration: Fair and Attention Span: Fair  Recall:  Good  Fund of Knowledge:  Good  Language:  Good  Akathisia:  No  Handed:  Right  AIMS (if indicated):     Assets:  Communication Skills Desire for Improvement Housing Resilience Social Support Transportation  ADL's:  Intact  Cognition:  WNL  Sleep:   8 hrs     Screenings: Flowsheet Row Video Visit [facility]/06[facility]2021 [facility] Video Visit from 02/02/2021 in [facility] Video Visit from 11/02/2020 in [facility]  C-SSRS RISK CATEGORY No Risk No Risk No Risk        Assessment and Plan: Patient is stable on current medication.  He enjoys helping people and feels good about it.  He does not want to change the medication since it is working well.  He is taking risperidone 0.5 mg for past 1 year and he used to take higher dose but slowly and gradually it has been cut down.  He admitted coming close to the religion has been very helpful.  He like to keep his current medication.  Continue risperidone 0.5 mg at bedtime and Zoloft 100 mg daily.  Like to have a follow-up in 6 months.  I encouraged him to call us back if he feel he needs a sooner appointment.   Collaboration of Care: Collaboration of Care: Other provider involved in patient's care AEB notes are available in epic to review  Patient/Guardian was advised Release of Information must be obtained prior to any record release in order to collaborate their care with an outside provider. Patient/Guardian was advised if they have not already done so to contact the registration department to sign all necessary forms in order for [place] to release information regarding their care.   Consent:  Patient/Guardian gives verbal consent for treatment and assignment of benefits for services provided during this visit. Patient/Guardian expressed understanding and agreed to proceed.   I provided 22 minutes of face-to-face time to the patient during this encounter.    Cleotis Nipper, MD 05/08/2023, 1:33 PM

## 2023-06-16 ENCOUNTER — Ambulatory Visit: Payer: Medicare Other | Admitting: Podiatry

## 2023-07-01 ENCOUNTER — Ambulatory Visit: Payer: Medicare Other | Admitting: Podiatry

## 2023-08-11 DIAGNOSIS — R0981 Nasal congestion: Secondary | ICD-10-CM | POA: Diagnosis not present

## 2023-08-11 DIAGNOSIS — R051 Acute cough: Secondary | ICD-10-CM | POA: Diagnosis not present

## 2023-08-11 DIAGNOSIS — J209 Acute bronchitis, unspecified: Secondary | ICD-10-CM | POA: Diagnosis not present

## 2023-08-11 DIAGNOSIS — K5909 Other constipation: Secondary | ICD-10-CM | POA: Diagnosis not present

## 2023-08-11 DIAGNOSIS — Z03818 Encounter for observation for suspected exposure to other biological agents ruled out: Secondary | ICD-10-CM | POA: Diagnosis not present

## 2023-08-21 ENCOUNTER — Other Ambulatory Visit (HOSPITAL_COMMUNITY): Payer: Self-pay | Admitting: Psychiatry

## 2023-08-21 DIAGNOSIS — F250 Schizoaffective disorder, bipolar type: Secondary | ICD-10-CM

## 2023-08-21 DIAGNOSIS — F419 Anxiety disorder, unspecified: Secondary | ICD-10-CM

## 2023-08-23 DIAGNOSIS — J40 Bronchitis, not specified as acute or chronic: Secondary | ICD-10-CM | POA: Diagnosis not present

## 2023-08-23 DIAGNOSIS — R0982 Postnasal drip: Secondary | ICD-10-CM | POA: Diagnosis not present

## 2023-09-17 ENCOUNTER — Ambulatory Visit: Payer: Medicare Other | Admitting: Podiatry

## 2023-10-01 ENCOUNTER — Ambulatory Visit: Payer: Medicare Other | Admitting: Podiatry

## 2023-10-03 ENCOUNTER — Ambulatory Visit: Admitting: Podiatry

## 2023-10-03 ENCOUNTER — Encounter: Payer: Self-pay | Admitting: Podiatry

## 2023-10-03 ENCOUNTER — Encounter: Payer: Self-pay | Admitting: Cardiology

## 2023-10-03 ENCOUNTER — Other Ambulatory Visit: Payer: Self-pay | Admitting: Cardiology

## 2023-10-03 ENCOUNTER — Ambulatory Visit: Payer: Medicare Other | Attending: Cardiology | Admitting: Cardiology

## 2023-10-03 VITALS — BP 122/70 | HR 78 | Resp 16 | Ht 69.0 in | Wt 188.0 lb

## 2023-10-03 DIAGNOSIS — R0989 Other specified symptoms and signs involving the circulatory and respiratory systems: Secondary | ICD-10-CM | POA: Diagnosis not present

## 2023-10-03 DIAGNOSIS — I25708 Atherosclerosis of coronary artery bypass graft(s), unspecified, with other forms of angina pectoris: Secondary | ICD-10-CM

## 2023-10-03 DIAGNOSIS — E782 Mixed hyperlipidemia: Secondary | ICD-10-CM

## 2023-10-03 DIAGNOSIS — B351 Tinea unguium: Secondary | ICD-10-CM

## 2023-10-03 DIAGNOSIS — M79676 Pain in unspecified toe(s): Secondary | ICD-10-CM

## 2023-10-03 NOTE — Progress Notes (Signed)
This patient presents to the office with chief complaint of long thick painful nails.  Patient says the nails are painful walking and wearing shoes.  This patient is unable to self treat.  This patient is unable to trim his nails since she is unable to reach his nails.  he presents to the office for preventative foot care services.  General Appearance  Alert, conversant and in no acute stress.  Vascular  Dorsalis pedis and posterior tibial  pulses are palpable  bilaterally.  Capillary return is within normal limits  bilaterally. Temperature is within normal limits  bilaterally.  Neurologic  Senn-Weinstein monofilament wire test within normal limits  bilaterally. Muscle power within normal limits bilaterally.  Nails Thick disfigured discolored nails with subungual debris  from hallux to fifth toes bilaterally. No evidence of bacterial infection or drainage bilaterally.  Orthopedic  No limitations of motion  feet .  No crepitus or effusions noted.  No bony pathology or digital deformities noted.  Skin  normotropic skin with no porokeratosis noted bilaterally.  No signs of infections or ulcers noted.     Onychomycosis  Nails  B/L.  Pain in right toes  Pain in left toes  Debridement of nails both feet followed trimming the nails with dremel tool.    RTC  4  months.   Gregory Mayer DPM  

## 2023-10-03 NOTE — Progress Notes (Signed)
 Cardiology Office Note:  .   Date:  10/05/2023  ID:  Brandon Oliver, DOB May 04, 1946, MRN [id number] PCP: Yates Decamp, MD   [facility] Providers Cardiologist:  Lesleigh Noe, MD (Inactive)   History of Present Illness: Brandon Oliver Kitchen   Brandon Oliver is a 78 y.o. male with PMH of CAD s/p MI 1998 with BMS x 2 to circumflex, HLD, schizoaffective disorder, interstitial lung disease who presents today for 1 year follow-up of coronary artery disease.   Discussed the use of AI scribe software for clinical note transcription with the patient, who gave verbal consent to proceed.  History of Present Illness   Brandon Oliver, a patient with a history of cardiovascular disease, presents for a routine check-up. He reports no current symptoms and is physically active, walking frequently for transportation and exercise. He has a history of mental health issues, which have improved significantly. He mentions a family history of stroke and heart disease, and his sister has suggested he get his carotid arteries checked.      Labs   External Labs:  Care Everywhere [facility] labs 10/29/2022:  Total cholesterol 131, triglycerides 181, HDL 43, LDL 58.  Review of Systems  Cardiovascular:  Positive for dyspnea on exertion (stable). Negative for chest pain and leg swelling.   Physical Exam:   VS:  BP 122/70 (BP Location: Left Arm, Patient Position: Sitting, Cuff Size: Normal)   Pulse 78   Resp 16   Ht 5\' 9"  (1.753 m)   Wt 188 lb (85.3 kg)   SpO2 98%   BMI 27.76 kg/m    Wt Readings from Last 3 Encounters:  10/03/23 188 lb (85.3 kg)  11/22/22 189 lb (85.7 kg)  09/20/22 193 lb (87.5 kg)    Physical Exam Neck:     Vascular: No carotid bruit or JVD.  Cardiovascular:     Rate and Rhythm: Normal rate and regular rhythm.     Pulses: Intact distal pulses.     Heart sounds: Normal heart sounds. No murmur heard.    No gallop.  Pulmonary:     Effort: Pulmonary effort is normal.     Breath sounds: Examination  of the right-lower field reveals rales. Examination of the left-lower field reveals rales. Rales present.  Abdominal:     General: Bowel sounds are normal.     Palpations: Abdomen is soft.  Musculoskeletal:     Right lower leg: No edema.     Left lower leg: No edema.    Studies Reviewed: .    CT Chest 11/12/2022: 1. Spectrum of findings compatible with basilar predominant fibrotic interstitial lung disease without frank honeycombing. No convincing interval progression since 08/10/2021 CT.  EKG:    EKG Interpretation Date/Time:  Friday October 03 2023 13:45:56 EST Ventricular Rate:  82 PR Interval:  162 QRS Duration:  96 QT Interval:  372 QTC Calculation: 434 R Axis:   -19  Text Interpretation: EKG 10/03/2023: Normal sinus rhythm at rate of 82 bpm, low voltage complexes otherwise normal EKG.   Compared to 02/22/2016, low-voltage new. Confirmed by Delrae Rend [phone]) on 10/03/2023 1:54:22 PM    Medications and allergies    Allergies  Allergen Reactions   Penicillins Other (See Comments)    "unsure childhood allergen"   Benadryl [Diphenhydramine Hcl] Rash     Current Outpatient Medications:    aspirin 81 MG tablet, Take 81 mg by mouth daily., Disp: , Rfl:    carvedilol (COREG) 3.125 MG tablet, Take 3.125  mg by mouth 2 (two) times daily with a meal. , Disp: , Rfl:    Coenzyme Q10 (COQ10) 100 MG CAPS, Take 200 mg by mouth daily. Pt takes 2 tablets 200 mg daily., Disp: , Rfl:    fenofibrate (TRICOR) 145 MG tablet, Take 145 mg by mouth daily., Disp: , Rfl:    ferrous gluconate (FERGON) 240 (27 FE) MG tablet, Take 1 tablet by mouth as needed., Disp: , Rfl:    Flaxseed, Linseed, (FLAX SEED OIL) 1000 MG CAPS, Take 1 capsule by mouth daily., Disp: , Rfl:    Garlic 1000 MG CAPS, Take 1 each by mouth daily., Disp: , Rfl:    Multiple Vitamin (MULTIVITAMIN) tablet, Take 1 tablet by mouth daily., Disp: , Rfl:    Omega-3 Fatty Acids (FISH OIL) 1000 MG CAPS, Take 1,000 mg by mouth as  needed., Disp: , Rfl:    risperiDONE (RISPERDAL) 0.5 MG tablet, Take 1 tablet (0.5 mg total) by mouth daily., Disp: 90 tablet, Rfl: 0   rosuvastatin (CRESTOR) 20 MG tablet, Take 20 mg by mouth daily., Disp: , Rfl: 3   sertraline (ZOLOFT) 100 MG tablet, Take 1 tablet (100 mg total) by mouth daily., Disp: 90 tablet, Rfl: 0   ASSESSMENT AND PLAN: .      ICD-10-CM   1. Coronary artery disease of bypass graft of native heart with stable angina pectoris (HCC)  I25.708 EKG 12-Lead    CANCELED: US Carotid Duplex Bilateral    CANCELED: VAS US CAROTID    2. Mixed hyperlipidemia  E78.2 CANCELED: US Carotid Duplex Bilateral    CANCELED: VAS US CAROTID    3. Right carotid bruit  R09.89 CANCELED: US Carotid Duplex Bilateral    CANCELED: VAS US CAROTID      1. Coronary artery disease of bypass graft of native heart with stable angina pectoris [facility]) Patient with known coronary disease and CABG in the remote past, remains asymptomatic with occasional episodes of chest pain but has not been lifestyle limiting.  He is tolerating carvedilol and Crestor along with aspirin 81 mg daily.  Continue the same.  He has not had any recent stress testing or echocardiogram.  His last stress test was in 2017 which was nonischemic revealing old infarction.  2. Mixed hyperlipidemia Lipids are well-controlled, reviewed [facility] labs.  Presently on Crestor 20 mg daily, continue the same.  Carotid bruit, carotid duplex will be ordered to follow-up on the bruit.  Overall patient remained stable from cardiac standpoint without any recurrence of angina and no clinical evidence of heart failure, EKG except for low voltage complexes within normal limits without ischemia as well.  Will see him back in a year or sooner if problems.      Signed,  Yates Decamp, MD, [facility] 10/05/2023, 4:17 PM [facility] [address] [place], [place] [address] Phone: [phone]. Fax:  [phone]

## 2023-10-03 NOTE — Patient Instructions (Signed)
 Medication Instructions:  Your physician recommends that you continue on your current medications as directed. Please refer to the Current Medication list given to you today. *If you need a refill on your cardiac medications before your next appointment, please call your pharmacy*   Testing/Procedures: Carotid Duplex Ultrasound Your physician has requested that you have a carotid duplex. This test is an ultrasound of the carotid arteries in your neck. It looks at blood flow through these arteries that supply the brain with blood. Allow one hour for this exam. There are no restrictions or special instructions.   Follow-Up: At [facility], you and your health needs are our priority.  As part of our continuing mission to provide you with exceptional heart care, we have created designated Provider Care Teams.  These Care Teams include your primary Cardiologist (physician) and Advanced Practice Providers (APPs -  Physician Assistants and Nurse Practitioners) who all work together to provide you with the care you need, when you need it.  We recommend signing up for the patient portal called "MyChart".  Sign up information is provided on this After Visit Summary.  MyChart is used to connect with patients for Virtual Visits (Telemedicine).  Patients are able to view lab/test results, encounter notes, upcoming appointments, etc.  Non-urgent messages can be sent to your provider as well.   To learn more about what you can do with MyChart, go to [email or web address].    Your next appointment:   1 year(s)  Provider:   Dr Jacinto Halim

## 2023-10-14 ENCOUNTER — Ambulatory Visit (HOSPITAL_COMMUNITY)
Admission: RE | Admit: 2023-10-14 | Discharge: 2023-10-14 | Disposition: A | Discharge status: Home / Self Care | Source: Ambulatory Visit | Attending: Cardiology | Admitting: Cardiology

## 2023-10-14 DIAGNOSIS — R0989 Other specified symptoms and signs involving the circulatory and respiratory systems: Secondary | ICD-10-CM | POA: Diagnosis not present

## 2023-10-15 NOTE — Progress Notes (Signed)
 Please let patient know that he has very minimal plaque in his carotid arteries, no change in therapy indicated at this time.  He is on appropriate medical therapy.

## 2023-10-20 ENCOUNTER — Other Ambulatory Visit (HOSPITAL_COMMUNITY): Payer: Self-pay | Admitting: *Deleted

## 2023-10-20 ENCOUNTER — Other Ambulatory Visit (HOSPITAL_COMMUNITY): Payer: Self-pay | Admitting: Psychiatry

## 2023-10-20 DIAGNOSIS — F419 Anxiety disorder, unspecified: Secondary | ICD-10-CM

## 2023-10-20 DIAGNOSIS — F250 Schizoaffective disorder, bipolar type: Secondary | ICD-10-CM

## 2023-10-20 MED ORDER — SERTRALINE HCL 100 MG PO TABS: 100.0000 mg | ORAL_TABLET | Freq: Every day | ORAL | 0 refills | Status: DC

## 2023-11-05 DIAGNOSIS — E782 Mixed hyperlipidemia: Secondary | ICD-10-CM | POA: Diagnosis not present

## 2023-11-05 DIAGNOSIS — Z79899 Other long term (current) drug therapy: Secondary | ICD-10-CM | POA: Diagnosis not present

## 2023-11-06 ENCOUNTER — Ambulatory Visit (HOSPITAL_COMMUNITY): Payer: Medicare Other | Admitting: Psychiatry

## 2023-11-06 ENCOUNTER — Encounter (HOSPITAL_COMMUNITY): Payer: Self-pay | Admitting: Psychiatry

## 2023-11-06 ENCOUNTER — Other Ambulatory Visit: Payer: Self-pay

## 2023-11-06 DIAGNOSIS — F250 Schizoaffective disorder, bipolar type: Secondary | ICD-10-CM

## 2023-11-06 DIAGNOSIS — F419 Anxiety disorder, unspecified: Secondary | ICD-10-CM

## 2023-11-06 MED ORDER — SERTRALINE HCL 100 MG PO TABS: 100.0000 mg | ORAL_TABLET | Freq: Every day | ORAL | 1 refills | Status: DC

## 2023-11-06 MED ORDER — RISPERIDONE 0.5 MG PO TABS: 0.5000 mg | ORAL_TABLET | Freq: Every day | ORAL | 1 refills | Status: DC

## 2023-11-06 NOTE — Progress Notes (Signed)
 BH MD/PA/NP OP Progress Note  Patient location: Office Provider location: Office  11/06/2023 11:22 AM Brandon Oliver  MRN:  [id number]  Chief Complaint:  Chief Complaint  Patient presents with   Follow-up   Medication Refill   HPI: Patient came today for his appointment.  He walk from his place which is 1 mile to his appointment.  He enjoys walking and feel very active.  During the session he appears somewhat elated and euphoric with pressured speech but able to redirect.  He denies any agitation, mania, psychosis.  He sleeps good.  He is taking risperidone and Zoloft.  Recently he had a visit with cardiologist and he was very happy and pleased that he has minimal risk of carotid plaques.  He also had a blood work recently and labs are stable.  His hemoglobin A1c is 6 which is actually dropped from 6.1.  He is appointment to see his primary care Dr. Tenny Craw in coming days.  He lost few pounds since the last visit.  Since he sold his car he is dependent on public transport but he has good social network and friends who takes him to [employer] services and Bible studies.  He really enjoyed helping people and some time his sister want him that someone will take the advantage from him.  However patient does not feel that way and he believes helping people is from the guard.  He denies any hallucination or paranoia.  He has no tremors, shakes or any EPS.  He had good social network and friends.  He denies drinking or using any illegal substances.  Visit Diagnosis:    ICD-10-CM   1. Schizoaffective disorder, bipolar type (HCC)  F25.0 risperiDONE (RISPERDAL) 0.5 MG tablet    sertraline (ZOLOFT) 100 MG tablet    2. Anxiety  F41.9 sertraline (ZOLOFT) 100 MG tablet        Past Psychiatric History: [facility]     Past Medical History:  Past Medical History:  Diagnosis Date   Cataracts, both eyes    none mature   History of  kidney stones    x3 episodes   Hyperlipidemia    Myocardial infarction [facility])    coronary stents x2 at that time.   Retina disorder    "floaters" ? tear- Dr. Ashley Royalty follows   Schizoaffective disorder, bipolar type [facility])    sees MD every 3- 6 months- controlled.    Past Surgical History:  Procedure Laterality Date   CARDIAC CATHETERIZATION     stents x2- Dr. Mendel Ryder follows   COLONOSCOPY WITH PROPOFOL N/A 02/07/2015   Procedure: COLONOSCOPY WITH PROPOFOL;  Surgeon: Charolett Bumpers, MD;  Location: [facility];  Service: Endoscopy;  Laterality: N/A;   HEMORROIDECTOMY      Family Psychiatric History: Reviewed.  Family History:  Family History  Problem Relation Age of Onset   Cancer Mother        breast   Cancer Father        colon   Parkinson's disease Father    Rheum arthritis Father    Diabetes Maternal Aunt    Cancer Paternal Aunt        "male" cancer   Cancer Paternal Uncle        pancreatic   Cancer Paternal Grandfather        colon   Cancer Paternal Uncle        lung   Cancer  Maternal Grandmother        pancreatic    Social History:  Social History   Socioeconomic History   Marital status: Single    Spouse name: Not on file   Number of children: Not on file   Years of education: Not on file   Highest education level: Not on file  Occupational History   Not on file  Tobacco Use   Smoking status: Never   Smokeless tobacco: Never  Vaping Use   Vaping status: Never Used  Substance and Sexual Activity   Alcohol use: No    Alcohol/week: 0.0 standard drinks of alcohol   Drug use: Never   Sexual activity: Not Currently  Other Topics Concern   Not on file  Social History Narrative   Not on file   Social Drivers of Health   Financial Resource Strain: Not on file  Food Insecurity: Not on file  Transportation Needs: Not on file  Physical Activity: Not on file  Stress: Not on file  Social Connections: Not on file    Allergies:  Allergies   Allergen Reactions   Penicillins Other (See Comments)    "unsure childhood allergen"   Benadryl [Diphenhydramine Hcl] Rash    Metabolic Disorder Labs: No results found for: "HGBA1C", "MPG" No results found for: "PROLACTIN" No results found for: "CHOL", "TRIG", "HDL", "CHOLHDL", "VLDL", "LDLCALC" No results found for: "TSH"  Therapeutic Level Labs: No results found for: "LITHIUM" No results found for: "VALPROATE" No results found for: "CBMZ"  Current Medications: Current Outpatient Medications  Medication Sig Dispense Refill   aspirin 81 MG tablet Take 81 mg by mouth daily.     carvedilol (COREG) 3.125 MG tablet Take 3.125 mg by mouth 2 (two) times daily with a meal.      Coenzyme Q10 (COQ10) 100 MG CAPS Take 200 mg by mouth daily. Pt takes 2 tablets 200 mg daily.     fenofibrate (TRICOR) 145 MG tablet Take 145 mg by mouth daily.     Flaxseed, Linseed, (FLAX SEED OIL) 1000 MG CAPS Take 1 capsule by mouth daily.     Garlic 1000 MG CAPS Take 1 each by mouth daily.     Multiple Vitamin (MULTIVITAMIN) tablet Take 1 tablet by mouth daily.     Omega-3 Fatty Acids (FISH OIL) 1000 MG CAPS Take 1,000 mg by mouth as needed.     risperiDONE (RISPERDAL) 0.5 MG tablet Take 1 tablet (0.5 mg total) by mouth daily. 90 tablet 0   rosuvastatin (CRESTOR) 20 MG tablet Take 20 mg by mouth daily.  3   sertraline (ZOLOFT) 100 MG tablet Take 1 tablet (100 mg total) by mouth daily. 20 tablet 0   ferrous gluconate (FERGON) 240 (27 FE) MG tablet Take 1 tablet by mouth as needed. (Patient not taking: Reported on 11/06/2023)     No current facility-administered medications for this visit.     Musculoskeletal: Strength & Muscle Tone: within normal limits Gait & Station: normal Patient leans: N/A. Psychiatric Specialty Exam: Physical Exam  Review of Systems  Blood pressure (!) 144/76, pulse 77, height 5' 8.75" (1.746 m), weight 185 lb 9.6 oz (84.2 kg).Body mass index is 27.61 kg/m.  General  Appearance: Casual  Eye Contact:  Good  Speech:   fast and pressured at times  Volume:  Increased  Mood:  Euphoric  Affect:  Full Range  Thought Process:  Descriptions of Associations: Intact  Orientation:  Full (Time, Place, and Person)  Thought Content:  WDL  Suicidal Thoughts:  No  Homicidal Thoughts:  No  Memory:  Immediate;   Good Recent;   Good Remote;   Good  Judgement:  Intact  Insight:  Present  Psychomotor Activity:  Decreased  Concentration:  Concentration: Fair and Attention Span: Fair  Recall:  Good  Fund of Knowledge:  Good  Language:  Good  Akathisia:  No  Handed:  Right  AIMS (if indicated):     Assets:  Communication Skills Desire for Improvement Financial Resources/Insurance Housing Leisure Time Physical Health Resilience Social Support  ADL's:  Intact  Cognition:  WNL  Sleep:   good      Screenings: Flowsheet Row [facility] Video Visit from 02/02/2021 in [facility] Video Visit from 11[facility]02/2020 in [facility]  C-SSRS RISK CATEGORY No Risk No Risk No Risk        Assessment and Plan: Patient is stable on his current medication.  He has some related mood and euphoria but denies any mania or any psychosis.  His sleep is good.  He is taking his medication every day.  I reviewed blood work results which was done recently.  Hemoglobin A1c 6.0 which is actually better from 6.1.  Other labs are stable.  Review records from other providers.  Patient used to take higher dose of risperidone however past 18 months we have cut down the dose and is only taking 0.5 mg.  Discussed to keep the current medication however if he notices worsening of symptoms then he can call us back.  We also agree to keep the appointment in 6 months unless needed earlier.  Continue Zoloft 100 mg daily and Risperdal 0.5 mg at  bedtime.  Collaboration of Care: Collaboration of Care: Other provider involved in patient's care AEB notes are available in epic to review  Patient/Guardian was advised Release of Information must be obtained prior to any record release in order to collaborate their care with an outside provider. Patient/Guardian was advised if they have not already done so to contact the registration department to sign all necessary forms in order for [place] to release information regarding their care.   Consent: Patient/Guardian gives verbal consent for treatment and assignment of benefits for services provided during this visit. Patient/Guardian expressed understanding and agreed to proceed.   Total encounter time 25 minutes which includes face-to-face time, chart review, care coordination, order entry and documentation.     Cleotis Nipper, MD 11/06/2023, 11:23 AM

## 2023-11-12 DIAGNOSIS — Z0000 Encounter for general adult medical examination without abnormal findings: Secondary | ICD-10-CM | POA: Diagnosis not present

## 2023-11-12 DIAGNOSIS — I252 Old myocardial infarction: Secondary | ICD-10-CM | POA: Diagnosis not present

## 2023-11-12 DIAGNOSIS — E782 Mixed hyperlipidemia: Secondary | ICD-10-CM | POA: Diagnosis not present

## 2023-11-12 DIAGNOSIS — K760 Fatty (change of) liver, not elsewhere classified: Secondary | ICD-10-CM | POA: Diagnosis not present

## 2023-11-12 DIAGNOSIS — B356 Tinea cruris: Secondary | ICD-10-CM | POA: Diagnosis not present

## 2023-11-12 DIAGNOSIS — Z79899 Other long term (current) drug therapy: Secondary | ICD-10-CM | POA: Diagnosis not present

## 2023-12-17 ENCOUNTER — Encounter (INDEPENDENT_AMBULATORY_CARE_PROVIDER_SITE_OTHER): Payer: Medicare Other | Admitting: Ophthalmology

## 2023-12-17 DIAGNOSIS — D3132 Benign neoplasm of left choroid: Secondary | ICD-10-CM

## 2023-12-17 DIAGNOSIS — H33301 Unspecified retinal break, right eye: Secondary | ICD-10-CM

## 2023-12-17 DIAGNOSIS — H43813 Vitreous degeneration, bilateral: Secondary | ICD-10-CM

## 2024-01-05 ENCOUNTER — Ambulatory Visit: Admitting: Podiatry

## 2024-01-05 ENCOUNTER — Encounter: Payer: Self-pay | Admitting: Podiatry

## 2024-01-05 DIAGNOSIS — B351 Tinea unguium: Secondary | ICD-10-CM | POA: Diagnosis not present

## 2024-01-05 DIAGNOSIS — M79676 Pain in unspecified toe(s): Secondary | ICD-10-CM | POA: Diagnosis not present

## 2024-01-05 NOTE — Progress Notes (Signed)
This patient presents to the office with chief complaint of long thick painful nails.  Patient says the nails are painful walking and wearing shoes.  This patient is unable to self treat.  This patient is unable to trim his nails since she is unable to reach his nails.  he presents to the office for preventative foot care services.  General Appearance  Alert, conversant and in no acute stress.  Vascular  Dorsalis pedis and posterior tibial  pulses are palpable  bilaterally.  Capillary return is within normal limits  bilaterally. Temperature is within normal limits  bilaterally.  Neurologic  Senn-Weinstein monofilament wire test within normal limits  bilaterally. Muscle power within normal limits bilaterally.  Nails Thick disfigured discolored nails with subungual debris  from hallux to fifth toes bilaterally. No evidence of bacterial infection or drainage bilaterally.  Orthopedic  No limitations of motion  feet .  No crepitus or effusions noted.  No bony pathology or digital deformities noted.  Skin  normotropic skin with no porokeratosis noted bilaterally.  No signs of infections or ulcers noted.     Onychomycosis  Nails  B/L.  Pain in right toes  Pain in left toes  Debridement of nails both feet followed trimming the nails with dremel tool.    RTC  4  months.   Gregory Mayer DPM  

## 2024-01-14 DIAGNOSIS — Z860101 Personal history of adenomatous and serrated colon polyps: Secondary | ICD-10-CM | POA: Diagnosis not present

## 2024-01-14 DIAGNOSIS — Z800 Family history of malignant neoplasm of digestive organs: Secondary | ICD-10-CM | POA: Diagnosis not present

## 2024-01-14 DIAGNOSIS — K219 Gastro-esophageal reflux disease without esophagitis: Secondary | ICD-10-CM | POA: Diagnosis not present

## 2024-02-04 ENCOUNTER — Other Ambulatory Visit (HOSPITAL_COMMUNITY): Payer: Self-pay | Admitting: Psychiatry

## 2024-02-04 DIAGNOSIS — F419 Anxiety disorder, unspecified: Secondary | ICD-10-CM

## 2024-02-04 DIAGNOSIS — F250 Schizoaffective disorder, bipolar type: Secondary | ICD-10-CM

## 2024-03-03 DIAGNOSIS — Z800 Family history of malignant neoplasm of digestive organs: Secondary | ICD-10-CM | POA: Diagnosis not present

## 2024-03-03 DIAGNOSIS — D123 Benign neoplasm of transverse colon: Secondary | ICD-10-CM | POA: Diagnosis not present

## 2024-03-03 DIAGNOSIS — Z860101 Personal history of adenomatous and serrated colon polyps: Secondary | ICD-10-CM | POA: Diagnosis not present

## 2024-03-03 DIAGNOSIS — Z09 Encounter for follow-up examination after completed treatment for conditions other than malignant neoplasm: Secondary | ICD-10-CM | POA: Diagnosis not present

## 2024-03-22 DIAGNOSIS — Z85828 Personal history of other malignant neoplasm of skin: Secondary | ICD-10-CM | POA: Diagnosis not present

## 2024-03-22 DIAGNOSIS — L821 Other seborrheic keratosis: Secondary | ICD-10-CM | POA: Diagnosis not present

## 2024-03-22 DIAGNOSIS — L720 Epidermal cyst: Secondary | ICD-10-CM | POA: Diagnosis not present

## 2024-03-22 DIAGNOSIS — L814 Other melanin hyperpigmentation: Secondary | ICD-10-CM | POA: Diagnosis not present

## 2024-03-22 DIAGNOSIS — I788 Other diseases of capillaries: Secondary | ICD-10-CM | POA: Diagnosis not present

## 2024-03-22 DIAGNOSIS — L918 Other hypertrophic disorders of the skin: Secondary | ICD-10-CM | POA: Diagnosis not present

## 2024-03-22 DIAGNOSIS — L218 Other seborrheic dermatitis: Secondary | ICD-10-CM | POA: Diagnosis not present

## 2024-03-22 DIAGNOSIS — D1801 Hemangioma of skin and subcutaneous tissue: Secondary | ICD-10-CM | POA: Diagnosis not present

## 2024-03-22 DIAGNOSIS — D225 Melanocytic nevi of trunk: Secondary | ICD-10-CM | POA: Diagnosis not present

## 2024-05-04 ENCOUNTER — Ambulatory Visit: Admitting: Podiatry

## 2024-05-04 ENCOUNTER — Encounter: Payer: Self-pay | Admitting: Podiatry

## 2024-05-04 DIAGNOSIS — B351 Tinea unguium: Secondary | ICD-10-CM | POA: Diagnosis not present

## 2024-05-04 DIAGNOSIS — M79676 Pain in unspecified toe(s): Secondary | ICD-10-CM | POA: Diagnosis not present

## 2024-05-04 NOTE — Progress Notes (Signed)
This patient presents to the office with chief complaint of long thick painful nails.  Patient says the nails are painful walking and wearing shoes.  This patient is unable to self treat.  This patient is unable to trim his nails since she is unable to reach his nails.  he presents to the office for preventative foot care services.  General Appearance  Alert, conversant and in no acute stress.  Vascular  Dorsalis pedis and posterior tibial  pulses are palpable  bilaterally.  Capillary return is within normal limits  bilaterally. Temperature is within normal limits  bilaterally.  Neurologic  Senn-Weinstein monofilament wire test within normal limits  bilaterally. Muscle power within normal limits bilaterally.  Nails Thick disfigured discolored nails with subungual debris  from hallux to fifth toes bilaterally. No evidence of bacterial infection or drainage bilaterally.  Orthopedic  No limitations of motion  feet .  No crepitus or effusions noted.  No bony pathology or digital deformities noted.  Skin  normotropic skin with no porokeratosis noted bilaterally.  No signs of infections or ulcers noted.     Onychomycosis  Nails  B/L.  Pain in right toes  Pain in left toes  Debridement of nails both feet followed trimming the nails with dremel tool.    RTC  4  months.   Gregory Mayer DPM  

## 2024-05-06 ENCOUNTER — Other Ambulatory Visit: Payer: Self-pay

## 2024-05-06 ENCOUNTER — Ambulatory Visit (HOSPITAL_COMMUNITY): Admitting: Psychiatry

## 2024-05-06 ENCOUNTER — Encounter (HOSPITAL_COMMUNITY): Payer: Self-pay | Admitting: Psychiatry

## 2024-05-06 VITALS — BP 119/68 | HR 70 | Ht 68.0 in | Wt 185.0 lb

## 2024-05-06 DIAGNOSIS — F419 Anxiety disorder, unspecified: Secondary | ICD-10-CM | POA: Diagnosis not present

## 2024-05-06 DIAGNOSIS — F250 Schizoaffective disorder, bipolar type: Secondary | ICD-10-CM | POA: Diagnosis not present

## 2024-05-06 MED ORDER — RISPERIDONE 0.5 MG PO TABS
0.5000 mg | ORAL_TABLET | Freq: Every day | ORAL | 1 refills | Status: AC
Start: 2024-05-06 — End: ?

## 2024-05-06 MED ORDER — SERTRALINE HCL 100 MG PO TABS: 100.0000 mg | ORAL_TABLET | Freq: Every day | ORAL | 1 refills | Status: AC

## 2024-05-06 NOTE — Progress Notes (Signed)
 BH MD/PA/NP OP Progress Note  Patient location: Office Provider location: Office  05/06/2024 10:19 AM Brandon Oliver  MRN:  [id number]  Chief Complaint:  Chief Complaint  Patient presents with   Follow-up   Medication Refill   HPI: Patient came to the office for his follow-up appointment.  He reported things are going very well and he tried to keep himself busy.  He enjoys helping other people.  He walks from his place to his appointment with his almost a mile.  He denies any paranoia, hallucination, agitation.  He sleeps at least 7 to 8 hours.  He reported some concern about his sister who recently had enlarged lymph and had a biopsy and going to see hematology.  He does not want to change the medication.  Today he is happy as blood pressure is much better.  His appetite is okay.  He denies any panic attack, crying spells or any feeling of hopelessness or worthlessness.  He has a good support from his friend and he had a good social network.  He denies drinking or using any illegal substances.  Like to keep his risperidone  and Zoloft .  Visit Diagnosis:    ICD-10-CM   1. Schizoaffective disorder, bipolar type (HCC)  F25.0 risperiDONE  (RISPERDAL ) 0.5 MG tablet    sertraline  (ZOLOFT ) 100 MG tablet    2. Anxiety  F41.9 sertraline  (ZOLOFT ) 100 MG tablet       Past Psychiatric History: [facility] 2005 [facility]     Past Medical History:  Past Medical History:  Diagnosis Date   Cataracts, both eyes    none mature   History of kidney stones    x3 episodes   Hyperlipidemia    Myocardial infarction [facility])    coronary stents x2 at that time.   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 Alvia follows   Schizoaffective disorder, bipolar type [facility])    sees MD every 3- 6 months- controlled.    Past Surgical History:  Procedure Laterality Date   CARDIAC CATHETERIZATION     stents x2- Dr. HILARIO Sharps follows   COLONOSCOPY WITH  PROPOFOL  N/A 02/07/2015   Procedure: COLONOSCOPY WITH PROPOFOL ;  Surgeon: Gladis MARLA Louder, MD;  Location: [facility];  Service: Endoscopy;  Laterality: N/A;   HEMORROIDECTOMY      Family Psychiatric History: Reviewed.  Family History:  Family History  Problem Relation Age of Onset   Cancer Mother        breast   Cancer Father        colon   Parkinson's disease Father    Rheum arthritis Father    Diabetes Maternal Aunt    Cancer Paternal Aunt        male cancer   Cancer Paternal Uncle        pancreatic   Cancer Paternal Grandfather        colon   Cancer Paternal Uncle        lung   Cancer Maternal Grandmother        pancreatic    Social History:  Social History   Socioeconomic History   Marital status: Single    Spouse name: Not on file   Number of children: Not on file   Years of education: Not on file   Highest education level: Not on file  Occupational History   Not on file  Tobacco Use   Smoking status: Never   Smokeless tobacco:  Never  Vaping Use   Vaping status: Never Used  Substance and Sexual Activity   Alcohol use: No    Alcohol/week: 0.0 standard drinks of alcohol   Drug use: Never   Sexual activity: Not Currently  Other Topics Concern   Not on file  Social History Narrative   Not on file   Social Drivers of Health   Financial Resource Strain: Not on file  Food Insecurity: Not on file  Transportation Needs: Not on file  Physical Activity: Not on file  Stress: Not on file  Social Connections: Not on file    Allergies:  Allergies  Allergen Reactions   Penicillins Other (See Comments)    unsure childhood allergen   Benadryl [Diphenhydramine Hcl] Rash    Metabolic Disorder Labs: No results found for: HGBA1C, MPG No results found for: PROLACTIN No results found for: CHOL, TRIG, HDL, CHOLHDL, VLDL, LDLCALC No results found for: TSH  Therapeutic Level Labs: No results found for: LITHIUM No results found  for: VALPROATE No results found for: CBMZ  Current Medications: Current Outpatient Medications  Medication Sig Dispense Refill   aspirin 81 MG tablet Take 81 mg by mouth daily.     carvedilol (COREG) 3.125 MG tablet Take 3.125 mg by mouth 2 (two) times daily with a meal.      Coenzyme Q10 (COQ10) 100 MG CAPS Take 200 mg by mouth daily. Pt takes 2 tablets 200 mg daily.     fenofibrate (TRICOR) 145 MG tablet Take 145 mg by mouth daily.     ferrous gluconate (FERGON) 240 (27 FE) MG tablet Take 1 tablet by mouth as needed.     Flaxseed, Linseed, (FLAX SEED OIL) 1000 MG CAPS Take 1 capsule by mouth daily.     Garlic 1000 MG CAPS Take 1 each by mouth daily.     Multiple Vitamin (MULTIVITAMIN) tablet Take 1 tablet by mouth daily.     Omega-3 Fatty Acids (FISH OIL) 1000 MG CAPS Take 1,000 mg by mouth as needed.     risperiDONE  (RISPERDAL ) 0.5 MG tablet Take 1 tablet (0.5 mg total) by mouth daily. 90 tablet 1   rosuvastatin (CRESTOR) 20 MG tablet Take 20 mg by mouth daily.  3   sertraline  (ZOLOFT ) 100 MG tablet Take 1 tablet (100 mg total) by mouth daily. 90 tablet 1   No current facility-administered medications for this visit.     Musculoskeletal: Strength & Muscle Tone: within normal limits Gait & Station: normal Patient leans: N/A. Psychiatric Specialty Exam: Physical Exam  Review of Systems  Blood pressure 119/68, pulse 70, height 5' 8 (1.727 m), weight 185 lb (83.9 kg).Body mass index is 28.13 kg/m.  General Appearance: Fairly Groomed  Eye Contact:  Fair  Speech:  fast and pressured at times  Volume:  Increased  Mood:  Euphoric  Affect:  Labile  Thought Process:  Descriptions of Associations: Intact  Orientation:  Full (Time, Place, and Person)  Thought Content:  WDL  Suicidal Thoughts:  No  Homicidal Thoughts:  No  Memory:  Immediate;   Fair Recent;   Fair Remote;   Fair  Judgement:  Intact  Insight:  Present  Psychomotor Activity:  Normal  Concentration:   Concentration: Fair and Attention Span: Fair  Recall:  Good  Fund of Knowledge:  Good  Language:  Good  Akathisia:  No  Handed:  Right  AIMS (if indicated):     Assets:  Communication Skills Desire for Improvement Financial Resources/Insurance Housing Leisure  Time Physical Health Resilience Social Support  ADL's:  Intact  Cognition:  WNL  Sleep:   good      Screenings: Flowsheet Row Video [facility]06[facility] Video Visit from 02/02/2021 in [facility] Video Visit from 11/02/2020 in [facility]  C-SSRS RISK CATEGORY No Risk No Risk No Risk     Assessment and Plan: Patient is 78 year old single retired man with history of schizoaffective disorder, bipolar type and anxiety.  Currently stable on his current medication.  His primary care is Dr. Okey.  His blood sugar is stable and he monitor his blood sugar reading regularly.  He does not want to change the medication since they are working very well.  He used to take higher dose of risperidone  but slowly and gradually dose has been reduced.  Will keep the risperidone  0.5 mg at bedtime and Zoloft  100 mg daily.  Recommend to call back if is any question or any concern.  Follow-up in 6 months.  Collaboration of Care: Collaboration of Care: Other provider involved in patient's care AEB notes are available in epic to review  Patient/Guardian was advised Release of Information must be obtained prior to any record release in order to collaborate their care with an outside provider. Patient/Guardian was advised if they have not already done so to contact the registration department to sign all necessary forms in order for us  to release information regarding their care.   Consent: Patient/Guardian gives verbal consent for treatment and assignment of benefits for services provided during this visit. Patient/Guardian  expressed understanding and agreed to proceed.   Total encounter time 20 minutes which includes face-to-face time, chart review, care coordination, order entry and documentation.     Leni ONEIDA Client, MD 05/06/2024, 10:19 AM

## 2024-09-01 ENCOUNTER — Ambulatory Visit: Admitting: Podiatry

## 2024-09-21 ENCOUNTER — Ambulatory Visit: Admitting: Podiatry

## 2024-10-19 ENCOUNTER — Ambulatory Visit: Admitting: Cardiology

## 2024-11-04 ENCOUNTER — Ambulatory Visit (HOSPITAL_COMMUNITY): Admitting: Psychiatry

## 2024-12-22 ENCOUNTER — Encounter (INDEPENDENT_AMBULATORY_CARE_PROVIDER_SITE_OTHER): Admitting: Ophthalmology
